# Patient Record
Sex: Female | Born: 1980
Health system: Southern US, Community
[De-identification: ages and names within clinical notes are randomized; demographics above are authoritative.]

## PROBLEM LIST (undated history)

## (undated) DIAGNOSIS — D649 Anemia, unspecified: Secondary | ICD-10-CM

## (undated) DIAGNOSIS — I10 Essential (primary) hypertension: Secondary | ICD-10-CM

## (undated) DIAGNOSIS — B20 Human immunodeficiency virus [HIV] disease: Secondary | ICD-10-CM

## (undated) DIAGNOSIS — Z21 Asymptomatic human immunodeficiency virus [HIV] infection status: Secondary | ICD-10-CM

## (undated) DIAGNOSIS — B159 Hepatitis A without hepatic coma: Secondary | ICD-10-CM

## (undated) HISTORY — DX: Anemia, unspecified: D64.9

## (undated) HISTORY — DX: Essential (primary) hypertension: I10

---

## 2005-11-28 ENCOUNTER — Ambulatory Visit: Payer: Self-pay | Admitting: Infectious Diseases

## 2005-12-21 ENCOUNTER — Encounter: Admission: RE | Admit: 2005-12-21 | Discharge: 2005-12-21 | Payer: Self-pay | Admitting: Infectious Diseases

## 2005-12-21 ENCOUNTER — Ambulatory Visit: Payer: Self-pay | Admitting: Infectious Diseases

## 2006-03-07 ENCOUNTER — Emergency Department (HOSPITAL_COMMUNITY): Admission: EM | Admit: 2006-03-07 | Discharge: 2006-03-08 | Payer: Self-pay | Admitting: Emergency Medicine

## 2006-07-24 ENCOUNTER — Encounter: Admission: RE | Admit: 2006-07-24 | Discharge: 2006-07-24 | Payer: Self-pay | Admitting: Infectious Diseases

## 2006-07-24 ENCOUNTER — Ambulatory Visit: Payer: Self-pay | Admitting: Infectious Diseases

## 2006-07-24 ENCOUNTER — Encounter (INDEPENDENT_AMBULATORY_CARE_PROVIDER_SITE_OTHER): Payer: Self-pay | Admitting: *Deleted

## 2006-07-24 LAB — CONVERTED CEMR LAB: CD4 Count: 180 microliters

## 2006-08-30 DIAGNOSIS — B20 Human immunodeficiency virus [HIV] disease: Secondary | ICD-10-CM

## 2006-12-12 DIAGNOSIS — A150 Tuberculosis of lung: Secondary | ICD-10-CM | POA: Insufficient documentation

## 2006-12-12 DIAGNOSIS — K089 Disorder of teeth and supporting structures, unspecified: Secondary | ICD-10-CM

## 2006-12-12 DIAGNOSIS — Z9119 Patient's noncompliance with other medical treatment and regimen: Secondary | ICD-10-CM

## 2006-12-18 ENCOUNTER — Encounter (INDEPENDENT_AMBULATORY_CARE_PROVIDER_SITE_OTHER): Payer: Self-pay | Admitting: *Deleted

## 2006-12-18 LAB — CONVERTED CEMR LAB: HIV 1 RNA Quant: 399 copies/mL

## 2007-03-12 ENCOUNTER — Encounter: Admission: RE | Admit: 2007-03-12 | Discharge: 2007-03-12 | Payer: Self-pay | Admitting: Infectious Diseases

## 2007-03-12 ENCOUNTER — Ambulatory Visit: Payer: Self-pay | Admitting: Infectious Diseases

## 2007-03-12 LAB — CONVERTED CEMR LAB
CD4 Count: 230 microliters
Chloride: 106 meq/L (ref 96–112)
HCT: 35 % — ABNORMAL LOW (ref 36.0–46.0)
HIV 1 RNA Quant: <50 copies/mL (ref <50)
HIV-1 RNA Quant, Log: <1.7 (ref <1.70)
MCV: 83.7 fL (ref 78.0–100.0)
Platelets: 184 10*3/uL (ref 150–400)
Potassium: 4 meq/L (ref 3.5–5.3)
RBC: 4.18 M/uL (ref 3.87–5.11)
RDW: 13.7 % (ref 11.5–14.0)
Total Bilirubin: 0.3 mg/dL (ref 0.3–1.2)
Total Protein: 9.4 g/dL — ABNORMAL HIGH (ref 6.0–8.3)
VLDL: 12 mg/dL (ref 0–40)
WBC: 2.7 10*3/uL — ABNORMAL LOW (ref 4.0–10.5)

## 2007-07-09 ENCOUNTER — Encounter: Payer: Self-pay | Admitting: Infectious Diseases

## 2007-08-17 ENCOUNTER — Encounter (INDEPENDENT_AMBULATORY_CARE_PROVIDER_SITE_OTHER): Payer: Self-pay | Admitting: *Deleted

## 2007-08-17 LAB — CONVERTED CEMR LAB: Pap Smear: ABNORMAL

## 2007-09-05 ENCOUNTER — Encounter: Admission: RE | Admit: 2007-09-05 | Discharge: 2007-09-05 | Payer: Self-pay | Admitting: Infectious Diseases

## 2007-09-05 ENCOUNTER — Ambulatory Visit: Payer: Self-pay | Admitting: Infectious Diseases

## 2007-09-05 ENCOUNTER — Telehealth: Payer: Self-pay | Admitting: Infectious Diseases

## 2007-09-05 LAB — CONVERTED CEMR LAB
ALT: 11 units/L (ref 0–35)
Alkaline Phosphatase: 51 units/L (ref 39–117)
CO2: 23 meq/L (ref 19–32)
Chloride: 102 meq/L (ref 96–112)
Creatinine, Ser: 0.64 mg/dL (ref 0.40–1.20)
Glucose, Bld: 84 mg/dL (ref 70–99)
HCT: 38.2 % (ref 36.0–46.0)
Hemoglobin: 12.1 g/dL (ref 12.0–15.0)
MCHC: 31.7 g/dL (ref 30.0–36.0)
MCV: 84.5 fL (ref 78.0–100.0)
Platelets: 177 10*3/uL (ref 150–400)
RBC: 4.52 M/uL (ref 3.87–5.11)
RDW: 13.8 % (ref 11.5–15.5)
Sodium: 135 meq/L (ref 135–145)
WBC: 2.9 10*3/uL — ABNORMAL LOW (ref 4.0–10.5)

## 2008-07-11 ENCOUNTER — Telehealth: Payer: Self-pay

## 2008-08-18 ENCOUNTER — Encounter (INDEPENDENT_AMBULATORY_CARE_PROVIDER_SITE_OTHER): Payer: Self-pay | Admitting: *Deleted

## 2008-08-18 ENCOUNTER — Ambulatory Visit: Payer: Self-pay | Admitting: Infectious Diseases

## 2008-08-18 LAB — CONVERTED CEMR LAB
AST: 26 units/L (ref 0–37)
Albumin: 3.9 g/dL (ref 3.5–5.2)
Alkaline Phosphatase: 54 units/L (ref 39–117)
Basophils Relative: 1 % (ref 0–1)
Calcium: 9.8 mg/dL (ref 8.4–10.5)
Creatinine, Ser: 0.65 mg/dL (ref 0.40–1.20)
Eosinophils Absolute: 0.1 10*3/uL (ref 0.0–0.7)
Glucose, Bld: 94 mg/dL (ref 70–99)
HCT: 35.8 % — ABNORMAL LOW (ref 36.0–46.0)
HIV-1 RNA Quant, Log: 1.72 — ABNORMAL HIGH (ref <1.70)
Hemoglobin: 11.6 g/dL — ABNORMAL LOW (ref 12.0–15.0)
Lymphocytes Relative: 35 % (ref 12–46)
Neutro Abs: 2 10*3/uL (ref 1.7–7.7)
Neutrophils Relative %: 51 % (ref 43–77)
Platelets: 173 10*3/uL (ref 150–400)
Potassium: 4.5 meq/L (ref 3.5–5.3)
Protein, ur: NEGATIVE mg/dL
Sodium: 133 meq/L — ABNORMAL LOW (ref 135–145)
Total Protein: 9.5 g/dL — ABNORMAL HIGH (ref 6.0–8.3)
Urine Glucose: NEGATIVE mg/dL
Urobilinogen, UA: 0.2 (ref 0.0–1.0)
pH: 6 (ref 5.0–8.0)

## 2008-08-19 ENCOUNTER — Encounter: Payer: Self-pay | Admitting: Infectious Diseases

## 2008-08-27 ENCOUNTER — Encounter: Payer: Self-pay | Admitting: Infectious Diseases

## 2008-09-01 ENCOUNTER — Ambulatory Visit: Payer: Self-pay | Admitting: Infectious Diseases

## 2009-02-03 ENCOUNTER — Ambulatory Visit: Payer: Self-pay | Admitting: Infectious Diseases

## 2009-02-03 LAB — CONVERTED CEMR LAB
ALT: 8 units/L (ref 0–35)
Albumin: 3.7 g/dL (ref 3.5–5.2)
BUN: 9 mg/dL (ref 6–23)
Basophils Absolute: 0 10*3/uL (ref 0.0–0.1)
Basophils Relative: 1 % (ref 0–1)
CO2: 23 meq/L (ref 19–32)
Calcium: 9.3 mg/dL (ref 8.4–10.5)
Chloride: 105 meq/L (ref 96–112)
Cholesterol: 163 mg/dL (ref 0–200)
Creatinine, Ser: 0.71 mg/dL (ref 0.40–1.20)
Eosinophils Relative: 2 % (ref 0–5)
GFR calc Af Amer: >60 mL/min (ref >60)
GFR calc non Af Amer: >60 mL/min (ref >60)
HCT: 34.7 % — ABNORMAL LOW (ref 36.0–46.0)
HIV 1 RNA Quant: 79 copies/mL — ABNORMAL HIGH (ref <48)
HIV-1 RNA Quant, Log: 1.9 — ABNORMAL HIGH (ref <1.68)
LDL Cholesterol: 99 mg/dL (ref 0–99)
Lymphs Abs: 0.9 10*3/uL (ref 0.7–4.0)
MCV: 83.8 fL (ref 78.0–100.0)
Potassium: 4.2 meq/L (ref 3.5–5.3)
RDW: 13.5 % (ref 11.5–15.5)
Triglycerides: 42 mg/dL (ref <150)
WBC: 2 10*3/uL — ABNORMAL LOW (ref 4.0–10.5)

## 2009-02-11 ENCOUNTER — Ambulatory Visit: Payer: Self-pay | Admitting: Infectious Diseases

## 2009-02-12 ENCOUNTER — Encounter (INDEPENDENT_AMBULATORY_CARE_PROVIDER_SITE_OTHER): Payer: Self-pay | Admitting: *Deleted

## 2009-03-03 ENCOUNTER — Encounter: Payer: Self-pay | Admitting: Infectious Diseases

## 2009-03-03 ENCOUNTER — Encounter (INDEPENDENT_AMBULATORY_CARE_PROVIDER_SITE_OTHER): Payer: Self-pay | Admitting: *Deleted

## 2009-03-03 LAB — CONVERTED CEMR LAB: Pap Smear: ABNORMAL

## 2009-06-10 ENCOUNTER — Ambulatory Visit: Payer: Self-pay | Admitting: Infectious Diseases

## 2009-06-10 LAB — CONVERTED CEMR LAB
AST: 20 units/L (ref 0–37)
Alkaline Phosphatase: 53 units/L (ref 39–117)
Basophils Absolute: 0 10*3/uL (ref 0.0–0.1)
Basophils Relative: 1 % (ref 0–1)
CO2: 24 meq/L (ref 19–32)
Calcium: 9 mg/dL (ref 8.4–10.5)
Creatinine, Ser: 0.73 mg/dL (ref 0.40–1.20)
HCT: 34.8 % — ABNORMAL LOW (ref 36.0–46.0)
HIV 1 RNA Quant: 218 copies/mL — ABNORMAL HIGH (ref <48)
Hemoglobin: 11 g/dL — ABNORMAL LOW (ref 12.0–15.0)
Lymphs Abs: 0.7 10*3/uL (ref 0.7–4.0)
MCV: 84.1 fL (ref >=78.0)
Neutro Abs: 0.9 10*3/uL — ABNORMAL LOW (ref 1.7–7.7)
Neutrophils Relative %: 46 % (ref 43–77)
RBC: 4.14 M/uL (ref 3.87–5.11)
RDW: 14.4 % (ref 11.5–15.5)
Sodium: 136 meq/L (ref 135–145)
Total Bilirubin: 0.2 mg/dL — ABNORMAL LOW (ref 0.3–1.2)

## 2009-10-20 ENCOUNTER — Encounter (INDEPENDENT_AMBULATORY_CARE_PROVIDER_SITE_OTHER): Payer: Self-pay | Admitting: *Deleted

## 2009-11-18 ENCOUNTER — Ambulatory Visit: Payer: Self-pay | Admitting: Infectious Diseases

## 2009-11-18 LAB — CONVERTED CEMR LAB
ALT: 13 units/L (ref 0–35)
AST: 20 units/L (ref 0–37)
Albumin: 3.5 g/dL (ref 3.5–5.2)
Alkaline Phosphatase: 56 units/L (ref 39–117)
BUN: 8 mg/dL (ref 6–23)
Basophils Absolute: 0 10*3/uL (ref 0.0–0.1)
Basophils Relative: 1 % (ref 0–1)
CO2: 24 meq/L (ref 19–32)
Chloride: 104 meq/L (ref 96–112)
Eosinophils Absolute: 0.1 10*3/uL (ref 0.0–0.7)
Eosinophils Relative: 2 % (ref 0–5)
Glucose, Bld: 85 mg/dL (ref 70–99)
HCT: 33.1 % — ABNORMAL LOW (ref 36.0–46.0)
HIV 1 RNA Quant: 170 copies/mL — ABNORMAL HIGH (ref <48)
Lymphocytes Relative: 45 % (ref 12–46)
MCHC: 32.3 g/dL (ref 30.0–36.0)
Monocytes Relative: 21 % — ABNORMAL HIGH (ref 3–12)
Neutro Abs: 0.8 10*3/uL — ABNORMAL LOW (ref 1.7–7.7)
Neutrophils Relative %: 32 % — ABNORMAL LOW (ref 43–77)
Potassium: 4.2 meq/L (ref 3.5–5.3)
Sodium: 134 meq/L — ABNORMAL LOW (ref 135–145)
Total Bilirubin: 0.3 mg/dL (ref 0.3–1.2)
WBC: 2.5 10*3/uL — ABNORMAL LOW (ref 4.0–10.5)

## 2010-03-01 ENCOUNTER — Ambulatory Visit (HOSPITAL_COMMUNITY): Admission: RE | Admit: 2010-03-01 | Discharge: 2010-03-01 | Payer: Self-pay | Admitting: Infectious Diseases

## 2010-03-01 ENCOUNTER — Ambulatory Visit: Payer: Self-pay | Admitting: Infectious Diseases

## 2010-03-01 LAB — CONVERTED CEMR LAB
ALT: 13 units/L (ref 0–35)
AST: 21 units/L (ref 0–37)
Albumin: 3.5 g/dL (ref 3.5–5.2)
Alkaline Phosphatase: 62 units/L (ref 39–117)
BUN: 9 mg/dL (ref 6–23)
Basophils Absolute: 0 10*3/uL (ref 0.0–0.1)
Basophils Relative: 1 % (ref 0–1)
CO2: 23 meq/L (ref 19–32)
Chloride: 102 meq/L (ref 96–112)
Creatinine, Ser: 0.68 mg/dL (ref 0.40–1.20)
Eosinophils Relative: 3 % (ref 0–5)
Glucose, Bld: 74 mg/dL (ref 70–99)
Lymphs Abs: 1.1 10*3/uL (ref 0.7–4.0)
MCV: 83.7 fL (ref 78.0–100.0)
Monocytes Absolute: 0.5 10*3/uL (ref 0.1–1.0)
Neutro Abs: 1.2 10*3/uL — ABNORMAL LOW (ref 1.7–7.7)
Platelets: 188 10*3/uL (ref 150–400)
Potassium: 3.7 meq/L (ref 3.5–5.3)
RBC: 4.11 M/uL (ref 3.87–5.11)
RDW: 14 % (ref 11.5–15.5)
Sodium: 134 meq/L — ABNORMAL LOW (ref 135–145)
Total Bilirubin: 0.2 mg/dL — ABNORMAL LOW (ref 0.3–1.2)
WBC: 2.9 10*3/uL — ABNORMAL LOW (ref 4.0–10.5)

## 2010-03-26 ENCOUNTER — Telehealth (INDEPENDENT_AMBULATORY_CARE_PROVIDER_SITE_OTHER): Payer: Self-pay | Admitting: *Deleted

## 2010-03-26 ENCOUNTER — Encounter (INDEPENDENT_AMBULATORY_CARE_PROVIDER_SITE_OTHER): Payer: Self-pay | Admitting: *Deleted

## 2010-09-30 ENCOUNTER — Emergency Department (HOSPITAL_COMMUNITY): Admission: EM | Admit: 2010-09-30 | Discharge: 2010-02-04 | Payer: Self-pay | Admitting: Emergency Medicine

## 2010-11-23 NOTE — Progress Notes (Signed)
Summary: reminder to call [facility] to f/u abnl PAP  Phone Note Outgoing Call   Call placed by: Jennet Maduro RN,  March 26, 2010 12:48 PM Call placed to: Patient Action Taken: Phone Call Completed Summary of Call: Message left reminding pt. to make appt. @ [facility] for annual appt and abnl PAP f/u. Jennet Maduro RN  March 26, 2010 12:49 PM

## 2010-11-23 NOTE — Assessment & Plan Note (Signed)
Summary: CHECKUP/SB.   CC:  check up.  History of Present Illness: 30 yo F with hx of HIV-2+, abn PAP and PPD+. On ATVr/TRV.   CD4 220, VL 170 (11-18-09).  feeling well. has not made f/uw tih GYN yet for prev abn pap. no problems with meds, no missed. recent course of cipro for UTI. still has some R lower abd/back pain and swelling. no dysuria. nl BM.   Preventive Screening-Counseling & Management  Alcohol-Tobacco     Alcohol drinks/day: 0     Smoking Status: never     Passive Smoke Exposure: no  Caffeine-Diet-Exercise     Caffeine use/day: sodas occassionally     Does Patient Exercise: yes     Type of exercise: active at work     Exercise (avg: min/session): >60     Times/week: 6   Updated Prior Medication List: NORVIR 100 MG CAPS (RITONAVIR) Take 1 capsule by mouth once a day REYATAZ 300 MG CAPS (ATAZANAVIR SULFATE) Take 1 capsule by mouth once a day TRUVADA 200-300 MG TABS (EMTRICITABINE-TENOFOVIR) Take 1 tablet by mouth once a day PRILOSEC 20 MG CPDR (OMEPRAZOLE) Take 1 tablet by mouth two times a day IRON 325 (65 FE) MG TABS (FERROUS SULFATE) Take 1 tablet by mouth once a day]  Current Allergies (reviewed today): No known allergies  Past History:  Past medical, surgical, family and social histories (including risk factors) reviewed, and no changes noted (except as noted below).  Past Medical History: Reviewed history from 08/30/2006 and no changes required. HIV disease Latent tuberculosis  Family History: Reviewed history from 09/01/2008 and no changes required. no problems  Social History: Reviewed history from 08/30/2006 and no changes required. Never Smoked Alcohol use-no From [place], been in [place] since Feb 2007.   Current Medications (verified): 1)  Norvir 100 Mg Caps (Ritonavir) .... Take 1 Capsule By Mouth Once A Day 2)  Reyataz 300 Mg Caps (Atazanavir Sulfate) .... Take 1 Capsule By Mouth Once A Day 3)  Truvada 200-300 Mg Tabs  (Emtricitabine-Tenofovir) .... Take 1 Tablet By Mouth Once A Day 4)  Prilosec 20 Mg Cpdr (Omeprazole) .... Take 1 Tablet By Mouth Two Times A Day 5)  Iron 325 (65 Fe) Mg Tabs (Ferrous Sulfate) .... Take 1 Tablet By Mouth Once A Day]  Allergies (verified): No Known Drug Allergies   Review of Systems       occas fevers. wt up 5#  Vital Signs:  Patient profile:   30 year old female Height:      64 inches (162.56 cm) Weight:      133.4 pounds (60.64 kg) BMI:     22.98 Temp:     98.1 degrees F (36.72 degrees C) oral Pulse rate:   99 / minute BP sitting:   115 / 80  (left arm)  Vitals Entered By: Baxter Hire) (Mar 01, 2010 10:50 AM) CC: check up Pain Assessment Patient in pain? yes     Location: lower back Intensity: okay right now Type: aching Onset of pain  pain comes and goes Nutritional Status BMI of 19 -24 = normal Nutritional Status Detail appetite is good per patient  Does patient need assistance? Functional Status Self care Ambulation Normal        Medication Adherence: 03/01/2010   Adherence to medications reviewed with patient. Counseling to provide adequate adherence provided   Prevention For Positives: 03/01/2010   Safe sex practices discussed with patient. Condoms offered.  Physical Exam  General:  well-developed, well-nourished, and well-hydrated.   Eyes:  pupils equal, pupils round, and pupils reactive to light.   Mouth:  pharynx pink and moist, no exudates, and poor dentition.   Neck:  no masses.   Lungs:  normal respiratory effort and normal breath sounds.   Heart:  normal rate, regular rhythm, and no murmur.   Abdomen:  soft, non-tender, and normal bowel sounds.  no flank pain or swelling on R.    Impression & Recommendations:  Problem # 1:  HIV DISEASE (ICD-042)  she is doing well. will rechek her labs today. will give her condoms. asked ehr to take ATVr 12 hours apart from prilosec. will check a  plain film of her LS spine to try and further illucidate her back pain. return to clinic 4 months.   Orders: Radiology other (Radiology Other)  Problem # 2:  DENTAL CARIES (ICD-521.00) will try to get her into dental.   Problem # 3:  ABFND, PAP SMEAR, CERVIX, AS-CUS (ICD-795.01) will encourage her to make her f/u apt.   Other Orders: T-CD4SP [facility]) (CD4SP) T-HIV Viral Load [phone]) T-Comprehensive Metabolic Panel [phone]) T-CBC w/Diff [phone]) Est. Patient Level IV (13244)  Appended Document: Orders Update    Clinical Lists Changes  Orders: Added new Test order of T-Hepatitis A Antibody ([id number]) - Signed      Appended Document: Orders Update    Clinical Lists Changes  Orders: Added new Test order of Radiology other (Radiology Other) - Signed

## 2010-11-23 NOTE — Miscellaneous (Signed)
Summary: Update Problem LIst  Clinical Lists Changes  Problems: Removed problem of ABFND, PAP SMEAR, CERVIX, AS-CUS (ICD-795.01) Added new problem of DYSPLASIA OF CERVIX UNSPECIFIED (ICD-622.10)

## 2010-11-23 NOTE — Consult Note (Signed)
Summary: [facility]  [facility]   Imported By: Florinda Marker 11/04/2009 15:35:15  _____________________________________________________________________  External Attachment:    Type:   Image     Comment:   External Document

## 2010-11-23 NOTE — Miscellaneous (Signed)
Summary: Orders Update - labs  Clinical Lists Changes  Problems: Added new problem of ENCOUNTER FOR LONG-TERM USE OF OTHER MEDICATIONS (ICD-V58.69) Orders: Added new Test order of T-CBC w/Diff [phone]) - Signed Added new Test order of T-CD4SP [facility]) (CD4SP) - Signed Added new Test order of T-Comprehensive Metabolic Panel [phone]) - Signed Added new Test order of T-HIV Viral Load [phone]) - Signed Added new Test order of T-RPR (Syphilis) [phone]) - Signed Added new Test order of T-Lipid Profile ([id number]) - Signed     Process Orders Check Orders Results:     [facility]: ABN not required for this insurance Order queued for requisitioning for [facility]: November 18, 2009 10:11 AM  Tests Sent for requisitioning (November 18, 2009 10:11 AM):     11/18/2009: [facility] -- T-CBC w/Diff [[id number]] (signed)     11/18/2009: [facility] -- T-Comprehensive Metabolic Panel [[id number]] (signed)     11/18/2009: [facility] -- T-HIV Viral Load [phone] (signed)     11/18/2009: [facility] -- T-RPR (Syphilis) [phone] (signed)     11/18/2009: [facility] -- T-Lipid Profile [phone] (signed)

## 2010-12-14 ENCOUNTER — Encounter (INDEPENDENT_AMBULATORY_CARE_PROVIDER_SITE_OTHER): Payer: Self-pay | Admitting: Licensed Clinical Social Worker

## 2010-12-21 NOTE — Miscellaneous (Signed)
  Clinical Lists Changes  Observations: Added new observation of FLU VAX: Historical (10/08/2010 10:34)      Immunization History:  Influenza Immunization History:    Influenza:  historical (10/08/2010)

## 2011-01-09 LAB — T-HELPER CELL (CD4) - (RCID CLINIC ONLY)
CD4 % Helper T Cell: 22 % — ABNORMAL LOW (ref 33–55)
CD4 T Cell Abs: 220 uL — ABNORMAL LOW (ref 400–2700)

## 2011-01-11 LAB — T-HELPER CELL (CD4) - (RCID CLINIC ONLY): CD4 % Helper T Cell: 19 % — ABNORMAL LOW (ref 33–55)

## 2011-01-12 LAB — BASIC METABOLIC PANEL
CO2: 25 mEq/L (ref 19–32)
Calcium: 9.2 mg/dL (ref 8.4–10.5)
Creatinine, Ser: 0.76 mg/dL (ref 0.4–1.2)
Potassium: 3.7 mEq/L (ref 3.5–5.1)

## 2011-01-12 LAB — WET PREP, GENITAL
Trich, Wet Prep: NONE SEEN
Yeast Wet Prep HPF POC: NONE SEEN

## 2011-01-12 LAB — URINALYSIS, ROUTINE W REFLEX MICROSCOPIC
Bilirubin Urine: NEGATIVE
Glucose, UA: NEGATIVE mg/dL
Nitrite: NEGATIVE
Protein, ur: NEGATIVE mg/dL
Specific Gravity, Urine: 1.017 (ref 1.005–1.030)
pH: 6 (ref 5.0–8.0)

## 2011-01-12 LAB — CBC
HCT: 31.7 % — ABNORMAL LOW (ref 36.0–46.0)
Hemoglobin: 10.8 g/dL — ABNORMAL LOW (ref 12.0–15.0)
MCHC: 34.1 g/dL (ref 30.0–36.0)
Platelets: 167 10*3/uL (ref 150–400)
RBC: 3.73 MIL/uL — ABNORMAL LOW (ref 3.87–5.11)
RDW: 13.8 % (ref 11.5–15.5)

## 2011-01-12 LAB — DIFFERENTIAL
Basophils Absolute: 0 10*3/uL (ref 0.0–0.1)
Lymphocytes Relative: 46 % (ref 12–46)
Monocytes Relative: 18 % — ABNORMAL HIGH (ref 3–12)
Neutro Abs: 1 10*3/uL — ABNORMAL LOW (ref 1.7–7.7)

## 2011-01-12 LAB — POCT PREGNANCY, URINE: Preg Test, Ur: NEGATIVE

## 2011-01-12 LAB — HEPATIC FUNCTION PANEL
AST: 28 U/L (ref 0–37)
Bilirubin, Direct: <0.1 mg/dL (ref 0.0–0.3)
Total Protein: 9.5 g/dL — ABNORMAL HIGH (ref 6.0–8.3)

## 2011-01-12 LAB — URINE MICROSCOPIC-ADD ON

## 2011-01-12 LAB — GC/CHLAMYDIA PROBE AMP, GENITAL: GC Probe Amp, Genital: NEGATIVE

## 2011-01-29 LAB — T-HELPER CELL (CD4) - (RCID CLINIC ONLY): CD4 T Cell Abs: 220 uL — ABNORMAL LOW (ref 400–2700)

## 2011-02-02 LAB — T-HELPER CELL (CD4) - (RCID CLINIC ONLY): CD4 % Helper T Cell: 28 % — ABNORMAL LOW (ref 33–55)

## 2011-05-24 ENCOUNTER — Other Ambulatory Visit (HOSPITAL_COMMUNITY)
Admission: RE | Admit: 2011-05-24 | Discharge: 2011-05-24 | Disposition: A | Discharge status: Home / Self Care | Payer: PRIVATE HEALTH INSURANCE | Source: Ambulatory Visit | Attending: Family Medicine | Admitting: Family Medicine

## 2011-05-24 ENCOUNTER — Other Ambulatory Visit: Payer: Self-pay | Admitting: Family Medicine

## 2011-05-24 DIAGNOSIS — R87810 Cervical high risk human papillomavirus (HPV) DNA test positive: Secondary | ICD-10-CM | POA: Insufficient documentation

## 2011-05-24 DIAGNOSIS — Z113 Encounter for screening for infections with a predominantly sexual mode of transmission: Secondary | ICD-10-CM | POA: Insufficient documentation

## 2011-05-24 DIAGNOSIS — Z124 Encounter for screening for malignant neoplasm of cervix: Secondary | ICD-10-CM | POA: Insufficient documentation

## 2011-06-02 ENCOUNTER — Other Ambulatory Visit: Payer: Self-pay | Admitting: Family Medicine

## 2011-07-05 ENCOUNTER — Other Ambulatory Visit: Payer: Self-pay | Admitting: Obstetrics and Gynecology

## 2011-07-25 LAB — T-HELPER CELL (CD4) - (RCID CLINIC ONLY)
CD4 % Helper T Cell: 22 — ABNORMAL LOW
CD4 T Cell Abs: 270 — ABNORMAL LOW

## 2011-08-02 LAB — T-HELPER CELL (CD4) - (RCID CLINIC ONLY): CD4 % Helper T Cell: 18 — ABNORMAL LOW

## 2011-09-01 ENCOUNTER — Other Ambulatory Visit: Payer: Self-pay | Admitting: Obstetrics and Gynecology

## 2015-11-27 ENCOUNTER — Inpatient Hospital Stay (HOSPITAL_COMMUNITY)
Admission: EM | Admit: 2015-11-27 | Discharge: 2015-12-03 | DRG: 976 | Disposition: A | Discharge status: Home / Self Care | Payer: BLUE CROSS/BLUE SHIELD | Attending: Internal Medicine | Admitting: Internal Medicine

## 2015-11-27 ENCOUNTER — Emergency Department (HOSPITAL_COMMUNITY): Payer: BLUE CROSS/BLUE SHIELD

## 2015-11-27 ENCOUNTER — Encounter (HOSPITAL_COMMUNITY): Payer: Self-pay | Admitting: Emergency Medicine

## 2015-11-27 ENCOUNTER — Inpatient Hospital Stay (HOSPITAL_COMMUNITY): Payer: BLUE CROSS/BLUE SHIELD

## 2015-11-27 DIAGNOSIS — A312 Disseminated mycobacterium avium-intracellulare complex (DMAC): Secondary | ICD-10-CM | POA: Diagnosis present

## 2015-11-27 DIAGNOSIS — M549 Dorsalgia, unspecified: Secondary | ICD-10-CM | POA: Diagnosis present

## 2015-11-27 DIAGNOSIS — K829 Disease of gallbladder, unspecified: Secondary | ICD-10-CM

## 2015-11-27 DIAGNOSIS — B20 Human immunodeficiency virus [HIV] disease: Principal | ICD-10-CM | POA: Diagnosis present

## 2015-11-27 DIAGNOSIS — R945 Abnormal results of liver function studies: Secondary | ICD-10-CM

## 2015-11-27 DIAGNOSIS — K819 Cholecystitis, unspecified: Secondary | ICD-10-CM

## 2015-11-27 DIAGNOSIS — B9735 Human immunodeficiency virus, type 2 [HIV 2] as the cause of diseases classified elsewhere: Secondary | ICD-10-CM | POA: Insufficient documentation

## 2015-11-27 DIAGNOSIS — B373 Candidiasis of vulva and vagina: Secondary | ICD-10-CM | POA: Diagnosis present

## 2015-11-27 DIAGNOSIS — R935 Abnormal findings on diagnostic imaging of other abdominal regions, including retroperitoneum: Secondary | ICD-10-CM

## 2015-11-27 DIAGNOSIS — R109 Unspecified abdominal pain: Secondary | ICD-10-CM

## 2015-11-27 DIAGNOSIS — R509 Fever, unspecified: Secondary | ICD-10-CM

## 2015-11-27 DIAGNOSIS — M791 Myalgia: Secondary | ICD-10-CM | POA: Diagnosis present

## 2015-11-27 DIAGNOSIS — Z9119 Patient's noncompliance with other medical treatment and regimen: Secondary | ICD-10-CM | POA: Diagnosis not present

## 2015-11-27 DIAGNOSIS — B370 Candidal stomatitis: Secondary | ICD-10-CM | POA: Diagnosis present

## 2015-11-27 DIAGNOSIS — B3731 Acute candidiasis of vulva and vagina: Secondary | ICD-10-CM

## 2015-11-27 DIAGNOSIS — R7989 Other specified abnormal findings of blood chemistry: Secondary | ICD-10-CM | POA: Diagnosis present

## 2015-11-27 DIAGNOSIS — Z21 Asymptomatic human immunodeficiency virus [HIV] infection status: Secondary | ICD-10-CM

## 2015-11-27 DIAGNOSIS — Z8611 Personal history of tuberculosis: Secondary | ICD-10-CM

## 2015-11-27 DIAGNOSIS — D509 Iron deficiency anemia, unspecified: Secondary | ICD-10-CM | POA: Diagnosis present

## 2015-11-27 DIAGNOSIS — D649 Anemia, unspecified: Secondary | ICD-10-CM

## 2015-11-27 DIAGNOSIS — N814 Uterovaginal prolapse, unspecified: Secondary | ICD-10-CM | POA: Diagnosis present

## 2015-11-27 DIAGNOSIS — Z79899 Other long term (current) drug therapy: Secondary | ICD-10-CM

## 2015-11-27 DIAGNOSIS — K759 Inflammatory liver disease, unspecified: Secondary | ICD-10-CM | POA: Diagnosis not present

## 2015-11-27 HISTORY — DX: Asymptomatic human immunodeficiency virus (hiv) infection status: Z21

## 2015-11-27 HISTORY — DX: Human immunodeficiency virus (HIV) disease: B20

## 2015-11-27 HISTORY — DX: Hepatitis a without hepatic coma: B15.9

## 2015-11-27 LAB — URINALYSIS, ROUTINE W REFLEX MICROSCOPIC
GLUCOSE, UA: NEGATIVE mg/dL
KETONES UR: NEGATIVE mg/dL
Nitrite: NEGATIVE
PH: 6.5 (ref 5.0–8.0)
PROTEIN: 100 mg/dL — AB
Specific Gravity, Urine: 1.025 (ref 1.005–1.030)

## 2015-11-27 LAB — URINE MICROSCOPIC-ADD ON

## 2015-11-27 LAB — COMPREHENSIVE METABOLIC PANEL
ALT: 150 U/L — AB (ref 14–54)
AST: 248 U/L — ABNORMAL HIGH (ref 15–41)
Albumin: 2.2 g/dL — ABNORMAL LOW (ref 3.5–5.0)
Alkaline Phosphatase: 261 U/L — ABNORMAL HIGH (ref 38–126)
Anion gap: 8 (ref 5–15)
BILIRUBIN TOTAL: 0.5 mg/dL (ref 0.3–1.2)
BUN: 7 mg/dL (ref 6–20)
CHLORIDE: 103 mmol/L (ref 101–111)
CO2: 20 mmol/L — ABNORMAL LOW (ref 22–32)
CREATININE: 0.8 mg/dL (ref 0.44–1.00)
Calcium: 8.5 mg/dL — ABNORMAL LOW (ref 8.9–10.3)
Glucose, Bld: 151 mg/dL — ABNORMAL HIGH (ref 65–99)
Potassium: 3.2 mmol/L — ABNORMAL LOW (ref 3.5–5.1)
Sodium: 131 mmol/L — ABNORMAL LOW (ref 135–145)
TOTAL PROTEIN: 10.8 g/dL — AB (ref 6.5–8.1)

## 2015-11-27 LAB — CBC WITH DIFFERENTIAL/PLATELET
Basophils Absolute: 0 10*3/uL (ref 0.0–0.1)
Basophils Relative: 0 %
EOS PCT: 0 %
Eosinophils Absolute: 0 10*3/uL (ref 0.0–0.7)
HEMATOCRIT: 27.6 % — AB (ref 36.0–46.0)
Hemoglobin: 9.2 g/dL — ABNORMAL LOW (ref 12.0–15.0)
LYMPHS ABS: 0.8 10*3/uL (ref 0.7–4.0)
LYMPHS PCT: 23 %
MCH: 26.3 pg (ref 26.0–34.0)
MCHC: 33.3 g/dL (ref 30.0–36.0)
MCV: 78.9 fL (ref 78.0–100.0)
MONO ABS: 0.3 10*3/uL (ref 0.1–1.0)
Monocytes Relative: 8 %
Neutro Abs: 2.3 10*3/uL (ref 1.7–7.7)
Neutrophils Relative %: 69 %
PLATELETS: 235 10*3/uL (ref 150–400)
RBC: 3.5 MIL/uL — ABNORMAL LOW (ref 3.87–5.11)
RDW: 13.9 % (ref 11.5–15.5)
WBC: 3.3 10*3/uL — ABNORMAL LOW (ref 4.0–10.5)

## 2015-11-27 LAB — CRYPTOCOCCAL ANTIGEN: CRYPTO AG: NEGATIVE

## 2015-11-27 LAB — T-HELPER CELLS (CD4) COUNT (NOT AT ARMC)
CD4 % Helper T Cell: 7 % — ABNORMAL LOW (ref 33–55)
CD4 T Cell Abs: 60 /uL — ABNORMAL LOW (ref 400–2700)

## 2015-11-27 LAB — INFLUENZA PANEL BY PCR (TYPE A & B)
H1N1 flu by pcr: NOT DETECTED
INFLBPCR: NEGATIVE
Influenza A By PCR: NEGATIVE

## 2015-11-27 LAB — I-STAT CG4 LACTIC ACID, ED
LACTIC ACID, VENOUS: 3.39 mmol/L — AB (ref 0.5–2.0)
LACTIC ACID, VENOUS: 0.77 mmol/L (ref 0.5–2.0)

## 2015-11-27 LAB — I-STAT BETA HCG BLOOD, ED (MC, WL, AP ONLY)

## 2015-11-27 MED ORDER — PIPERACILLIN-TAZOBACTAM 3.375 G IVPB 30 MIN
3.3750 g | Freq: Once | INTRAVENOUS | Status: AC
  Administered 2015-11-27: 3.375 g via INTRAVENOUS
  Filled 2015-11-27: qty 50

## 2015-11-27 MED ORDER — SODIUM CHLORIDE 0.9 % IV BOLUS (SEPSIS)
1000.0000 mL | Freq: Once | INTRAVENOUS | Status: AC
  Administered 2015-11-27: 1000 mL via INTRAVENOUS

## 2015-11-27 MED ORDER — ENOXAPARIN SODIUM 40 MG/0.4ML ~~LOC~~ SOLN
40.0000 mg | SUBCUTANEOUS | Status: DC
  Administered 2015-11-27 – 2015-11-30 (×4): 40 mg via SUBCUTANEOUS
  Filled 2015-11-27 (×4): qty 0.4

## 2015-11-27 MED ORDER — VANCOMYCIN HCL IN DEXTROSE 750-5 MG/150ML-% IV SOLN
750.0000 mg | Freq: Three times a day (TID) | INTRAVENOUS | Status: DC
  Filled 2015-11-27 (×2): qty 150

## 2015-11-27 MED ORDER — VANCOMYCIN HCL IN DEXTROSE 1-5 GM/200ML-% IV SOLN
1000.0000 mg | Freq: Once | INTRAVENOUS | Status: AC
  Administered 2015-11-27: 1000 mg via INTRAVENOUS
  Filled 2015-11-27: qty 200

## 2015-11-27 MED ORDER — POTASSIUM CHLORIDE CRYS ER 20 MEQ PO TBCR
40.0000 meq | EXTENDED_RELEASE_TABLET | Freq: Once | ORAL | Status: AC
  Administered 2015-11-27: 40 meq via ORAL
  Filled 2015-11-27: qty 2

## 2015-11-27 MED ORDER — IBUPROFEN 200 MG PO TABS
400.0000 mg | ORAL_TABLET | Freq: Four times a day (QID) | ORAL | Status: DC | PRN
  Administered 2015-11-27 – 2015-11-30 (×6): 400 mg via ORAL
  Filled 2015-11-27 (×2): qty 2
  Filled 2015-11-27: qty 1
  Filled 2015-11-27 (×3): qty 2

## 2015-11-27 MED ORDER — SULFAMETHOXAZOLE-TRIMETHOPRIM 800-160 MG PO TABS
1.0000 | ORAL_TABLET | Freq: Every day | ORAL | Status: DC
  Administered 2015-11-28 – 2015-12-03 (×6): 1 via ORAL
  Filled 2015-11-27 (×6): qty 1

## 2015-11-27 MED ORDER — ACETAMINOPHEN 325 MG PO TABS
650.0000 mg | ORAL_TABLET | Freq: Once | ORAL | Status: AC
  Administered 2015-11-27: 650 mg via ORAL
  Filled 2015-11-27: qty 2

## 2015-11-27 MED ORDER — PIPERACILLIN-TAZOBACTAM 3.375 G IVPB
3.3750 g | Freq: Three times a day (TID) | INTRAVENOUS | Status: DC
  Administered 2015-11-27 – 2015-12-01 (×12): 3.375 g via INTRAVENOUS
  Filled 2015-11-27 (×14): qty 50

## 2015-11-27 MED ORDER — SODIUM CHLORIDE 0.9 % IV SOLN
INTRAVENOUS | Status: AC
  Administered 2015-11-27: 18:00:00 via INTRAVENOUS

## 2015-11-27 MED ORDER — FLUCONAZOLE 200 MG PO TABS
200.0000 mg | ORAL_TABLET | Freq: Every day | ORAL | Status: DC
  Administered 2015-11-27 – 2015-12-01 (×5): 200 mg via ORAL
  Filled 2015-11-27 (×6): qty 1

## 2015-11-27 NOTE — Consult Note (Signed)
Reason for Consult: acute cholecystitis  Referring Physician: Dr. Oval Linsey    HPI: Allison Wilson is a 35 year old female with a history of HIV who presents with back and shoulder pain.  Onset was sudden.  Coarse is unchanged.  exacerbated when she vacuum's.  No increase with oral intake or post prandial pain.  Denies nausea or vomiting.  Endorses to fever, chills, sweats, and chills.  Denies previous symptoms.  Initial work up shows, elevated LFTs with AST/ALT 248/150, normal t bilirubin, WBC 3.3.  CD4 count of 60.  Abdominal US shows no gallstones, but gallbladder wall thickening, edema and a sonographic murphy's sign.  We have therefore been asked to evaluate.    Past Medical History  Diagnosis Date  . HIV (human immunodeficiency virus infection) ([place])   . Hepatitis A     History reviewed. No pertinent past surgical history.  History reviewed. No pertinent family history.  Social History:  reports that she has never smoked. She does not have any smokeless tobacco history on file. She reports that she does not drink alcohol or use illicit drugs.  Allergies: No Known Allergies  Medications:  Scheduled Meds: . enoxaparin (LOVENOX) injection  40 mg Subcutaneous Q24H  . piperacillin-tazobactam (ZOSYN)  IV  3.375 g Intravenous Q8H  . vancomycin  750 mg Intravenous Q8H   Continuous Infusions: . sodium chloride     PRN Meds:.ibuprofen   Results for orders placed or performed during the hospital encounter of 11/27/15 (from the past 48 hour(s))  Influenza panel by PCR (type A & B, H1N1)     Status: None   Collection Time: 11/27/15  9:01 AM  Result Value Ref Range   Influenza A By PCR NEGATIVE NEGATIVE   Influenza B By PCR NEGATIVE NEGATIVE   H1N1 flu by pcr NOT DETECTED NOT DETECTED    Comment:        The Xpert Flu assay (FDA approved for nasal aspirates or washes and nasopharyngeal swab specimens), is intended as an aid in the diagnosis of influenza and should not be used  as a sole basis for treatment.   CBC with Differential     Status: Abnormal   Collection Time: 11/27/15  9:06 AM  Result Value Ref Range   WBC 3.3 (L) 4.0 - 10.5 K/uL   RBC 3.50 (L) 3.87 - 5.11 MIL/uL   Hemoglobin 9.2 (L) 12.0 - 15.0 g/dL   HCT 27.6 (L) 36.0 - 46.0 %   MCV 78.9 78.0 - 100.0 fL   MCH 26.3 26.0 - 34.0 pg   MCHC 33.3 30.0 - 36.0 g/dL   RDW 13.9 11.5 - 15.5 %   Platelets 235 150 - 400 K/uL   Neutrophils Relative % 69 %   Neutro Abs 2.3 1.7 - 7.7 K/uL   Lymphocytes Relative 23 %   Lymphs Abs 0.8 0.7 - 4.0 K/uL   Monocytes Relative 8 %   Monocytes Absolute 0.3 0.1 - 1.0 K/uL   Eosinophils Relative 0 %   Eosinophils Absolute 0.0 0.0 - 0.7 K/uL   Basophils Relative 0 %   Basophils Absolute 0.0 0.0 - 0.1 K/uL  Comprehensive metabolic panel     Status: Abnormal   Collection Time: 11/27/15  9:06 AM  Result Value Ref Range   Sodium 131 (L) 135 - 145 mmol/L   Potassium 3.2 (L) 3.5 - 5.1 mmol/L   Chloride 103 101 - 111 mmol/L   CO2 20 (L) 22 - 32 mmol/L  Glucose, Bld 151 (H) 65 - 99 mg/dL   BUN 7 6 - 20 mg/dL   Creatinine, Ser 0.80 0.44 - 1.00 mg/dL   Calcium 8.5 (L) 8.9 - 10.3 mg/dL   Total Protein 10.8 (H) 6.5 - 8.1 g/dL   Albumin 2.2 (L) 3.5 - 5.0 g/dL   AST 248 (H) 15 - 41 U/L   ALT 150 (H) 14 - 54 U/L   Alkaline Phosphatase 261 (H) 38 - 126 U/L   Total Bilirubin 0.5 0.3 - 1.2 mg/dL   GFR calc non Af Amer >60 >60 mL/min   GFR calc Af Amer >60 >60 mL/min    Comment: (NOTE) The eGFR has been calculated using the CKD EPI equation. This calculation has not been validated in all clinical situations. eGFR's persistently <60 mL/min signify possible Chronic Kidney Disease.    Anion gap 8 5 - 15  T-helper cells (CD4) count (not at [facility])     Status: Abnormal   Collection Time: 11/27/15  9:06 AM  Result Value Ref Range   CD4 T Cell Abs 60 (L) 400 - 2700 /uL   CD4 % Helper T Cell 7 (L) 33 - 55 %    Comment: Performed at [facility]  I-Stat Beta  hCG blood, ED ([facility], [facility], [facility] only)     Status: None   Collection Time: 11/27/15  9:07 AM  Result Value Ref Range   I-stat hCG, quantitative <5.0 <5 mIU/mL   Comment 3            Comment:   GEST. AGE      CONC.  (mIU/mL)   <=1 WEEK        5 - 50     2 WEEKS       50 - 500     3 WEEKS       100 - 10,000     4 WEEKS     1,000 - 30,000        FEMALE AND NON-PREGNANT FEMALE:     LESS THAN 5 mIU/mL   I-Stat CG4 Lactic Acid, ED     Status: Abnormal   Collection Time: 11/27/15  9:09 AM  Result Value Ref Range   Lactic Acid, Venous 3.39 (HH) 0.5 - 2.0 mmol/L   Comment NOTIFIED PHYSICIAN   Urinalysis, Routine w reflex microscopic (not at [facility])     Status: Abnormal   Collection Time: 11/27/15  9:15 AM  Result Value Ref Range   Color, Urine AMBER (A) YELLOW    Comment: BIOCHEMICALS MAY BE AFFECTED BY COLOR   APPearance CLOUDY (A) CLEAR   Specific Gravity, Urine 1.025 1.005 - 1.030   pH 6.5 5.0 - 8.0   Glucose, UA NEGATIVE NEGATIVE mg/dL   Hgb urine dipstick SMALL (A) NEGATIVE   Bilirubin Urine SMALL (A) NEGATIVE   Ketones, ur NEGATIVE NEGATIVE mg/dL   Protein, ur 100 (A) NEGATIVE mg/dL   Nitrite NEGATIVE NEGATIVE   Leukocytes, UA SMALL (A) NEGATIVE  Urine microscopic-add on     Status: Abnormal   Collection Time: 11/27/15  9:15 AM  Result Value Ref Range   Squamous Epithelial / LPF 6-30 (A) NONE SEEN   WBC, UA 0-5 0 - 5 WBC/hpf   RBC / HPF 0-5 0 - 5 RBC/hpf   Bacteria, UA MANY (A) NONE SEEN   Casts GRANULAR CAST (A) NEGATIVE   Urine-Other YEAST PRESENT   I-Stat CG4 Lactic Acid, ED     Status: None  Collection Time: 11/27/15 12:08 PM  Result Value Ref Range   Lactic Acid, Venous 0.77 0.5 - 2.0 mmol/L    Dg Chest 2 View  11/27/2015  CLINICAL DATA:  Upper back and shoulder pain beginning while vacuuming 2 days ago. Initial encounter. No known injury. EXAM: CHEST  2 VIEW COMPARISON:  None. FINDINGS: The lungs are clear. Heart size is normal. No pneumothorax or pleural effusion. No focal  bony abnormality. IMPRESSION: No acute disease. Electronically Signed   By: Inge Rise M.D.   On: 11/27/2015 09:36   US Abdomen Limited Ruq  11/27/2015  CLINICAL DATA:  Elevated LFTs, abdominal pain for 2 days, HIV EXAM: US ABDOMEN LIMITED - RIGHT UPPER QUADRANT COMPARISON:  None. FINDINGS: Gallbladder: No gallstones are noted within gallbladder. There is thickening of gallbladder wall up to 5.5 mm. Edema noted gallbladder wall. There is positive sonographic Murphy sign. Findings are suspicious for early cholecystitis. Clinical correlation is necessary. Common bile duct: Diameter: 3 mm in diameter within normal limits. Liver: No focal lesion identified. Within normal limits in parenchymal echogenicity. IMPRESSION: No gallstones are noted within gallbladder. There is thickening of gallbladder wall up to 5.5 mm. Edema noted gallbladder wall. There is positive sonographic Murphy sign. Findings are suspicious for early cholecystitis. Clinical correlation is necessary. Normal CBD. Electronically Signed   By: Lahoma Crocker M.D.   On: 11/27/2015 13:08    Review of Systems  Constitutional: Positive for fever, chills and malaise/fatigue. Negative for weight loss and diaphoresis.  Eyes: Negative for blurred vision, double vision, photophobia, pain, discharge and redness.  Respiratory: Positive for shortness of breath. Negative for cough, hemoptysis, sputum production and wheezing.   Cardiovascular: Negative for chest pain, palpitations, orthopnea, claudication, leg swelling and PND.  Gastrointestinal: Positive for abdominal pain. Negative for heartburn, nausea, vomiting, diarrhea, constipation, blood in stool and melena.  Genitourinary: Negative for dysuria, urgency, frequency, hematuria and flank pain.  Musculoskeletal: Positive for myalgias and back pain. Negative for joint pain and neck pain.  Neurological: Negative for dizziness, tingling, tremors, sensory change, speech change, focal weakness, seizures,  loss of consciousness and weakness.  Psychiatric/Behavioral: Negative for suicidal ideas and substance abuse.   Blood pressure 113/89, pulse 100, temperature 98.8 F (37.1 C), temperature source Oral, resp. rate 7, height _0  (1.626 m), weight 61.236 kg (135 lb), last menstrual period 11/08/2015, SpO2 99 %. Physical Exam  Constitutional: She is oriented to person, place, and time. She appears well-developed and well-nourished. No distress.  Cardiovascular: Normal rate, regular rhythm and intact distal pulses.  Exam reveals no gallop and no friction rub.   No murmur heard. Respiratory: Effort normal. No respiratory distress. She has no wheezes. She has no rales. She exhibits no tenderness.  GI: Soft. Bowel sounds are normal. She exhibits no mass. There is no rebound and no guarding.  TTP epigastrium and RUQ.   Musculoskeletal: Normal range of motion. She exhibits no edema or tenderness.  Neurological: She is alert and oriented to person, place, and time.  Skin: Skin is warm and dry. No rash noted. She is not diaphoretic. No erythema. No pallor.  Psychiatric: She has a normal mood and affect. Her behavior is normal. Judgment and thought content normal.    Assessment/Plan: Abdominal pain Abnormal LFTs Story is atypical for cholecystitis, no gallstones on [place], normal white count.  Will proceed with a HIDA scan to confirm.  May have clears, NPO after midnight.   +HIV-had not been on any medication since 2011, perhaps  even longer.  Has seen Dr. Johnnye Sima.  CD4 count is 60.  Management per primary team ID-[id number] for gallbladder, Vanc per primary team, but not indicated for cholecystitis. VTE prophylaxis-lovenox/SCD Dispo-HIDA in AM  Takeru Bose ANP-BC 11/27/2015, 4:26 PM

## 2015-11-27 NOTE — ED Notes (Signed)
Attempted report x1. 

## 2015-11-27 NOTE — ED Notes (Signed)
Pt transported to US

## 2015-11-27 NOTE — Consult Note (Addendum)
[facility]  Total days of antibiotics 1        Day 1 vanco        Day 1 piptazo        Day 1 fluconazole       Reason for Consult: fever in HIV/AIDS patient   Referring Physician: klima  Principal Problem:   Fever in adult Active Problems:   AIDS ([place])   Acalculous cholecystitis   Vaginal candidiasis    HPI: Allison Wilson is a 35 y.o. female originally from [place] with advanced HIV 2 disease, off of ARTx 7 yrs, not seen in our clinic since 2011. Previous CD 4 countof 220/VL<200 on PI based regimen with truvada-boosted atazanaivr. She immigrated to the [place] in 2007. She was diagnosed with HIV disease at that time per her report. She also has hx of LTBI, treated for 9 months with INH when she came to [facility]. She presents to the ED with c/o  Fevers, back pain, bilateral side pain 2 days prior to admission. Reports myalgias but denies having any recent weight loss, drenching nightsweats, no cough, no diarrhea, abdominal pain, or rash. 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 On admit, she is found to be febrile, temp 102.7, tachycardic. Labs show wBC of 3.3 (near her BL), cd 4 count of 60, transaminitis with ast 248, ALT 150, alp 261, tbili 0.5. LA 3. She was started on vanco and piptazo for concern of sepsis. She underwent RUQ U/S that showed gallbladder wall thickening though her physical exam was benign. She was evaluated by surgery who felt atypical presentation for cholecystitis, recommended getting HIDA in the morning  Past Medical History  Diagnosis Date  . HIV (human immunodeficiency virus infection) ([place])   . Hepatitis A    Allergies: No Known Allergies  MEDICATIONS: . enoxaparin (LOVENOX) injection  40 mg Subcutaneous Q24H  . fluconazole  200 mg Oral Daily  . piperacillin-tazobactam (ZOSYN)  IV  3.375 g Intravenous Q8H    Social History  Substance Use Topics  . Smoking  status: Never Smoker   . Smokeless tobacco: None  . Alcohol Use: No    History reviewed. No pertinent family history.   Review of Systems  Constitutional: positive for fever, chills, diaphoresis, activity change, appetite change, fatigue and unexpected weight change.  HENT: Negative for congestion, sore throat, rhinorrhea, sneezing, trouble swallowing and sinus pressure.  Eyes: Negative for photophobia and visual disturbance.  Respiratory: Negative for cough, chest tightness, shortness of breath, wheezing and stridor.  Cardiovascular: Negative for chest pain, palpitations and leg swelling.  Gastrointestinal: Negative for nausea, vomiting, abdominal pain, diarrhea, constipation, blood in stool, abdominal distention and anal bleeding.  Genitourinary: vaginal discharge Negative for dysuria, hematuria, flank pain and difficulty urinating.  Musculoskeletal:+ myalgias, back pain Negative for myalgias, back pain, joint swelling, arthralgias and gait problem.  Skin: Negative for color change, pallor, rash and wound.  Neurological: Negative for dizziness, tremors, weakness and light-headedness.  Hematological: Negative for adenopathy. Does not bruise/bleed easily.  Psychiatric/Behavioral: Negative for behavioral problems, confusion, sleep disturbance, dysphoric mood, decreased concentration and agitation.     OBJECTIVE: Temp:  [98.8 F (37.1 C)-102.7 F (39.3 C)] 102.7 F (39.3 C) (02/03 1700) Pulse Rate:  [100-119] 102 (02/03 1700) Resp:  [7-27] 15 (02/03 1700) BP: (113-166)/(87-113) 147/102 mmHg (02/03 1700) SpO2:  [99 %-100 %] 100 % (02/03 1700) Weight:  [630  lb (61.236 kg)] 135 lb (61.236 kg) (02/03 0844) Physical Exam  Constitutional:  oriented to person, place, and time. appears well-developed and well-nourished. No distress. Appears flushed HENT: [place]/AT, PERRLA, no scleral icterus Mouth/Throat: Oropharynx is clear and moist. + thrush Cardiovascular: Normal rate, regular rhythm and  normal heart sounds. Exam reveals no gallop and no friction rub.  No murmur heard.  Pulmonary/Chest: Effort normal and breath sounds normal. No respiratory distress.  has no wheezes.  Neck = supple, no nuchal rigidity Abdominal: Soft. Bowel sounds are normal.  exhibits no distension. There is no tenderness.  Lymphadenopathy: no cervical adenopathy. No axillary adenopathy Neurological: alert and oriented to person, place, and time.  Skin: Skin is warm and dry. No rash noted. No erythema.  Psychiatric: a normal mood and affect.  behavior is normal.   LABS: Results for orders placed or performed during the hospital encounter of 11/27/15 (from the past 48 hour(s))  Influenza panel by PCR (type A & B, H1N1)     Status: None   Collection Time: 11/27/15  9:01 AM  Result Value Ref Range   Influenza A By PCR NEGATIVE NEGATIVE   Influenza B By PCR NEGATIVE NEGATIVE   H1N1 flu by pcr NOT DETECTED NOT DETECTED    Comment:        The Xpert Flu assay (FDA approved for nasal aspirates or washes and nasopharyngeal swab specimens), is intended as an aid in the diagnosis of influenza and should not be used as a sole basis for treatment.   CBC with Differential     Status: Abnormal   Collection Time: 11/27/15  9:06 AM  Result Value Ref Range   WBC 3.3 (L) 4.0 - 10.5 K/uL   RBC 3.50 (L) 3.87 - 5.11 MIL/uL   Hemoglobin 9.2 (L) 12.0 - 15.0 g/dL   HCT 27.6 (L) 36.0 - 46.0 %   MCV 78.9 78.0 - 100.0 fL   MCH 26.3 26.0 - 34.0 pg   MCHC 33.3 30.0 - 36.0 g/dL   RDW 13.9 11.5 - 15.5 %   Platelets 235 150 - 400 K/uL   Neutrophils Relative % 69 %   Neutro Abs 2.3 1.7 - 7.7 K/uL   Lymphocytes Relative 23 %   Lymphs Abs 0.8 0.7 - 4.0 K/uL   Monocytes Relative 8 %   Monocytes Absolute 0.3 0.1 - 1.0 K/uL   Eosinophils Relative 0 %   Eosinophils Absolute 0.0 0.0 - 0.7 K/uL   Basophils Relative 0 %   Basophils Absolute 0.0 0.0 - 0.1 K/uL  Comprehensive metabolic panel     Status: Abnormal   Collection  Time: 11/27/15  9:06 AM  Result Value Ref Range   Sodium 131 (L) 135 - 145 mmol/L   Potassium 3.2 (L) 3.5 - 5.1 mmol/L   Chloride 103 101 - 111 mmol/L   CO2 20 (L) 22 - 32 mmol/L   Glucose, Bld 151 (H) 65 - 99 mg/dL   BUN 7 6 - 20 mg/dL   Creatinine, Ser 0.80 0.44 - 1.00 mg/dL   Calcium 8.5 (L) 8.9 - 10.3 mg/dL   Total Protein 10.8 (H) 6.5 - 8.1 g/dL   Albumin 2.2 (L) 3.5 - 5.0 g/dL   AST 248 (H) 15 - 41 U/L   ALT 150 (H) 14 - 54 U/L   Alkaline Phosphatase 261 (H) 38 - 126 U/L   Total Bilirubin 0.5 0.3 - 1.2 mg/dL   GFR calc non Af Amer >60 >60 mL/min  GFR calc Af Amer >60 >60 mL/min    Comment: (NOTE) The eGFR has been calculated using the CKD EPI equation. This calculation has not been validated in all clinical situations. eGFR's persistently <60 mL/min signify possible Chronic Kidney Disease.    Anion gap 8 5 - 15  T-helper cells (CD4) count (not at [facility])     Status: Abnormal   Collection Time: 11/27/15  9:06 AM  Result Value Ref Range   CD4 T Cell Abs 60 (L) 400 - 2700 /uL   CD4 % Helper T Cell 7 (L) 33 - 55 %    Comment: Performed at [facility]  I-Stat Beta hCG blood, ED ([facility], [facility], [facility] only)     Status: None   Collection Time: 11/27/15  9:07 AM  Result Value Ref Range   I-stat hCG, quantitative <5.0 <5 mIU/mL   Comment 3            Comment:   GEST. AGE      CONC.  (mIU/mL)   <=1 WEEK        5 - 50     2 WEEKS       50 - 500     3 WEEKS       100 - 10,000     4 WEEKS     1,000 - 30,000        FEMALE AND NON-PREGNANT FEMALE:     LESS THAN 5 mIU/mL   I-Stat CG4 Lactic Acid, ED     Status: Abnormal   Collection Time: 11/27/15  9:09 AM  Result Value Ref Range   Lactic Acid, Venous 3.39 (HH) 0.5 - 2.0 mmol/L   Comment NOTIFIED PHYSICIAN   Urinalysis, Routine w reflex microscopic (not at [facility])     Status: Abnormal   Collection Time: 11/27/15  9:15 AM  Result Value Ref Range   Color, Urine AMBER (A) YELLOW    Comment: BIOCHEMICALS MAY BE AFFECTED BY  COLOR   APPearance CLOUDY (A) CLEAR   Specific Gravity, Urine 1.025 1.005 - 1.030   pH 6.5 5.0 - 8.0   Glucose, UA NEGATIVE NEGATIVE mg/dL   Hgb urine dipstick SMALL (A) NEGATIVE   Bilirubin Urine SMALL (A) NEGATIVE   Ketones, ur NEGATIVE NEGATIVE mg/dL   Protein, ur 100 (A) NEGATIVE mg/dL   Nitrite NEGATIVE NEGATIVE   Leukocytes, UA SMALL (A) NEGATIVE  Urine microscopic-add on     Status: Abnormal   Collection Time: 11/27/15  9:15 AM  Result Value Ref Range   Squamous Epithelial / LPF 6-30 (A) NONE SEEN   WBC, UA 0-5 0 - 5 WBC/hpf   RBC / HPF 0-5 0 - 5 RBC/hpf   Bacteria, UA MANY (A) NONE SEEN   Casts GRANULAR CAST (A) NEGATIVE   Urine-Other YEAST PRESENT   I-Stat CG4 Lactic Acid, ED     Status: None   Collection Time: 11/27/15 12:08 PM  Result Value Ref Range   Lactic Acid, Venous 0.77 0.5 - 2.0 mmol/L    MICRO: 2/3 blood cx pending IMAGING: Dg Chest 2 View  11/27/2015  CLINICAL DATA:  Upper back and shoulder pain beginning while vacuuming 2 days ago. Initial encounter. No known injury. EXAM: CHEST  2 VIEW COMPARISON:  None. FINDINGS: The lungs are clear. Heart size is normal. No pneumothorax or pleural effusion. No focal bony abnormality. IMPRESSION: No acute disease. Electronically Signed   By: Inge Rise M.D.   On: 11/27/2015 09:36  US Abdomen Limited Ruq  11/27/2015  CLINICAL DATA:  Elevated LFTs, abdominal pain for 2 days, HIV EXAM: US ABDOMEN LIMITED - RIGHT UPPER QUADRANT COMPARISON:  None. FINDINGS: Gallbladder: No gallstones are noted within gallbladder. There is thickening of gallbladder wall up to 5.5 mm. Edema noted gallbladder wall. There is positive sonographic Murphy sign. Findings are suspicious for early cholecystitis. Clinical correlation is necessary. Common bile duct: Diameter: 3 mm in diameter within normal limits. Liver: No focal lesion identified. Within normal limits in parenchymal echogenicity. IMPRESSION: No gallstones are noted within gallbladder.  There is thickening of gallbladder wall up to 5.5 mm. Edema noted gallbladder wall. There is positive sonographic Murphy sign. Findings are suspicious for early cholecystitis. Clinical correlation is necessary. Normal CBD. Electronically Signed   By: Lahoma Crocker M.D.   On: 11/27/2015 13:08   Assessment/Plan:  35yo F with advanced HIV disease presents with ILI, fevers, myalgias. Found to have thrush on exam. Lab work up reveals transaminitis and US shows evidence of gallbladder wall thickening . She was started on piptazo for empiric coverage  1) hiv disease = will get viral load with genotype. Will anticipate to place her back onto a pi based regimen such as on descovy-darunavir-ritonavir 2) oi proph = will need to start bactrim ss daily. 3) thrush = continue on fluconazole 215m daily, which would also cover vaginal candidiasis 4) febrile illness=  She was ruled out for influenza. No explanation yet for her fever, myalgias. Has sick contact but due to advance hiv disease, could be due to MAC disseminated infection, CMV, other viral illness. Will follow up on cultures to see if she has bacteremia. For now, keep on piptazo.  5) gallbladder wall thickening = could be early cholecystitis which could explain fever, though patient does not have any GI complaints which would be unusual. Agree with getting HIDA scan 6) transaminitis = hepatitis serologies pending, as well as CMV VL.    Dr VTommy Medalto provide further recs  CCaren GriffinsB. SHuronfor Infectious Diseases [phone]

## 2015-11-27 NOTE — H&P (Signed)
Date: 11/27/2015               Patient Name:  Allison Wilson MRN: [name] Age / Sex: 35 y.o., female   PCP: Ginnie Smart, MD         Medical Service: Internal Medicine Teaching Service         Attending Physician: Dr. Cathren Laine, MD    First Contact: Dr. Justus Memory, MS4 Pager: [phone]  Second Contact: Dr. Heywood Iles Pager: [phone]       After Hours (After 5p/  First Contact Pager: [phone]  weekends / holidays): Second Contact Pager: [phone]   Chief Complaint: fever and myalgia  History of Present Illness: [name] is a 35 year old woman from [place] with PMH of HIV (dx in 2007, previously managed by [facility], off trx since 2011).  She is here with acute c/o fever (Tmax 101F) and myalgias x 2 days.  Muscle ache is specifically B/L back, shoulders (notices it when she vacuums) and legs.  She denies headache, photophobia, loss of vision, neck pain/stiffness, N/V, diarrhea, cough, sore throat or weakness.  She also notes thick, white vaginal discharge and itching x 3 days that she feels is c/w yeast.  She says this has been a recurrent problem since last year and she was on trx (she cannot remember the drug) for about three months.  It resumed again a few days ago.  She reports no know hx of GC/CT/HSV and denies genital ulcers.  Last CD4 was 200 and viral load 245 (2011).  She says she did follow with Dr. Ninetta Lights but was never on HAART.  There are pre-EPIC [facility] notes dating back to 2007.  It looks like there were some issues with follow-up visit, but it looks like she was on Norvir, Reyataz, Truvada from Nov 2008 through May 2011, after which time she was lost to follow-up.    Meds: Current Facility-Administered Medications  Medication Dose Route Frequency Provider Last Rate Last Dose  . piperacillin-tazobactam (ZOSYN) IVPB 3.375 g  3.375 g Intravenous Once Rachel L Rumbarger, RPH      . sodium chloride 0.9 % bolus 1,000 mL  1,000 mL Intravenous Once Cathren Laine, MD 2,000 mL/hr at 11/27/15 1007 1,000 mL at 11/27/15 1007   Followed by  . sodium chloride 0.9 % bolus 1,000 mL  1,000 mL Intravenous Once Cathren Laine, MD      . vancomycin (VANCOCIN) IVPB 1000 mg/200 mL premix  1,000 mg Intravenous Once Rosaland Lao, [facility]       Current Outpatient Prescriptions  Medication Sig Dispense Refill  . oxymetazoline (AFRIN) 0.05 % nasal spray Place 1 spray into both nostrils 2 (two) times daily.      Allergies: Allergies as of 11/27/2015  . (No Known Allergies)   Past Medical History  Diagnosis Date  . HIV (human immunodeficiency virus infection) ([facility])    History reviewed. No pertinent past surgical history. History reviewed. No pertinent family history. Social History   Social History  . Marital Status: Single    Spouse Name: N/A  . Number of Children: N/A  . Years of Education: N/A   Occupational History  . Not on file.   Social History Main Topics  . Smoking status: Never Smoker   . Smokeless tobacco: Not on file  . Alcohol Use: No  . Drug Use: No  . Sexual Activity: Not on file   Other Topics Concern  . Not on  file   Social History Narrative  . No narrative on file    Review of Systems: A comprehensive review of systems was negative except for those reported in the HPI.  Physical Exam: Blood pressure 144/99, pulse 103, temperature 100.4 F (38 C), temperature source Oral, resp. rate 18, height  (1.626 m), weight 135 lb (61.236 kg), last menstrual period 11/08/2015, SpO2 100 %. General: resting in bed in NAD HEENT: PERRL, EOMI, no scleral icterus, neck supple with normal ROM, + thrush Cardiac: RRR, no rubs, murmurs or gallops Pulm: clear to auscultation bilaterally, moving normal volumes of air Abd: soft, , nondistended, BS present, mild TTP lower quadrants, no rebound or guarding GU:  External genitalia normal, no lesions noted, there is thick, white discharge Ext: warm and well perfused, no pedal edema, 2+  DPs Neuro: alert and oriented X3, cranial nerves II-XII grossly intact, responding appropriately, strength and sensation grossly intact and equal upper and lower extremities  Lab results: Basic Metabolic Panel:  Recent Labs  14/78/29 0906  NA 131*  K 3.2*  CL 103  CO2 20*  GLUCOSE 151*  BUN 7  CREATININE 0.80  CALCIUM 8.5*   Liver Function Tests:  Recent Labs  11/27/15 0906  AST 248*  ALT 150*  ALKPHOS 261*  BILITOT 0.5  PROT 10.8*  ALBUMIN 2.2*   CBC:  Recent Labs  11/27/15 0906  WBC 3.3*  NEUTROABS 2.3  HGB 9.2*  HCT 27.6*  MCV 78.9  PLT 235   Urinalysis:  Recent Labs  11/27/15 0915  COLORURINE AMBER*  LABSPEC 1.025  PHURINE 6.5  GLUCOSEU NEGATIVE  HGBUR SMALL*  BILIRUBINUR SMALL*  KETONESUR NEGATIVE  PROTEINUR 100*  NITRITE NEGATIVE  LEUKOCYTESUR SMALL*   Imaging results:  Dg Chest 2 View  11/27/2015  CLINICAL DATA:  Upper back and shoulder pain beginning while vacuuming 2 days ago. Initial encounter. No known injury. EXAM: CHEST  2 VIEW COMPARISON:  None. FINDINGS: The lungs are clear. Heart size is normal. No pneumothorax or pleural effusion. No focal bony abnormality. IMPRESSION: No acute disease. Electronically Signed   By: Drusilla Kanner M.D.   On: 11/27/2015 09:36   Assessment & Plan by Problem: 35 year old woman with untreated HIV here with fever and myalgia of unclear etiology.  Fever in immunosuppressed adult:   Initial presentation of fever and myalgia concerning for influenza, however flu panel negative.  She has no headache, neck pain/stiffness, altered mentation to suggest meningitis or encephalitis.  She has no pulmonary symptoms or hypoxia and there is no infiltrate on CXR to suggest lung infection.  Her LFTs are slightly elevated but not in the range that would be expected in acute hepatitis.  Her immunosuppressed state leaves a large differential (bacterial, viral, MAC, TB, CMV).  RUQ abdominal US reveals GB wall thickening,  positive sonographic murphy's sign and no stones in the GB suggesting acalculous cholecystitis.  CBD in normal diameter at 3mm and tbili normal so no biliary obstruction to suggest AIDS cholangiopathy.  - continue zosyn - d/c vancomycin since likely enteric source for cholecystitis - follow-up routine blood cultures and AFB and fungal cultures and narrow trx as appropriate - follow-up HBV and HCV testing - follow-up viral load  - vitals qshift and prn - ID and surgery consulted and recommendations appreciated  Vaginal and oral candidiasis:   Diflucan daily x 7 days  HIV/AIDS:  Previously followed by [facility]. Lost to follow-up and not on therapy for at least 6 years.  CD4 today is 60.  Awaiting viral load.   - ID consulted and recommendations appreciated - will hold off on reinitiating HAART for now  Diet:  Clear liquid, NPO past MN for HIDA in AM VTE ppx:  [place] Lovenox Code:  Full  Dispo: Disposition is deferred at this time, awaiting improvement of current medical problems. Anticipated discharge in approximately 2-3 day(s).   The patient does not have a current PCP and does not know need an [facility] hospital follow-up appointment after discharge.  The patient does not know have transportation limitations that hinder transportation to clinic appointments.  Signed: Yolanda Manges, DO 11/27/2015, 10:23 AM

## 2015-11-27 NOTE — ED Notes (Signed)
Pt states she has been having body aches/fever since Wednesday along with a cold. Denies cough.

## 2015-11-27 NOTE — ED Notes (Signed)
Called code sepsis on pt, per [facility], RN

## 2015-11-27 NOTE — H&P (Signed)
Date: 11/27/2015               Patient Name:  Allison Wilson MRN: [id number]  DOB: 08-27-1981 Age / Sex: 35 y.o., female   PCP: Campbell Riches, MD              Medical Service: Internal Medicine Teaching Service              Attending Physician: Dr. Oval Linsey, MD    First Contact: Marlynn Perking, MS 4 Pager: [phone]  Second Contact: Dr. Charlott Rakes Pager: [phone]    Third Contact Dr. Eppie Gibson Pager: [phone]        After Hours (After 5p/  First Contact Pager: [phone]  weekends / holidays): Second Contact Pager: [phone]   Chief Complaint: Fever, body aches  History of Present Illness:  Allison Wilson is a 35 year old woman with a history of HIV (not currently on treatment) presented to the [facility] ED complaining of pain all over and feeling feverish for 2 days (11/25/15).  The pain has gotten increasingly worse over the last 2 days and that it feels like muscle aches.  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  She received a flu shot last fall.  She denies recent headaches, neck stiffness, vision changes, or sensitivity to light.  She denies congestion, cough, chest pain, shortness of breath. Denies pain in urination, increased frequency or foul smelling urine.  No recent travel.  She is from [place], but has not been there since last summer.   Patient also reports white clumpy vaginal discharge that started about 3 days ago. Denies itchiness.  She had a yeast infection last year and was treated for it.  Patient denies history of gonorrhea or chlamydia.  LMP was 11/08/15 and was normal.  Patient is sexually active and uses condoms. Patient reports intermittent lower abdominal pain that is sharp in nature. She also notes that sometimes she feels as though something is protruding out of her vagina.  Patient is currently not on medications for HIV.  Per her report, she has never been on medicine for HIV because of issues with insurance.  She  was diagnosed in 2007 in [place].   While in the ED, code sepsis was called and patient was given NS 1L bolus x3 and started on zosyn IV and vancomycin IV. Abdominal US was obtained in response to complaints of abdominal pain.    Meds: Current Facility-Administered Medications  Medication Dose Route Frequency Provider Last Rate Last Dose  . 0.9 %  sodium chloride infusion   Intravenous Continuous Francesca Oman, DO      . enoxaparin (LOVENOX) injection 40 mg  40 mg Subcutaneous Q24H Francesca Oman, DO      . ibuprofen (ADVIL,MOTRIN) tablet 400 mg  400 mg Oral Q6H PRN Francesca Oman, DO   400 mg at 11/27/15 1544  . piperacillin-tazobactam (ZOSYN) IVPB 3.375 g  3.375 g Intravenous Q8H Rachel L Rumbarger, RPH      . vancomycin (VANCOCIN) IVPB 750 mg/150 ml premix  750 mg Intravenous Q8H Rachel L Rumbarger, RPH        Allergies: Allergies as of 11/27/2015  . (No Known Allergies)   Past Medical History  Diagnosis Date  . HIV (human immunodeficiency virus infection) ([place])   . Hepatitis A    History reviewed. No pertinent past surgical history. History reviewed. No pertinent family history. Social History   Social History  .  Marital Status: Single    Spouse Name: N/A  . Number of Children: N/A  . Years of Education: N/A   Occupational History  . Not on file.   Social History Main Topics  . Smoking status: Never Smoker   . Smokeless tobacco: Not on file  . Alcohol Use: No  . Drug Use: No  . Sexual Activity: Not on file   Other Topics Concern  . Not on file   Social History Narrative  . No narrative on file    Review of Systems: Constitutional: positive for fatigue, fevers and malaise Eyes: negative for visual disturbance and sensitivity to light Ears, nose, mouth, throat, and face: negative for nasal congestion Respiratory: negative for cough, dyspnea on exertion and sputum Cardiovascular: negative for chest pain Gastrointestinal: positive for abdominal pain, negative for  change in bowel habits, diarrhea and vomiting Genitourinary:negative for dysuria, frequency and hematuria Hematologic/lymphatic: negative for bleeding Musculoskeletal:positive for back pain and myalgias, negative for neck pain Neurological: negative for dizziness and headaches  Physical Exam: Blood pressure 113/89, pulse 100, temperature 98.8 F (37.1 C), temperature source Oral, resp. rate 7, height 5' 4"  (1.626 m), weight 61.236 kg (135 lb), last menstrual period 11/08/2015, SpO2 99 %. BP 113/89 mmHg  Pulse 100  Temp(Src) 98.8 F (37.1 C) (Oral)  Resp 7  Ht 5' 4"  (1.626 m)  Wt 61.236 kg (135 lb)  BMI 23.16 kg/m2  SpO2 99%  LMP 11/08/2015  General Appearance:   Young woman, lying in hospital bed, alert, cooperative, no distress, appears stated age  Head:    Normocephalic, without obvious abnormality, atraumatic  Eyes:    PERRL, conjunctiva/corneas clear, EOM's intact  Ears:    Normal external ear canals, both ears  Nose:   Nares normal, septum midline, mucosa normal, no drainage    or sinus tenderness  Throat:   Lips dry, moist mucous membranes, white patch on tongue  Neck:   Supple, symmetrical, trachea midline,  Back:     Diffusely tender to palpation of back, symmetric, no CVA tenderness  Lungs:     Clear to auscultation bilaterally, respirations unlabored  Chest Wall:    No tenderness or deformity   Heart:    Regular rate and rhythm, S1 and S2 normal, no murmur, rub   or gallop  Abdomen:     Soft, minimally tender to palpation of LLQ and RLQ, bowel sounds active all four quadrants,    no masses, no organomegaly  Genitalia:    Normal female without lesion, white discharge noted, grade 1 uterine prolapse  Extremities:   Extremities normal, atraumatic, no cyanosis or edema  Pulses:   2+ and symmetric all extremities  Skin:   Skin color, texture, turgor normal, no rashes or lesions  Neurologic:   CNII-XII intact, normal strength, sensation and reflexes    throughout    Lab  results: Basic Metabolic Panel:  Recent Labs  11/27/15 0906  NA 131*  K 3.2*  CL 103  CO2 20*  GLUCOSE 151*  BUN 7  CREATININE 0.80  CALCIUM 8.5*   Liver Function Tests:  Recent Labs  11/27/15 0906  AST 248*  ALT 150*  ALKPHOS 261*  BILITOT 0.5  PROT 10.8*  ALBUMIN 2.2*   No results for input(s): LIPASE, AMYLASE in the last 72 hours. No results for input(s): AMMONIA in the last 72 hours. CBC:  Recent Labs  11/27/15 0906  WBC 3.3*  NEUTROABS 2.3  HGB 9.2*  HCT 27.6*  MCV  78.9  PLT 235   Cardiac Enzymes: No results for input(s): CKTOTAL, CKMB, CKMBINDEX, TROPONINI in the last 72 hours. BNP: No results for input(s): PROBNP in the last 72 hours. D-Dimer: No results for input(s): DDIMER in the last 72 hours. CBG: No results for input(s): GLUCAP in the last 72 hours. Hemoglobin A1C: No results for input(s): HGBA1C in the last 72 hours. Fasting Lipid Panel: No results for input(s): CHOL, HDL, LDLCALC, TRIG, CHOLHDL, LDLDIRECT in the last 72 hours. Thyroid Function Tests: No results for input(s): TSH, T4TOTAL, FREET4, T3FREE, THYROIDAB in the last 72 hours. Anemia Panel: No results for input(s): VITAMINB12, FOLATE, FERRITIN, TIBC, IRON, RETICCTPCT in the last 72 hours. Coagulation: No results for input(s): LABPROT, INR in the last 72 hours. Urine Drug Screen: Drugs of Abuse  No results found for: LABOPIA, COCAINSCRNUR, LABBENZ, AMPHETMU, THCU, LABBARB  Alcohol Level: No results for input(s): ETH in the last 72 hours. Urinalysis:  Recent Labs  11/27/15 0915  COLORURINE AMBER*  LABSPEC 1.025  PHURINE 6.5  GLUCOSEU NEGATIVE  HGBUR SMALL*  BILIRUBINUR SMALL*  KETONESUR NEGATIVE  PROTEINUR 100*  NITRITE NEGATIVE  LEUKOCYTESUR SMALL*   Misc. Labs:  I-stat hCG quantitative: <5 Lactic Acid: 3.39 (0909), 0.77 (1208) Influenza A by PCR: Negative Influenza B by PCR: Negative H1N1 flu by PCR: Not detected CD4 TCells/% Helper T cell: 60/7 Blood Cx:  pending Urine Cx: pending  Imaging results:  Dg Chest 2 View  11/27/2015  CLINICAL DATA:  Upper back and shoulder pain beginning while vacuuming 2 days ago. Initial encounter. No known injury. EXAM: CHEST  2 VIEW COMPARISON:  None. FINDINGS: The lungs are clear. Heart size is normal. No pneumothorax or pleural effusion. No focal bony abnormality. IMPRESSION: No acute disease. Electronically Signed   By: Inge Rise M.D.   On: 11/27/2015 09:36   US Abdomen Limited Ruq  11/27/2015  CLINICAL DATA:  Elevated LFTs, abdominal pain for 2 days, HIV EXAM: US ABDOMEN LIMITED - RIGHT UPPER QUADRANT COMPARISON:  None. FINDINGS: Gallbladder: No gallstones are noted within gallbladder. There is thickening of gallbladder wall up to 5.5 mm. Edema noted gallbladder wall. There is positive sonographic Murphy sign. Findings are suspicious for early cholecystitis. Clinical correlation is necessary. Common bile duct: Diameter: 3 mm in diameter within normal limits. Liver: No focal lesion identified. Within normal limits in parenchymal echogenicity. IMPRESSION: No gallstones are noted within gallbladder. There is thickening of gallbladder wall up to 5.5 mm. Edema noted gallbladder wall. There is positive sonographic Murphy sign. Findings are suspicious for early cholecystitis. Clinical correlation is necessary. Normal CBD. Electronically Signed   By: Lahoma Crocker M.D.   On: 11/27/2015 13:08    Other results:   Assessment & Plan by Problem: Principal Problem:   Fever in adult Active Problems:   AIDS ([place])   Acalculous cholecystitis   Vaginal candidiasis  Hasantu Lada is a 35 year old woman with history of HIV (not on treatment) who presents with fever and body aches who is found be febrile at 100.654F, lactic acid of 3.39, normotensive, chest XR with no acute findings, and negative influenza A and B PCR.   # Fever in Immunocompromised Adult: Patient presents with 2 days of fever and body aches and is found to have  temperature of 100.654F and lactic acid of 3.39.  Code sepsis was called in ED and patient was given fluid three 1L NS boluses and started on IV zosyn and vancomycin. Possible etiologies include viral vs bacterial  vs fungal.  Cholecystitis is possible given intermittent abdominal pain and thickened gallbladder wall and positive sonographic murphy sign on abdominal US.  No gallstones were observed on [place], so acalculous cholecystitis should be considered.  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  Other viral etiologies could be mononucleosis or CMV. Yeast infection is present in oropharynx and vagina making disseminated candidiasis a concern, however patient looks clinically well.  Hepatitis is possible given history of HIV, however patient's LFTs do not point toward acute hepatitis.  Patient was positive for Hep A Ab in the past, but no hepatitis B or C studies have been performed.  Infection of lung unlikely as patient does not have cough and CXR is clear.  Urinary infection is unlikely as patient does not have dysuria or increased frequency. Meningitis is unlikely as patient does not complain of stiff neck, headache and does not have signs of meningeal irritation on physical exam.  - Consulted ID - Started IV fluids D5-NS at 125 ml/hr - Continue vancomycin and zosyn - Continue to monitor WBC and differential on am CBC with differential - Follow up blood culture and urine culture - Follow up CMV DNA - Follow up Hep B and C studies - Follow up AFB and fungus cultures   #Abdominal Pain with Elevated LFTs: Patient has intermittent abdominal pain in the setting of elevated AST, ALT, and alk phos and thickened gallbladder wall and positive sonographic Murphy sign on [place]. Cholecystitis is likely as patient has fever, abdominal pain, and thickened gallbladder (5 mm) on  [place].   Acalculous Cholecystitis should be considered given fever, abdominal pain and thickened gallbladder wall in the absence of gallstones.  AIDS cholangiopathy should also be considered given elevated alk phos, AST and ALT in the setting of CD4 count of 60. Disseminated candidiasis (hepatosplenic candidiasis) should be considered given candidiasis infection of oropharynx and vagina in the setting of elevated alk phos. Less likely as patient has not had nausea, vomiting, anorexia. Consider CT evaluation of liver, spleen and kidneys for microabscesses. - Consulted surgery   - Monitor LFTs on am CMP  #Vaginal and Oropharyngeal Candidiasis:  Patient reports white vaginal discharge for the past 3 days. Likely vaginal candidiasis based on physical exam and yeast present on UA.  Yeast was also noted on her tongue.  Patient has been treated for vaginal candidiasis in the past.  - Follow up ID recommendations for antifungal treatment   #HIV: Patient was diagnosed with HIV in 2007. HIV-2 (not sensitive to NNRTIs)  Patient has not been taking HAART.  Per chart review, patient was prescribed truvada 200-300 mg daily, reyataz 300 mg daily, and norvir 100 mg daily. Last HIV labs performed 03/01/10 showed HIV 1 RNA quant was 245, CD4 T Cell Abs at 200 and CD4% helper T cell at 19.  - Follow up HIV RNA quant - Discuss establishment of care for HIV  #Hx of Latent TB: History noted EMR. Given CD4 count of 60, patient is at risk for progression to acute TB.  No evidence of acute TB on CXR.  - Follow up ID recommendations re possible prophylaxis if indicated  #Grade 1 Uterine Prolapse: Patient reports sensation of something falling out of her vagina.  On physical exam, pelvic organs appear present in the vaginal canal.  - Consider outpatient follow up with OBGYN  This is a Careers information officer Note.  The care of the patient was discussed with Dr. Duwaine Maxin  and the assessment and plan was formulated with their  assistance.  Please see their note for official documentation of the patient encounter.   Signed: Lovina Reach, Med Student 11/27/2015, 5:03 PM

## 2015-11-27 NOTE — Progress Notes (Signed)
Pharmacy Code Sepsis Protocol  Time of code sepsis page: 1051  Antibiotics delivered at N/A  Antibiotics administered prior to code at 1041 (if checked, omit next 2 questions)  Were antibiotics ordered at the time of the code sepsis page? Yes Was it required to contact the physician?  Physician not contacted  Physician contacted to order antibiotics for code sepsis  Physician contacted to recommend changing antibiotics  Pharmacy consulted for: vancomycin and zosyn  Anti-infectives    Start     Dose/Rate Route Frequency Ordered Stop   11/27/15 1930  vancomycin (VANCOCIN) IVPB 750 mg/150 ml premix     750 mg 150 mL/hr over 60 Minutes Intravenous Every 8 hours 11/27/15 1027     11/27/15 1700  piperacillin-tazobactam (ZOSYN) IVPB 3.375 g     3.375 g 12.5 mL/hr over 240 Minutes Intravenous Every 8 hours 11/27/15 1027     11/27/15 1030  vancomycin (VANCOCIN) IVPB 1000 mg/200 mL premix     1,000 mg 200 mL/hr over 60 Minutes Intravenous  Once 11/27/15 1020     11/27/15 1030  piperacillin-tazobactam (ZOSYN) IVPB 3.375 g     3.375 g 100 mL/hr over 30 Minutes Intravenous  Once 11/27/15 1020          Nurse education provided:  Minutes left to administer antibiotics to achieve 1 hour goal  Correct order of antibiotic administration  Antibiotic Y-site compatibilities     Cori Henningsen, Drake Leach, PharmD 11/27/2015, 10:53 AM

## 2015-11-27 NOTE — ED Provider Notes (Signed)
CSN: [id number]     Arrival date & time 11/27/15  4098 History   First MD Initiated Contact with Patient 11/27/15 [phone]     Chief Complaint  Patient presents with  . Generalized Body Aches  . Fever     (Consider location/radiation/quality/duration/timing/severity/associated sxs/prior Treatment) HPI  45 y f w PMH HIV, not on any treatment who comes in with body aches/fever since Wednesday.  No other sx.  In 2011 pt was seen in clinic and was supposed to be on anti-retrovirals but has been non-compliant with HART.   Past Medical History  Diagnosis Date  . HIV (human immunodeficiency virus infection) ([facility])    History reviewed. No pertinent past surgical history. History reviewed. No pertinent family history. Social History  Substance Use Topics  . Smoking status: Never Smoker   . Smokeless tobacco: None  . Alcohol Use: No   OB History    No data available     Review of Systems  Constitutional: Positive for fever and chills.  HENT: Negative for nosebleeds.   Eyes: Negative for visual disturbance.  Respiratory: Negative for cough and shortness of breath.   Cardiovascular: Negative for chest pain.  Gastrointestinal: Negative for nausea, vomiting, abdominal pain, diarrhea and constipation.  Genitourinary: Negative for dysuria.  Musculoskeletal: Positive for myalgias.  Skin: Negative for rash.  Allergic/Immunologic: Positive for immunocompromised state.  Neurological: Negative for weakness.  All other systems reviewed and are negative.     Allergies  Review of patient's allergies indicates no known allergies.  Home Medications   Prior to Admission medications   Medication Sig Start Date End Date Taking? Authorizing Provider  oxymetazoline (AFRIN) 0.05 % nasal spray Place 1 spray into both nostrils 2 (two) times daily.   Yes Historical Provider, MD   BP 152/92 mmHg  Pulse 102  Temp(Src) 100.4 F (38 C) (Oral)  Resp 18  Ht  (1.626 m)  Wt 61.236 kg  BMI 23.16  kg/m2  SpO2 100%  LMP 11/08/2015 Physical Exam  Constitutional: She is oriented to person, place, and time. No distress.  HENT:  Head: Normocephalic and atraumatic.  Eyes: EOM are normal. Pupils are equal, round, and reactive to light.  Neck: Normal range of motion. Neck supple.  Cardiovascular: Intact distal pulses.   Sinus tachy  Pulmonary/Chest: No respiratory distress. She has no wheezes. She has no rales.  Abdominal: Soft. There is no tenderness. There is no rebound and no guarding.  Musculoskeletal: Normal range of motion.  Neurological: She is alert and oriented to person, place, and time.  Skin: No rash noted. She is not diaphoretic.  Psychiatric: She has a normal mood and affect.    ED Course  Procedures (including critical care time) Labs Review Labs Reviewed  CBC WITH DIFFERENTIAL/PLATELET - Abnormal; Notable for the following:    WBC 3.3 (*)    RBC 3.50 (*)    Hemoglobin 9.2 (*)    HCT 27.6 (*)    All other components within normal limits  COMPREHENSIVE METABOLIC PANEL - Abnormal; Notable for the following:    Sodium 131 (*)    Potassium 3.2 (*)    CO2 20 (*)    Glucose, Bld 151 (*)    Calcium 8.5 (*)    Total Protein 10.8 (*)    Albumin 2.2 (*)    AST 248 (*)    ALT 150 (*)    Alkaline Phosphatase 261 (*)    All other components within normal limits  URINALYSIS, ROUTINE  W REFLEX MICROSCOPIC (NOT AT [facility]) - Abnormal; Notable for the following:    Color, Urine AMBER (*)    APPearance CLOUDY (*)    Hgb urine dipstick SMALL (*)    Bilirubin Urine SMALL (*)    Protein, ur 100 (*)    Leukocytes, UA SMALL (*)    All other components within normal limits  URINE MICROSCOPIC-ADD ON - Abnormal; Notable for the following:    Squamous Epithelial / LPF 6-30 (*)    Bacteria, UA MANY (*)    Casts GRANULAR CAST (*)    All other components within normal limits  I-STAT CG4 LACTIC ACID, ED - Abnormal; Notable for the following:    Lactic Acid, Venous 3.39 (*)    All  other components within normal limits  URINE CULTURE  CULTURE, BLOOD (ROUTINE X 2)  CULTURE, BLOOD (ROUTINE X 2)  INFLUENZA PANEL BY PCR (TYPE A & B, H1N1)  T-HELPER CELLS (CD4) COUNT (NOT AT [facility])  HIV 1 RNA QUANT-NO REFLEX-BLD  I-STAT BETA HCG BLOOD, ED ([facility], [facility], [facility] ONLY)    Imaging Review Dg Chest 2 View  11/27/2015  CLINICAL DATA:  Upper back and shoulder pain beginning while vacuuming 2 days ago. Initial encounter. No known injury. EXAM: CHEST  2 VIEW COMPARISON:  None. FINDINGS: The lungs are clear. Heart size is normal. No pneumothorax or pleural effusion. No focal bony abnormality. IMPRESSION: No acute disease. Electronically Signed   By: Drusilla Kanner M.D.   On: 11/27/2015 09:36   I have personally reviewed and evaluated these images and lab results as part of my medical decision-making.   EKG Interpretation None      MDM   Final diagnoses:  Fever in adult    31 y f w PMH HIV, not on any treatment who comes in with body aches/fever since Wednesday.  Exam as above.  Pt well appearing.  Concern for influenza vs. Bacterial infection. Will obtain ua/cxr to eval for source of fever.  No sig HA/neck stiffness, doubt meningitis. Will obtain cbc/bmp/lactic  Lactic elevated.  Cbc/bmp unremarkable.  Will cover w/ vanc/zosyn.  Will give fluids Will obtain influenza.  Given elevated lactic, will plan to admit to medicine.    Silas Flood, MD 11/27/15 1043  Cathren Laine, MD 11/27/15 [phone]

## 2015-11-27 NOTE — Progress Notes (Signed)
Pharmacy Antibiotic Note  Allison Wilson is a 35 y.o. female admitted on 11/27/2015 with sepsis.  Pharmacy has been consulted for vancomycin and zosyn dosing. Pt with Tmax of 100.4 and WBC is low at 3.3. Scr is WNL and lactic acid is elevated. Hx HIV as well.   Plan: - Vanc 1gm IV x 1 then  IV Q8H - Zosyn 3.375gm IV Q8H (4 hr inf) - F/u renal fxn, C&S, clinical status and trough at SS  Height:  (162.6 cm) Weight: 135 lb (61.236 kg) IBW/kg (Calculated) : 54.7  Temp (24hrs), Avg:100.4 F (38 C), Min:100.4 F (38 C), Max:100.4 F (38 C)   Recent Labs Lab 11/27/15 0906 11/27/15 0909  WBC 3.3*  --   CREATININE 0.80  --   LATICACIDVEN  --  3.39*    Estimated Creatinine Clearance: 84.8 mL/min (by C-G formula based on Cr of 0.8).    No Known Allergies  Antimicrobials this admission: Vanc 2/3>> Zosyn 2/3>>  Dose adjustments this admission: N/A  Microbiology results: Pending  Thank you for allowing pharmacy to be a part of this patient's care.  Olivia Royse, Drake Leach 11/27/2015 10:28 AM

## 2015-11-27 NOTE — ED Notes (Signed)
Paged Dr. Josem Kaufmann to Rudolph, RN

## 2015-11-28 ENCOUNTER — Inpatient Hospital Stay (HOSPITAL_COMMUNITY): Payer: BLUE CROSS/BLUE SHIELD

## 2015-11-28 DIAGNOSIS — K829 Disease of gallbladder, unspecified: Secondary | ICD-10-CM

## 2015-11-28 DIAGNOSIS — R740 Nonspecific elevation of levels of transaminase and lactic acid dehydrogenase [LDH]: Secondary | ICD-10-CM

## 2015-11-28 DIAGNOSIS — D509 Iron deficiency anemia, unspecified: Secondary | ICD-10-CM

## 2015-11-28 DIAGNOSIS — R509 Fever, unspecified: Secondary | ICD-10-CM

## 2015-11-28 DIAGNOSIS — B9735 Human immunodeficiency virus, type 2 [HIV 2] as the cause of diseases classified elsewhere: Secondary | ICD-10-CM

## 2015-11-28 DIAGNOSIS — R945 Abnormal results of liver function studies: Secondary | ICD-10-CM

## 2015-11-28 DIAGNOSIS — R7989 Other specified abnormal findings of blood chemistry: Secondary | ICD-10-CM

## 2015-11-28 LAB — COMPREHENSIVE METABOLIC PANEL
ALK PHOS: 231 U/L — AB (ref 38–126)
ALT: 149 U/L — AB (ref 14–54)
ANION GAP: 10 (ref 5–15)
AST: 246 U/L — ABNORMAL HIGH (ref 15–41)
Albumin: 1.8 g/dL — ABNORMAL LOW (ref 3.5–5.0)
BILIRUBIN TOTAL: 1 mg/dL (ref 0.3–1.2)
BUN: <5 mg/dL — ABNORMAL LOW (ref 6–20)
CALCIUM: 8.4 mg/dL — AB (ref 8.9–10.3)
CO2: 18 mmol/L — AB (ref 22–32)
CREATININE: 0.66 mg/dL (ref 0.44–1.00)
Chloride: 103 mmol/L (ref 101–111)
Glucose, Bld: 76 mg/dL (ref 65–99)
Potassium: 3.3 mmol/L — ABNORMAL LOW (ref 3.5–5.1)
SODIUM: 131 mmol/L — AB (ref 135–145)
TOTAL PROTEIN: 10.1 g/dL — AB (ref 6.5–8.1)

## 2015-11-28 LAB — CBC WITH DIFFERENTIAL/PLATELET
BASOS ABS: 0 10*3/uL (ref 0.0–0.1)
BASOS PCT: 1 %
EOS ABS: 0 10*3/uL (ref 0.0–0.7)
Eosinophils Relative: 0 %
HEMATOCRIT: 24.7 % — AB (ref 36.0–46.0)
HEMOGLOBIN: 8.4 g/dL — AB (ref 12.0–15.0)
Lymphocytes Relative: 45 %
Lymphs Abs: 1.2 10*3/uL (ref 0.7–4.0)
MCH: 26.3 pg (ref 26.0–34.0)
MCHC: 34 g/dL (ref 30.0–36.0)
MCV: 77.2 fL — ABNORMAL LOW (ref 78.0–100.0)
MONOS PCT: 3 %
Monocytes Absolute: 0.1 10*3/uL (ref 0.1–1.0)
NEUTROS ABS: 1.4 10*3/uL — AB (ref 1.7–7.7)
NEUTROS PCT: 51 %
Platelets: 213 10*3/uL (ref 150–400)
RBC: 3.2 MIL/uL — ABNORMAL LOW (ref 3.87–5.11)
RDW: 13.9 % (ref 11.5–15.5)
WBC: 2.7 10*3/uL — AB (ref 4.0–10.5)

## 2015-11-28 LAB — URINE CULTURE

## 2015-11-28 LAB — HEPATITIS C ANTIBODY (REFLEX): HCV AB: 0.7 {s_co_ratio} (ref 0.0–0.9)

## 2015-11-28 LAB — HEPATITIS B SURFACE ANTIBODY,QUALITATIVE: HEP B S AB: REACTIVE

## 2015-11-28 LAB — HCV COMMENT:

## 2015-11-28 LAB — HEPATITIS B SURFACE ANTIGEN: HEP B S AG: NEGATIVE

## 2015-11-28 MED ORDER — POTASSIUM CHLORIDE CRYS ER 20 MEQ PO TBCR
40.0000 meq | EXTENDED_RELEASE_TABLET | Freq: Once | ORAL | Status: AC
  Administered 2015-11-28: 40 meq via ORAL
  Filled 2015-11-28: qty 2

## 2015-11-28 MED ORDER — DEXTROSE-NACL 5-0.9 % IV SOLN
INTRAVENOUS | Status: AC
  Administered 2015-11-28: 12:00:00 via INTRAVENOUS

## 2015-11-28 MED ORDER — POTASSIUM CHLORIDE CRYS ER 20 MEQ PO TBCR
40.0000 meq | EXTENDED_RELEASE_TABLET | Freq: Two times a day (BID) | ORAL | Status: DC
  Administered 2015-11-28 (×2): 40 meq via ORAL
  Filled 2015-11-28 (×2): qty 2

## 2015-11-28 MED ORDER — TECHNETIUM TC 99M MEBROFENIN IV KIT
5.2000 | PACK | Freq: Once | INTRAVENOUS | Status: AC | PRN
  Administered 2015-11-28: 5 via INTRAVENOUS

## 2015-11-28 NOTE — Progress Notes (Signed)
Subjective: This morning, she denies any complaints. She reports that her aches and pains that brought her into the hospital have resolved and is wanting to eat.  Objective: Vital signs in last 24 hours: Filed Vitals:   11/27/15 2056 11/28/15 0500 11/28/15 0524 11/28/15 0641  BP: 137/83  137/89   Pulse: 100  111   Temp: 99.6 F (37.6 C)  101.1 F (38.4 C) 101 F (38.3 C)  TempSrc: Oral  Oral Oral  Resp: 16  16   Height:      Weight:  138 lb 0.1 oz (62.6 kg)    SpO2: 100%  100%    Weight change:   Intake/Output Summary (Last 24 hours) at 11/28/15 1144 Last data filed at 11/28/15 0856  Gross per 24 hour  Intake 1960.83 ml  Output      0 ml  Net 1960.83 ml   General: Young [place] African female, resting in bed, NAD HEENT: oropharynx clear, unable to visualize exudate on tongue Cardiac: RRR, no rubs, murmurs or gallops Pulm: clear to auscultation bilaterally, no wheezes, rales, or rhonchi Abd: soft, nontender, nondistended, BS present Ext: warm and well perfused, no pedal edema, past scar noted overlying left shin, multiple hyperpigmented papular lesions noted on her hands Neuro: responds to questions appropriately; moving all extremities freely  Lab Results: Basic Metabolic Panel:  Recent Labs Lab 11/27/15 0906 11/28/15 0449  NA 131* 131*  K 3.2* 3.3*  CL 103 103  CO2 20* 18*  GLUCOSE 151* 76  BUN 7 <5*  CREATININE 0.80 0.66  CALCIUM 8.5* 8.4*   Liver Function Tests:  Recent Labs Lab 11/27/15 0906 11/28/15 0449  AST 248* 246*  ALT 150* 149*  ALKPHOS 261* 231*  BILITOT 0.5 1.0  PROT 10.8* 10.1*  ALBUMIN 2.2* 1.8*   CBC:  Recent Labs Lab 11/27/15 0906 11/28/15 0449  WBC 3.3* 2.7*  NEUTROABS 2.3 1.4*  HGB 9.2* 8.4*  HCT 27.6* 24.7*  MCV 78.9 77.2*  PLT 235 213   Urinalysis:  Recent Labs Lab 11/27/15 0915  COLORURINE AMBER*  LABSPEC 1.025  PHURINE 6.5  GLUCOSEU NEGATIVE  HGBUR SMALL*  BILIRUBINUR SMALL*  KETONESUR NEGATIVE    PROTEINUR 100*  NITRITE NEGATIVE  LEUKOCYTESUR SMALL*    Micro Results: Recent Results (from the past 240 hour(s))  Urine culture     Status: None   Collection Time: 11/27/15  9:15 AM  Result Value Ref Range Status   Specimen Description URINE, CLEAN CATCH  Final   Special Requests NONE  Final   Culture MULTIPLE SPECIES PRESENT, SUGGEST RECOLLECTION  Final   Report Status 11/28/2015 FINAL  Final   Studies/Results: Dg Chest 2 View  11/27/2015  CLINICAL DATA:  Upper back and shoulder pain beginning while vacuuming 2 days ago. Initial encounter. No known injury. EXAM: CHEST  2 VIEW COMPARISON:  None. FINDINGS: The lungs are clear. Heart size is normal. No pneumothorax or pleural effusion. No focal bony abnormality. IMPRESSION: No acute disease. Electronically Signed   By: Drusilla Kanner M.D.   On: 11/27/2015 09:36   US Abdomen Limited Ruq  11/27/2015  CLINICAL DATA:  Elevated LFTs, abdominal pain for 2 days, HIV EXAM: US ABDOMEN LIMITED - RIGHT UPPER QUADRANT COMPARISON:  None. FINDINGS: Gallbladder: No gallstones are noted within gallbladder. There is thickening of gallbladder wall up to 5.5 mm. Edema noted gallbladder wall. There is positive sonographic Murphy sign. Findings are suspicious for early cholecystitis. Clinical correlation is necessary. Common bile duct: Diameter:  3 mm in diameter within normal limits. Liver: No focal lesion identified. Within normal limits in parenchymal echogenicity. IMPRESSION: No gallstones are noted within gallbladder. There is thickening of gallbladder wall up to 5.5 mm. Edema noted gallbladder wall. There is positive sonographic Murphy sign. Findings are suspicious for early cholecystitis. Clinical correlation is necessary. Normal CBD. Electronically Signed   By: Natasha Mead M.D.   On: 11/27/2015 13:08   Medications: I have reviewed the patient's current medications. Scheduled Meds: . enoxaparin (LOVENOX) injection  40 mg Subcutaneous Q24H  . fluconazole   200 mg Oral Daily  . piperacillin-tazobactam (ZOSYN)  IV  3.375 g Intravenous Q8H  . sulfamethoxazole-trimethoprim  1 tablet Oral Daily   Continuous Infusions: . dextrose 5 % and 0.9% NaCl 75 mL/hr at 11/28/15 1136   PRN Meds:.ibuprofen Assessment/Plan:  Allison Wilson is a 35 year old [place] African female with poorly controlled HIV [CD4 60, viral load pending] who presented with fever and myalgias and vaginal/oral candidiasis found to have transaminitis and anema.  Fever, myalgias, transaminitis:  Acalculous cholecystitis a possibility given findings on abdominal ultrasound though does not correlate with her history.  Hepatitis B/C negative. No recent travel to [place]. Other differential given her CD4 count includes disseminated MAC, CMV, invasive fungal disease. Elevated alkaline phosphatase may be suggestive of bony involvement as seen with disseminated MAC infection. No respiratory disease as noted by the absence of symptoms and chest x-ray findings which makes pulmonary tuberculosis, PCP pneumonia unlikely. -Follow-up HIDA scan, CMV titer -Start D5 normal saline times any 5 mL/hour 12 hours since she she is without food -Gave Kdur for K 3.3 -Follow-up AFB, fungus cultures -ID & Surgery following, recommendations appreciated -Continue Zosyn for empiric GI coverage -Continue Bactrim 1 tablet for PCP prophylaxis  Poorly controlled HIV: CD4 count 60 was noted yesterday.  -Viral load, genotype pending -Need to arrange follow-up with ID clinic  Microcytic anemia: Hb 8-9 since admission, down from 10-11 back in 2011 when she was following with ID. Suspect anemia of chronic disease given co-morbid HIV infection but cannot exclude opportunistic infection as described above. -Check CBC tomorrow  Oral/vaginal candidiasis: As noted on physical exam on admission. -Continue fluconazole 200 mg daily  Dispo: Disposition is deferred at this time, awaiting improvement of current medical  problems.    The patient does have a current PCP Ginnie Smart, MD) and does not need an [facility] hospital follow-up appointment after discharge.  The patient does not know have transportation limitations that hinder transportation to clinic appointments.  .Services Needed at time of discharge: Y = Yes, Blank = No PT:   OT:   RN:   Equipment:   Other:     LOS: 1 day   Beather Arbour, MD 11/28/2015, 11:44 AM

## 2015-11-28 NOTE — Progress Notes (Signed)
Patient has been NPO since midnight last night.  Patient was informed not to eat or drink anything after midnight.  Patient requested snack this am from tech.  Tech brought patient snack to patient  around 0530.  Nurse informed after the fact that patient has am snack  Spoke with patient and reminded her that she will have to not eat again and her procedure this am will have to be pushed forward.  Will inform on-coming staff to follow up.

## 2015-11-28 NOTE — Progress Notes (Signed)
Patient ID: Allison Wilson, female   DOB: 1981/05/06, 35 y.o.   MRN: [id number]   LOS: 1 day   Subjective: Denies pain, N/V. Hungry.   Objective: Vital signs in last 24 hours: Temp:  [98.8 F (37.1 C)-102.7 F (39.3 C)] 101 F (38.3 C) (02/04 0641) Pulse Rate:  [100-111] 111 (02/04 0524) Resp:  [7-27] 16 (02/04 0524) BP: (113-150)/(83-102) 137/89 mmHg (02/04 0524) SpO2:  [99 %-100 %] 100 % (02/04 0524) Weight:  [62.6 kg (138 lb 0.1 oz)] 62.6 kg (138 lb 0.1 oz) (02/04 0500) Last BM Date: 11/26/15   Laboratory  CBC  Recent Labs  11/27/15 0906 11/28/15 0449  WBC 3.3* 2.7*  HGB 9.2* 8.4*  HCT 27.6* 24.7*  PLT 235 213   BMET  Recent Labs  11/27/15 0906 11/28/15 0449  NA 131* 131*  K 3.2* 3.3*  CL 103 103  CO2 20* 18*  GLUCOSE 151* 76  BUN 7 <5*  CREATININE 0.80 0.66  CALCIUM 8.5* 8.4*   Hepatic Function Latest Ref Rng 11/28/2015 11/27/2015 03/01/2010  Total Protein 6.5 - 8.1 g/dL 10.1(H) 10.8(H) 9.6(H)  Albumin 3.5 - 5.0 g/dL 1.8(L) 2.2(L) 3.5  AST 15 - 41 U/L 246(H) 248(H) 21  ALT 14 - 54 U/L 149(H) 150(H) 13  Alk Phosphatase 38 - 126 U/L 231(H) 261(H) 62  Total Bilirubin 0.3 - 1.2 mg/dL 1.0 0.5 0.2(L)  Bilirubin, Direct 0.0 - 0.3 mg/dL - - -    Physical Exam General appearance: alert and no distress Resp: clear to auscultation bilaterally Cardio: regular rate and rhythm GI: Soft, +BS, mild TTP RUQ, -Murphy's   Assessment/Plan: RUQ pain -- Awaiting HIDA, LFT's elevated but stable. Continue NPO.    Lisette Abu, PA-C Pager: [phone] 11/28/2015

## 2015-11-28 NOTE — Progress Notes (Signed)
Subjective: Patient reports that she is feeling better today.  She no longer has all-over body aches.  Continues to have some abdominal pain.   Per EMR, patient ate something this morning around 0530.  She was to be NPO for HIDA scan today.    Objective: Vital signs in last 24 hours: Filed Vitals:   11/27/15 2056 11/28/15 0500 11/28/15 0524 11/28/15 0641  BP: 137/83  137/89   Pulse: 100  111   Temp: 99.6 F (37.6 C)  101.1 F (38.4 C) 101 F (38.3 C)  TempSrc: Oral  Oral Oral  Resp: 16  16   Height:      Weight:  62.6 kg (138 lb 0.1 oz)    SpO2: 100%  100%    Weight change:   Intake/Output Summary (Last 24 hours) at 11/28/15 1225 Last data filed at 11/28/15 0856  Gross per 24 hour  Intake 1760.83 ml  Output      0 ml  Net 1760.83 ml   BP 137/89 mmHg  Pulse 111  Temp(Src) 101 F (38.3 C) (Oral)  Resp 16  Ht  (1.626 m)  Wt 62.6 kg (138 lb 0.1 oz)  BMI 23.68 kg/m2  SpO2 100%  LMP 11/08/2015   General Appearance:Alert, oriented, cooperative, no acute distress, appears stated age Head: Normocephalic, atraumatic Eyes: PERRL, anicteric sclera and conjunctiva, EOMs intact bilaterally Nose: Nares normal, septum midline, mucosa normal, no drainage Throat: Moist mucous membranes, no white patch visualized on tongue today Neck: Supple, symmetrical, tachea midline, no bulky adenopathy Back: Nontender to palaption of spine or paraspinal muscles, normal ROM Lungs: Clear to ausculation in all lung fields, no increased work of breathing Heart: RRR, normal S1, S2, no rub , murmur or gallop Abdomen: Soft, non-distended, tender to palpation in RUQ, normal bowel sounds, no masses or hepatosplenomegaly Extremities: No cyanosis or edema noted Pulses: 2+ and symmetric DP pulses Skin:   Right hand: numerous hyperpigmented papules overlying PIP and DIP joints  Left Leg:  A few quarter-sized hyperpigmented lesions pt reports to be old injuries from childhood  Bilateral feet:  numerous small round areas of hyperpigmentation Neuro: Moves all extremities equally, no focal neurological deficits    Lab Results: Basic Metabolic Panel:  Recent Labs  16/10/96 0906 11/28/15 0449  NA 131* 131*  K 3.2* 3.3*  CL 103 103  CO2 20* 18*  GLUCOSE 151* 76  BUN 7 <5*  CREATININE 0.80 0.66  CALCIUM 8.5* 8.4*   Liver Function Tests:  Recent Labs  11/27/15 0906 11/28/15 0449  AST 248* 246*  ALT 150* 149*  ALKPHOS 261* 231*  BILITOT 0.5 1.0  PROT 10.8* 10.1*  ALBUMIN 2.2* 1.8*   No results for input(s): LIPASE, AMYLASE in the last 72 hours. No results for input(s): AMMONIA in the last 72 hours. CBC:  Recent Labs  11/27/15 0906 11/28/15 0449  WBC 3.3* 2.7*  NEUTROABS 2.3 1.4*  HGB 9.2* 8.4*  HCT 27.6* 24.7*  MCV 78.9 77.2*  PLT 235 213   Urinalysis:  Recent Labs  11/27/15 0915  COLORURINE AMBER*  LABSPEC 1.025  PHURINE 6.5  GLUCOSEU NEGATIVE  HGBUR SMALL*  BILIRUBINUR SMALL*  KETONESUR NEGATIVE  PROTEINUR 100*  NITRITE NEGATIVE  LEUKOCYTESUR SMALL*   Misc. Labs: Hep B Surface Ag: Negative Hep B S Ab: Reactive Crypto Ag: Negative AFB culture: pending BCx: pending Fungal Cx: pending    Micro Results: Recent Results (from the past 240 hour(s))  Urine culture  Status: None   Collection Time: 11/27/15  9:15 AM  Result Value Ref Range Status   Specimen Description URINE, CLEAN CATCH  Final   Special Requests NONE  Final   Culture MULTIPLE SPECIES PRESENT, SUGGEST RECOLLECTION  Final   Report Status 11/28/2015 FINAL  Final   Studies/Results: Dg Chest 2 View  11/27/2015  CLINICAL DATA:  Upper back and shoulder pain beginning while vacuuming 2 days ago. Initial encounter. No known injury. EXAM: CHEST  2 VIEW COMPARISON:  None. FINDINGS: The lungs are clear. Heart size is normal. No pneumothorax or pleural effusion. No focal bony abnormality. IMPRESSION: No acute disease. Electronically Signed   By: Drusilla Kanner M.D.   On:  11/27/2015 09:36   US Abdomen Limited Ruq  11/27/2015  CLINICAL DATA:  Elevated LFTs, abdominal pain for 2 days, HIV EXAM: US ABDOMEN LIMITED - RIGHT UPPER QUADRANT COMPARISON:  None. FINDINGS: Gallbladder: No gallstones are noted within gallbladder. There is thickening of gallbladder wall up to 5.5 mm. Edema noted gallbladder wall. There is positive sonographic Murphy sign. Findings are suspicious for early cholecystitis. Clinical correlation is necessary. Common bile duct: Diameter: 3 mm in diameter within normal limits. Liver: No focal lesion identified. Within normal limits in parenchymal echogenicity. IMPRESSION: No gallstones are noted within gallbladder. There is thickening of gallbladder wall up to 5.5 mm. Edema noted gallbladder wall. There is positive sonographic Murphy sign. Findings are suspicious for early cholecystitis. Clinical correlation is necessary. Normal CBD. Electronically Signed   By: Natasha Mead M.D.   On: 11/27/2015 13:08   Medications: I have reviewed the patient's current medications. Scheduled Meds: . enoxaparin (LOVENOX) injection  40 mg Subcutaneous Q24H  . fluconazole  200 mg Oral Daily  . piperacillin-tazobactam (ZOSYN)  IV  3.375 g Intravenous Q8H  . sulfamethoxazole-trimethoprim  1 tablet Oral Daily   Continuous Infusions: . dextrose 5 % and 0.9% NaCl 75 mL/hr at 11/28/15 1136   PRN Meds:.ibuprofen Assessment/Plan: Principal Problem:   Fever in adult Active Problems:   AIDS (HCC)   Acalculous cholecystitis   Vaginal candidiasis  Allison Wilson is a 35 year old woman with history of HIV (not on treatment) who presents with fever and body aches who is found be febrile at 100.46F, lactic acid of 3.39, normotensive, chest XR with no acute findings, and negative influenza A and B PCR.   #Fever in Immunocompromised Adult: Patient has maintained a fever of 101.35F overnight, however patient reports feeling subjectively better than yesterday. Current differential  includes viral, bacterial, or fungal infection.  Viral etiology is likely given myalgias described, still awaiting results.  Acalculous cholecystitis is is also of concern given RUQ abdominal pain, fever, and thickened gallbladder wall with positive sonographic Murphy's sign on [place]. HIDA scan today to further evaluate.  Disseminated MAC infection also possible given low CD4 count, AFB pending at this time.  No need to repeat urine cx as patient is currently on abx and has no urinary complaints. No longer concerned about hepatitis B and C given normal lab results.  - HIDA scan today per surgery recommendations - Start D5-NS IV at 75 ml/hr while NPO for HIDA scan - Continue pip-tazo abx (day 2) - Continue ibuprofen 400 mg PO q6h prn for fever - Continue to monitor WBC and differential on am CBC with differential  - Follow up BCx - Follow up AFB  - Follow up fungal cultures - Follow up CMV DNA  #Vaginal and Oropharyngeal Candidiasis: Patient reports white vaginal  discharge for the past 3 days. Likely vaginal candidiasis based on physical exam and yeast present on UA. Yeast was also noted on her tongue. Patient has been treated for vaginal candidiasis in the past.  - Continue fluconazole 200 mg (day 2/7)  #HIV: Most recent CD4 60. Patient was diagnosed with HIV in 2007. Patient seen by Dr. Ninetta Lights from 2007-2011. Patient has not been taking HAART since 2011. Per chart review, patient was prescribed truvada 200-300 mg daily, reyataz 300 mg daily, and norvir 100 mg daily. Last HIV labs performed 03/01/10 showed HIV 1 RNA quant was 245, CD4 T Cell Abs at 200 and CD4% helper T cell at 19.  - Follow up HIV RNA quant with reflex genotype - Start bactrim per ID for prophylaxis  #Microcytic Anemia: Hgb 8-9 on this admission. Last baseline was 10-11 in 2011. Likely result of chronic HIV.  - am CBC    #Hx of Latent TB: Treated for 9 months with INH when she came to [facility] in 2007.   IVF: d5 NS at 75 ml/hr  while NPO Diet: NPO for HIDA scan   Dispo: Disposition is deferred at this time, awaiting improvement of current medical problems.    This is a Psychologist, occupational Note.  The care of the patient was discussed with Dr. Heywood Iles and the assessment and plan formulated with their assistance.  Please see their attached note for official documentation of the daily encounter.   LOS: 1 day   Lacie Scotts, Med Student 11/28/2015, 12:25 PM

## 2015-11-28 NOTE — Progress Notes (Signed)
Pt with oral temp of 103, notified Dr. Mikey Bussing.  No new orders given.  PRN ibuprofen given.  VS as charted, pt in no acute distress.  Continuing to monitor.

## 2015-11-28 NOTE — Progress Notes (Signed)
Subjective: Complaining of bilateral rib pain and malaise   Antibiotics:  Anti-infectives    Start     Dose/Rate Route Frequency Ordered Stop   11/28/15 1000  sulfamethoxazole-trimethoprim (BACTRIM DS,SEPTRA DS) 800-160 MG per tablet 1 tablet     1 tablet Oral Daily 11/27/15 2240     11/27/15 1930  vancomycin (VANCOCIN) IVPB 750 mg/150 ml premix  Status:  Discontinued     750 mg 150 mL/hr over 60 Minutes Intravenous Every 8 hours 11/27/15 1027 11/27/15 1746   11/27/15 1745  fluconazole (DIFLUCAN) tablet 200 mg     200 mg Oral Daily 11/27/15 1739 12/04/15 0959   11/27/15 1700  piperacillin-tazobactam (ZOSYN) IVPB 3.375 g     3.375 g 12.5 mL/hr over 240 Minutes Intravenous Every 8 hours 11/27/15 1027     11/27/15 1030  vancomycin (VANCOCIN) IVPB 1000 mg/200 mL premix     1,000 mg 200 mL/hr over 60 Minutes Intravenous  Once 11/27/15 1020 11/27/15 1153   11/27/15 1030  piperacillin-tazobactam (ZOSYN) IVPB 3.375 g     3.375 g 100 mL/hr over 30 Minutes Intravenous  Once 11/27/15 1020 11/27/15 1119      Medications: Scheduled Meds: . enoxaparin (LOVENOX) injection  40 mg Subcutaneous Q24H  . fluconazole  200 mg Oral Daily  . piperacillin-tazobactam (ZOSYN)  IV  3.375 g Intravenous Q8H  . potassium chloride  40 mEq Oral BID  . sulfamethoxazole-trimethoprim  1 tablet Oral Daily   Continuous Infusions: . dextrose 5 % and 0.9% NaCl 75 mL/hr at 11/28/15 1136   PRN Meds:.ibuprofen    Objective: Weight change:   Intake/Output Summary (Last 24 hours) at 11/28/15 1408 Last data filed at 11/28/15 0856  Gross per 24 hour  Intake 1760.83 ml  Output      0 ml  Net 1760.83 ml   Blood pressure 137/89, pulse 111, temperature 101 F (38.3 C), temperature source Oral, resp. rate 16, height  (1.626 m), weight 138 lb 0.1 oz (62.6 kg), last menstrual period 11/08/2015, SpO2 100 %. Temp:  [98.8 F (37.1 C)-102.7 F (39.3 C)] 101 F (38.3 C) (02/04 0641) Pulse Rate:   [100-111] 111 (02/04 0524) Resp:  [7-23] 16 (02/04 0524) BP: (113-147)/(83-102) 137/89 mmHg (02/04 0524) SpO2:  [99 %-100 %] 100 % (02/04 0524) Weight:  [138 lb 0.1 oz (62.6 kg)] 138 lb 0.1 oz (62.6 kg) (02/04 0500)  Physical Exam: General: Alert and awake, oriented x3, not in any acute distress. HEENT: anicteric sclera, pupils reactive to light and accommodation, EOMI CVS regular rate, normal r,  no murmur rubs or gallops Chest: clear to auscultation bilaterally, no wheezing, rales or rhonchi Abdomen: soft nondistended, normal bowel sounds, abdomen itself is not especially tender but she is sore on her ribs bilaterally Extremities: no  clubbing or edema noted bilaterally Skin: no rashes  Neuro: nonfocal  CBC:  CBC Latest Ref Rng 11/28/2015 11/27/2015 03/01/2010  WBC 4.0 - 10.5 K/uL 2.7(L) 3.3(L) 2.9(L)  Hemoglobin 12.0 - 15.0 g/dL 1.6(X) 0.9(U) 11.2(L)  Hematocrit 36.0 - 46.0 % 24.7(L) 27.6(L) 34.4(L)  Platelets 150 - 400 K/uL 213 235 188   \    BMET  Recent Labs  11/27/15 0906 11/28/15 0449  NA 131* 131*  K 3.2* 3.3*  CL 103 103  CO2 20* 18*  GLUCOSE 151* 76  BUN 7 <5*  CREATININE 0.80 0.66  CALCIUM 8.5* 8.4*     Liver Panel   Recent Labs  11/27/15 0906 11/28/15 0449  PROT 10.8* 10.1*  ALBUMIN 2.2* 1.8*  AST 248* 246*  ALT 150* 149*  ALKPHOS 261* 231*  BILITOT 0.5 1.0       Sedimentation Rate No results for input(s): ESRSEDRATE in the last 72 hours. C-Reactive Protein No results for input(s): CRP in the last 72 hours.  Micro Results: Recent Results (from the past 720 hour(s))  Urine culture     Status: None   Collection Time: 11/27/15  9:15 AM  Result Value Ref Range Status   Specimen Description URINE, CLEAN CATCH  Final   Special Requests NONE  Final   Culture MULTIPLE SPECIES PRESENT, SUGGEST RECOLLECTION  Final   Report Status 11/28/2015 FINAL  Final  Blood culture (routine x 2)     Status: None (Preliminary result)   Collection Time:  11/27/15 10:05 AM  Result Value Ref Range Status   Specimen Description BLOOD LEFT ANTECUBITAL  Final   Special Requests BOTTLES DRAWN AEROBIC AND ANAEROBIC 5CC  Final   Culture NO GROWTH 1 DAY  Final   Report Status PENDING  Incomplete  Blood culture (routine x 2)     Status: None (Preliminary result)   Collection Time: 11/27/15 10:10 AM  Result Value Ref Range Status   Specimen Description BLOOD RIGHT ANTECUBITAL  Final   Special Requests   Final    BOTTLES DRAWN AEROBIC AND ANAEROBIC 10CC AER 5CC ANA   Culture NO GROWTH 1 DAY  Final   Report Status PENDING  Incomplete  Fungus culture, blood     Status: None (Preliminary result)   Collection Time: 11/27/15  3:05 PM  Result Value Ref Range Status   Specimen Description BLOOD RIGHT ANTECUBITAL  Final   Special Requests BOTTLES DRAWN AEROBIC ONLY 5CC  Final   Culture NO GROWTH < 24 HOURS  Final   Report Status PENDING  Incomplete    Studies/Results: Dg Chest 2 View  11/27/2015  CLINICAL DATA:  Upper back and shoulder pain beginning while vacuuming 2 days ago. Initial encounter. No known injury. EXAM: CHEST  2 VIEW COMPARISON:  None. FINDINGS: The lungs are clear. Heart size is normal. No pneumothorax or pleural effusion. No focal bony abnormality. IMPRESSION: No acute disease. Electronically Signed   By: Drusilla Kanner M.D.   On: 11/27/2015 09:36   US Abdomen Limited Ruq  11/27/2015  CLINICAL DATA:  Elevated LFTs, abdominal pain for 2 days, HIV EXAM: US ABDOMEN LIMITED - RIGHT UPPER QUADRANT COMPARISON:  None. FINDINGS: Gallbladder: No gallstones are noted within gallbladder. There is thickening of gallbladder wall up to 5.5 mm. Edema noted gallbladder wall. There is positive sonographic Murphy sign. Findings are suspicious for early cholecystitis. Clinical correlation is necessary. Common bile duct: Diameter: 3 mm in diameter within normal limits. Liver: No focal lesion identified. Within normal limits in parenchymal echogenicity.  IMPRESSION: No gallstones are noted within gallbladder. There is thickening of gallbladder wall up to 5.5 mm. Edema noted gallbladder wall. There is positive sonographic Murphy sign. Findings are suspicious for early cholecystitis. Clinical correlation is necessary. Normal CBD. Electronically Signed   By: Natasha Mead M.D.   On: 11/27/2015 13:08      Assessment/Plan:  INTERVAL HISTORY:  11/28/15: flu PCRs negative   Principal Problem:   Fever in adult Active Problems:   AIDS (HCC)   Acalculous cholecystitis   Vaginal candidiasis    Allison Wilson is a 35 y.o. female with  HIV/AIDS who stopped her antiretrovirals when she lost  her insurance and was told by the pharmacy that she could not get her medications without paying thousands of dollars for her medicines. She was apparently completely unaware of the AIDS drug assistance program that is in place in [place]. Admitted with fevers transaminitis and gallbladder thickening.  #1 Fevers: Differential is broad and could include her HIV itself. She has ruled out for influenza and droplet precautions can be discontinued. She is on fluconazole for thrush and she is on Zosyn for possible cholangitis.  --agree with HIDA scan and will followup furrther studies --He does not have the alternating patterns of diarrhea and constipation would typically see with disseminated Mycobacterium avium infection but we need to keep an eye out for this possibility and would have a low threshold to send AFB blood cultures.  #2 HIV: I'm a bit confused as to what type of HIV this patient has better [place] documented that she had an HIV to positive antibody if there are HIV 1 ultraquant RNA s in the chart   If this is HIV 2 or she has dual infection we cannot use NNRTI therapy  I would send ultraquant HIV with reflex to genotype for HIV 1  I would send another HIV antibody to see if she has abs to HIV 2 and 1  I would like to restart her on an ARV regimen  likely Evotaz and Descovy vs Prezcobix and Descovy. I also would consider INSTI based therapy  #3 Thrush: T fluconazole  #4 possible cholangitis continue Zosyn.     LOS: 1 day   Acey Lav 11/28/2015, 2:08 PM

## 2015-11-29 ENCOUNTER — Inpatient Hospital Stay (HOSPITAL_COMMUNITY): Payer: BLUE CROSS/BLUE SHIELD

## 2015-11-29 ENCOUNTER — Encounter (HOSPITAL_COMMUNITY): Payer: Self-pay | Admitting: Radiology

## 2015-11-29 DIAGNOSIS — R1011 Right upper quadrant pain: Secondary | ICD-10-CM

## 2015-11-29 DIAGNOSIS — K759 Inflammatory liver disease, unspecified: Secondary | ICD-10-CM

## 2015-11-29 LAB — COMPREHENSIVE METABOLIC PANEL
ALK PHOS: 241 U/L — AB (ref 38–126)
ALT: 217 U/L — AB (ref 14–54)
AST: 393 U/L — AB (ref 15–41)
Albumin: 2 g/dL — ABNORMAL LOW (ref 3.5–5.0)
Anion gap: 5 (ref 5–15)
BILIRUBIN TOTAL: 1.9 mg/dL — AB (ref 0.3–1.2)
CALCIUM: 8.6 mg/dL — AB (ref 8.9–10.3)
CHLORIDE: 106 mmol/L (ref 101–111)
CO2: 20 mmol/L — ABNORMAL LOW (ref 22–32)
CREATININE: 0.7 mg/dL (ref 0.44–1.00)
GFR calc Af Amer: >60 mL/min (ref >60)
Glucose, Bld: 85 mg/dL (ref 65–99)
Potassium: 5 mmol/L (ref 3.5–5.1)
Sodium: 131 mmol/L — ABNORMAL LOW (ref 135–145)
Total Protein: 9.8 g/dL — ABNORMAL HIGH (ref 6.5–8.1)

## 2015-11-29 LAB — CBC
HEMATOCRIT: 26.7 % — AB (ref 36.0–46.0)
HEMOGLOBIN: 8.9 g/dL — AB (ref 12.0–15.0)
MCH: 25.8 pg — AB (ref 26.0–34.0)
MCHC: 33.3 g/dL (ref 30.0–36.0)
MCV: 77.4 fL — AB (ref 78.0–100.0)
PLATELETS: 208 10*3/uL (ref 150–400)
RBC: 3.45 MIL/uL — AB (ref 3.87–5.11)
RDW: 14 % (ref 11.5–15.5)
WBC: 4.2 10*3/uL (ref 4.0–10.5)

## 2015-11-29 MED ORDER — DEXTROSE-NACL 5-0.9 % IV SOLN
INTRAVENOUS | Status: DC
  Administered 2015-11-29: 08:00:00 via INTRAVENOUS

## 2015-11-29 MED ORDER — IOHEXOL 350 MG/ML SOLN
100.0000 mL | Freq: Once | INTRAVENOUS | Status: AC | PRN
  Administered 2015-11-29: 100 mL via INTRAVENOUS

## 2015-11-29 NOTE — Progress Notes (Signed)
Pharmacy Antibiotic Note Allison Wilson is a 35 y.o. female admitted on 11/27/2015 with sepsis in setting of HIV off ART therapy for last 7 years. Currently on day 3 of Zosyn.  Plan: 1. Continue Zosyn 3.375 grams every 8 hours  2. Await pending micro data and ID recommendations  3. Following along with you daily   Height:  (162.6 cm) Weight: 135 lb 5.8 oz (61.4 kg) IBW/kg (Calculated) : 54.7  Temp (24hrs), Avg:101 F (38.3 C), Min:99.4 F (37.4 C), Max:103 F (39.4 C)   Recent Labs Lab 11/27/15 0906 11/27/15 0909 11/27/15 1208 11/28/15 0449 11/29/15 0324  WBC 3.3*  --   --  2.7* 4.2  CREATININE 0.80  --   --  0.66 0.70  LATICACIDVEN  --  3.39* 0.77  --   --      No Known Allergies  Antimicrobials this admission: 2/3 Zosyn >>  2/3 Fluconazole (for oral thrush) >> 2/10  2/3 Bactrim DS >> 2/3 Vancomycin x 1 dose   Dose adjustments this admission: n/a  Microbiology results: 2/3 BCx: px  2/3 UCx: "multiple species present"  2/3 Flu: neg  2/3 Crypto antigen: neg   Thank you for allowing pharmacy to be a part of this patient's care.  Pollyann Samples, PharmD, BCPS 11/29/2015, 11:09 AM Pager: [phone]

## 2015-11-29 NOTE — Progress Notes (Signed)
Subjective: Overnight, she had temperature 102.6 Fahrenheit which defervesced with ibuprofen. This morning, she reports feeling well.  Objective: Vital signs in last 24 hours: Filed Vitals:   11/28/15 2145 11/29/15 0333 11/29/15 0500 11/29/15 0621  BP:    115/70  Pulse:    118  Temp: 102.6 F (39.2 C) 99.4 F (37.4 C)  100.2 F (37.9 C)  TempSrc: Oral Oral  Oral  Resp:    18  Height:      Weight:   135 lb 5.8 oz (61.4 kg)   SpO2:    100%   Weight change: 5.8 oz (0.164 kg)  Intake/Output Summary (Last 24 hours) at 11/29/15 0973 Last data filed at 11/29/15 5329  Gross per 24 hour  Intake   2065 ml  Output      0 ml  Net   2065 ml   General: Allison Wilson African female, resting in bed, NAD HEENT: oropharynx clear, no exudates noted on the tongue Cardiac: RRR, no rubs, murmurs or gallops Pulm: clear to auscultation bilaterally, no wheezes, rales, or rhonchi Abd: soft, nontender, nondistended, BS present Ext: warm and well perfused, no pedal edema, past scar noted overlying left shin, multiple hyperpigmented papular lesions noted on her hands Neuro: responds to questions appropriately; moving all extremities freely  Lab Results: Basic Metabolic Panel:  Recent Labs Lab 11/28/15 0449 11/29/15 0324  NA 131* 131*  K 3.3* 5.0  CL 103 106  CO2 18* 20*  GLUCOSE 76 85  BUN <5* <5*  CREATININE 0.66 0.70  CALCIUM 8.4* 8.6*   Liver Function Tests:  Recent Labs Lab 11/28/15 0449 11/29/15 0324  AST 246* 393*  ALT 149* 217*  ALKPHOS 231* 241*  BILITOT 1.0 1.9*  PROT 10.1* 9.8*  ALBUMIN 1.8* 2.0*   CBC:  Recent Labs Lab 11/27/15 0906 11/28/15 0449 11/29/15 0324  WBC 3.3* 2.7* 4.2  NEUTROABS 2.3 1.4*  --   HGB 9.2* 8.4* 8.9*  HCT 27.6* 24.7* 26.7*  MCV 78.9 77.2* 77.4*  PLT 235 213 208   Urinalysis:  Recent Labs Lab 11/27/15 0915  COLORURINE AMBER*  LABSPEC 1.025  PHURINE 6.5  GLUCOSEU NEGATIVE  HGBUR SMALL*  BILIRUBINUR SMALL*  KETONESUR  NEGATIVE  PROTEINUR 100*  NITRITE NEGATIVE  LEUKOCYTESUR SMALL*    Micro Results: Recent Results (from the past 240 hour(s))  Urine culture     Status: None   Collection Time: 11/27/15  9:15 AM  Result Value Ref Range Status   Specimen Description URINE, CLEAN CATCH  Final   Special Requests NONE  Final   Culture MULTIPLE SPECIES PRESENT, SUGGEST RECOLLECTION  Final   Report Status 11/28/2015 FINAL  Final  Blood culture (routine x 2)     Status: None (Preliminary result)   Collection Time: 11/27/15 10:05 AM  Result Value Ref Range Status   Specimen Description BLOOD LEFT ANTECUBITAL  Final   Special Requests BOTTLES DRAWN AEROBIC AND ANAEROBIC 5CC  Final   Culture NO GROWTH 1 DAY  Final   Report Status PENDING  Incomplete  Blood culture (routine x 2)     Status: None (Preliminary result)   Collection Time: 11/27/15 10:10 AM  Result Value Ref Range Status   Specimen Description BLOOD RIGHT ANTECUBITAL  Final   Special Requests   Final    BOTTLES DRAWN AEROBIC AND ANAEROBIC 10CC AER 5CC ANA   Culture NO GROWTH 1 DAY  Final   Report Status PENDING  Incomplete  Fungus culture, blood  Status: None (Preliminary result)   Collection Time: 11/27/15  3:05 PM  Result Value Ref Range Status   Specimen Description BLOOD RIGHT ANTECUBITAL  Final   Special Requests BOTTLES DRAWN AEROBIC ONLY 5CC  Final   Culture NO GROWTH < 24 HOURS  Final   Report Status PENDING  Incomplete   Studies/Results: Nm Hepatobiliary Including Gb  11/28/2015  CLINICAL DATA:  RIGHT upper quadrant pain. HIV and hepatitis 8. Gallbladder wall thickening on ultrasound. Normal common bile duct. Normal bilirubin. EXAM: NUCLEAR MEDICINE HEPATOBILIARY IMAGING TECHNIQUE: Sequential images of the abdomen were obtained out to 60 minutes following intravenous administration of radiopharmaceutical. RADIOPHARMACEUTICALS:  5.2 mCi Tc-36m  Choletec IV COMPARISON:  None. FINDINGS: There is relatively rapid clearance of  radiotracer from the blood pool and homogeneous uptake in the liver. There is poor clearance of counts from the liver parenchyma over the 2 hour imaging. The gallbladder is evident by 60 minutes and continues to fill up to 120 minutes. Trace amount of bile activity is noted in the bowel. IMPRESSION: 1. Patent cystic duct. 2. Poor excretion of radiotracer suggest liver dysfunction / hepatitis. Findings conveyed toDr.  Nena Alexander 11/28/2015  [id number]. Electronically Signed   By: Genevive Bi M.D.   On: 11/28/2015 16:06   US Abdomen Limited Ruq  11/27/2015  CLINICAL DATA:  Elevated LFTs, abdominal pain for 2 days, HIV EXAM: US ABDOMEN LIMITED - RIGHT UPPER QUADRANT COMPARISON:  None. FINDINGS: Gallbladder: No gallstones are noted within gallbladder. There is thickening of gallbladder wall up to 5.5 mm. Edema noted gallbladder wall. There is positive sonographic Murphy sign. Findings are suspicious for early cholecystitis. Clinical correlation is necessary. Common bile duct: Diameter: 3 mm in diameter within normal limits. Liver: No focal lesion identified. Within normal limits in parenchymal echogenicity. IMPRESSION: No gallstones are noted within gallbladder. There is thickening of gallbladder wall up to 5.5 mm. Edema noted gallbladder wall. There is positive sonographic Murphy sign. Findings are suspicious for early cholecystitis. Clinical correlation is necessary. Normal CBD. Electronically Signed   By: Natasha Mead M.D.   On: 11/27/2015 13:08   Medications: I have reviewed the patient's current medications. Scheduled Meds: . enoxaparin (LOVENOX) injection  40 mg Subcutaneous Q24H  . fluconazole  200 mg Oral Daily  . piperacillin-tazobactam (ZOSYN)  IV  3.375 g Intravenous Q8H  . sulfamethoxazole-trimethoprim  1 tablet Oral Daily   Continuous Infusions: . dextrose 5 % and 0.9% NaCl 100 mL/hr at 11/29/15 0824   PRN Meds:.ibuprofen Assessment/Plan:  Allison Wilson is a 35 year old [place] African female with  poorly controlled HIV [CD4 60, viral load pending] who presented with fever and myalgias and vaginal/oral candidiasis found to have transaminitis, anemia but without signs of acute cholecystitis on HIDA scan.  Fever, myalgias, transaminitis:  Though cholecystitis was not visualized on HIDA scan yesterday, hepatic function did appear abnormal.  Hepatitis B/C negative. No recent travel to [place]. Other differential given her CD4 count includes disseminated MAC, CMV, invasive fungal disease though she continues to deny any abdominal symptoms. Elevated alkaline phosphatase may be suggestive of bony involvement as seen with disseminated MAC infection. No respiratory disease as noted by the absence of symptoms and chest x-ray findings which makes pulmonary tuberculosis, PCP pneumonia unlikely.  -Follow-up CMV titer -Restarted regular diet -Follow-up AFB, fungus cultures (2/3); no growth to date currently -Follow-up HIV Ab and genotype  -ID following, recommendations appreciated -Continue Zosyn for empiric GI coverage -Continue Bactrim 1 tablet for PCP prophylaxis  Poorly controlled  HIV: CD4 count 60 was noted yesterday.  -Viral load, genotype pending -Need to arrange follow-up with ID clinic  Microcytic anemia: Hb 8-9 since admission, down from 10-11 back in 2011 when she was following with ID. Suspect anemia of chronic disease given co-morbid HIV infection but cannot exclude opportunistic infection as described above. -Recheck CBC with differential tomorrow  Oral/vaginal candidiasis: As noted on physical exam on admission. -Continue fluconazole 200 mg daily  Dispo: Disposition is deferred at this time, awaiting improvement of current medical problems.    The patient does have a current PCP Allison Smart, MD) and does not need an [facility] follow-up appointment after discharge.  The patient does not know have transportation limitations that hinder transportation to clinic  appointments.  .Services Needed at time of discharge: Y = Yes, Blank = No PT:   OT:   RN:   Equipment:   Other:     LOS: 2 days   Beather Arbour, MD 11/29/2015, 9:37 AM

## 2015-11-29 NOTE — Progress Notes (Signed)
Subjective: She denies abdominal pain Reports rib pain bilaterally  Objective: Vital signs in last 24 hours: Temp:  [99.4 F (37.4 C)-103 F (39.4 C)] 100.2 F (37.9 C) (02/05 0621) Pulse Rate:  [100-118] 118 (02/05 0621) Resp:  [18] 18 (02/05 0621) BP: (115-149)/(70-98) 115/70 mmHg (02/05 0621) SpO2:  [100 %] 100 % (02/05 0621) Weight:  [61.4 kg (135 lb 5.8 oz)] 61.4 kg (135 lb 5.8 oz) (02/05 0500) Last BM Date: 11/26/15  Intake/Output from previous day: 02/04 0701 - 02/05 0700 In: 2015 [P.O.:960; I.V.:855; IV Piggyback:200] Out: -  Intake/Output this shift:    Abdomen soft, non tender  Lab Results:   Recent Labs  11/28/15 0449 11/29/15 0324  WBC 2.7* 4.2  HGB 8.4* 8.9*  HCT 24.7* 26.7*  PLT 213 208   BMET  Recent Labs  11/28/15 0449 11/29/15 0324  NA 131* 131*  K 3.3* 5.0  CL 103 106  CO2 18* 20*  GLUCOSE 76 85  BUN <5* <5*  CREATININE 0.66 0.70  CALCIUM 8.4* 8.6*   PT/INR No results for input(s): LABPROT, INR in the last 72 hours. ABG No results for input(s): PHART, HCO3 in the last 72 hours.  Invalid input(s): PCO2, PO2  Studies/Results: Dg Chest 2 View  11/27/2015  CLINICAL DATA:  Upper back and shoulder pain beginning while vacuuming 2 days ago. Initial encounter. No known injury. EXAM: CHEST  2 VIEW COMPARISON:  None. FINDINGS: The lungs are clear. Heart size is normal. No pneumothorax or pleural effusion. No focal bony abnormality. IMPRESSION: No acute disease. Electronically Signed   By: Drusilla Kanner M.D.   On: 11/27/2015 09:36   Nm Hepatobiliary Including Gb  11/28/2015  CLINICAL DATA:  RIGHT upper quadrant pain. HIV and hepatitis 8. Gallbladder wall thickening on ultrasound. Normal common bile duct. Normal bilirubin. EXAM: NUCLEAR MEDICINE HEPATOBILIARY IMAGING TECHNIQUE: Sequential images of the abdomen were obtained out to 60 minutes following intravenous administration of radiopharmaceutical. RADIOPHARMACEUTICALS:  5.2 mCi Tc-54m   Choletec IV COMPARISON:  None. FINDINGS: There is relatively rapid clearance of radiotracer from the blood pool and homogeneous uptake in the liver. There is poor clearance of counts from the liver parenchyma over the 2 hour imaging. The gallbladder is evident by 60 minutes and continues to fill up to 120 minutes. Trace amount of bile activity is noted in the bowel. IMPRESSION: 1. Patent cystic duct. 2. Poor excretion of radiotracer suggest liver dysfunction / hepatitis. Findings conveyed toDr.  Nena Alexander 11/28/2015  [id number]. Electronically Signed   By: Genevive Bi M.D.   On: 11/28/2015 16:06   US Abdomen Limited Ruq  11/27/2015  CLINICAL DATA:  Elevated LFTs, abdominal pain for 2 days, HIV EXAM: US ABDOMEN LIMITED - RIGHT UPPER QUADRANT COMPARISON:  None. FINDINGS: Gallbladder: No gallstones are noted within gallbladder. There is thickening of gallbladder wall up to 5.5 mm. Edema noted gallbladder wall. There is positive sonographic Murphy sign. Findings are suspicious for early cholecystitis. Clinical correlation is necessary. Common bile duct: Diameter: 3 mm in diameter within normal limits. Liver: No focal lesion identified. Within normal limits in parenchymal echogenicity. IMPRESSION: No gallstones are noted within gallbladder. There is thickening of gallbladder wall up to 5.5 mm. Edema noted gallbladder wall. There is positive sonographic Murphy sign. Findings are suspicious for early cholecystitis. Clinical correlation is necessary. Normal CBD. Electronically Signed   By: Natasha Mead M.D.   On: 11/27/2015 13:08    Anti-infectives: Anti-infectives    Start     Dose/Rate  Route Frequency Ordered Stop   11/28/15 1000  sulfamethoxazole-trimethoprim (BACTRIM DS,SEPTRA DS) 800-160 MG per tablet 1 tablet     1 tablet Oral Daily 11/27/15 2240     11/27/15 1930  vancomycin (VANCOCIN) IVPB 750 mg/150 ml premix  Status:  Discontinued     750 mg 150 mL/hr over 60 Minutes Intravenous Every 8 hours 11/27/15  1027 11/27/15 1746   11/27/15 1745  fluconazole (DIFLUCAN) tablet 200 mg     200 mg Oral Daily 11/27/15 1739 12/04/15 0959   11/27/15 1700  piperacillin-tazobactam (ZOSYN) IVPB 3.375 g     3.375 g 12.5 mL/hr over 240 Minutes Intravenous Every 8 hours 11/27/15 1027     11/27/15 1030  vancomycin (VANCOCIN) IVPB 1000 mg/200 mL premix     1,000 mg 200 mL/hr over 60 Minutes Intravenous  Once 11/27/15 1020 11/27/15 1153   11/27/15 1030  piperacillin-tazobactam (ZOSYN) IVPB 3.375 g     3.375 g 100 mL/hr over 30 Minutes Intravenous  Once 11/27/15 1020 11/27/15 1119      Assessment/Plan:  Fever, thickened gallbladder wall  HIDA scan negative with no evidence of cholecystitis.  Ultrasound shows no gallstones.  No plans for cholecystectomy given exam and findings. Will sign off for now  Selen Smucker A 11/29/2015

## 2015-11-29 NOTE — Progress Notes (Addendum)
Subjective: Complaining of bilateral rib pain and malaise but better today   Antibiotics:  Anti-infectives    Start     Dose/Rate Route Frequency Ordered Stop   11/28/15 1000  sulfamethoxazole-trimethoprim (BACTRIM DS,SEPTRA DS) 800-160 MG per tablet 1 tablet     1 tablet Oral Daily 11/27/15 2240     11/27/15 1930  vancomycin (VANCOCIN) IVPB 750 mg/150 ml premix  Status:  Discontinued     750 mg 150 mL/hr over 60 Minutes Intravenous Every 8 hours 11/27/15 1027 11/27/15 1746   11/27/15 1745  fluconazole (DIFLUCAN) tablet 200 mg     200 mg Oral Daily 11/27/15 1739 12/04/15 0959   11/27/15 1700  piperacillin-tazobactam (ZOSYN) IVPB 3.375 g     3.375 g 12.5 mL/hr over 240 Minutes Intravenous Every 8 hours 11/27/15 1027     11/27/15 1030  vancomycin (VANCOCIN) IVPB 1000 mg/200 mL premix     1,000 mg 200 mL/hr over 60 Minutes Intravenous  Once 11/27/15 1020 11/27/15 1153   11/27/15 1030  piperacillin-tazobactam (ZOSYN) IVPB 3.375 g     3.375 g 100 mL/hr over 30 Minutes Intravenous  Once 11/27/15 1020 11/27/15 1119      Medications: Scheduled Meds: . enoxaparin (LOVENOX) injection  40 mg Subcutaneous Q24H  . fluconazole  200 mg Oral Daily  . piperacillin-tazobactam (ZOSYN)  IV  3.375 g Intravenous Q8H  . sulfamethoxazole-trimethoprim  1 tablet Oral Daily   Continuous Infusions: . dextrose 5 % and 0.9% NaCl 100 mL/hr at 11/29/15 0824   PRN Meds:.ibuprofen    Objective: Weight change: 5.8 oz (0.164 kg)  Intake/Output Summary (Last 24 hours) at 11/29/15 1613 Last data filed at 11/29/15 1344  Gross per 24 hour  Intake   2595 ml  Output      0 ml  Net   2595 ml   Blood pressure 121/77, pulse 102, temperature 99.8 F (37.7 C), temperature source Oral, resp. rate 18, height  (1.626 m), weight 135 lb 5.8 oz (61.4 kg), last menstrual period 11/08/2015, SpO2 100 %. Temp:  [99.4 F (37.4 C)-103 F (39.4 C)] 99.8 F (37.7 C) (02/05 1300) Pulse Rate:  [102-118]  102 (02/05 1300) Resp:  [18] 18 (02/05 1300) BP: (115-149)/(70-98) 121/77 mmHg (02/05 1300) SpO2:  [100 %] 100 % (02/05 1300) Weight:  [135 lb 5.8 oz (61.4 kg)] 135 lb 5.8 oz (61.4 kg) (02/05 0500)  Physical Exam: General: Alert and awake, oriented x3, not in any acute distress. HEENT: anicteric sclera, pupils reactive to light and accommodation, EOMI CVS regular rate, normal r,  no murmur rubs or gallops Chest: clear to auscultation bilaterally, no wheezing, rales or rhonchi Abdomen: soft nondistended, normal bowel sounds, abdomen itself is not  tender but she is sore on her ribs bilaterally Extremities: no  clubbing or edema noted bilaterally Skin: no rashes  Neuro: nonfocal  CBC:  CBC Latest Ref Rng 11/29/2015 11/28/2015 11/27/2015  WBC 4.0 - 10.5 K/uL 4.2 2.7(L) 3.3(L)  Hemoglobin 12.0 - 15.0 g/dL 5.4(U) 9.8(J) 1.9(J)  Hematocrit 36.0 - 46.0 % 26.7(L) 24.7(L) 27.6(L)  Platelets 150 - 400 K/uL 208 213 235   \    BMET  Recent Labs  11/28/15 0449 11/29/15 0324  NA 131* 131*  K 3.3* 5.0  CL 103 106  CO2 18* 20*  GLUCOSE 76 85  BUN <5* <5*  CREATININE 0.66 0.70  CALCIUM 8.4* 8.6*     Liver Panel   Recent Labs  11/28/15 0449 11/29/15 0324  PROT 10.1* 9.8*  ALBUMIN 1.8* 2.0*  AST 246* 393*  ALT 149* 217*  ALKPHOS 231* 241*  BILITOT 1.0 1.9*       Sedimentation Rate No results for input(s): ESRSEDRATE in the last 72 hours. C-Reactive Protein No results for input(s): CRP in the last 72 hours.  Micro Results: Recent Results (from the past 720 hour(s))  Urine culture     Status: None   Collection Time: 11/27/15  9:15 AM  Result Value Ref Range Status   Specimen Description URINE, CLEAN CATCH  Final   Special Requests NONE  Final   Culture MULTIPLE SPECIES PRESENT, SUGGEST RECOLLECTION  Final   Report Status 11/28/2015 FINAL  Final  Blood culture (routine x 2)     Status: None (Preliminary result)   Collection Time: 11/27/15 10:05 AM  Result Value Ref  Range Status   Specimen Description BLOOD LEFT ANTECUBITAL  Final   Special Requests BOTTLES DRAWN AEROBIC AND ANAEROBIC 5CC  Final   Culture NO GROWTH 1 DAY  Final   Report Status PENDING  Incomplete  Blood culture (routine x 2)     Status: None (Preliminary result)   Collection Time: 11/27/15 10:10 AM  Result Value Ref Range Status   Specimen Description BLOOD RIGHT ANTECUBITAL  Final   Special Requests   Final    BOTTLES DRAWN AEROBIC AND ANAEROBIC 10CC AER 5CC ANA   Culture NO GROWTH 1 DAY  Final   Report Status PENDING  Incomplete  Fungus culture, blood     Status: None (Preliminary result)   Collection Time: 11/27/15  3:05 PM  Result Value Ref Range Status   Specimen Description BLOOD RIGHT ANTECUBITAL  Final   Special Requests BOTTLES DRAWN AEROBIC ONLY 5CC  Final   Culture NO GROWTH < 24 HOURS  Final   Report Status PENDING  Incomplete    Studies/Results: Nm Hepatobiliary Including Gb  11/28/2015  CLINICAL DATA:  RIGHT upper quadrant pain. HIV and hepatitis 8. Gallbladder wall thickening on ultrasound. Normal common bile duct. Normal bilirubin. EXAM: NUCLEAR MEDICINE HEPATOBILIARY IMAGING TECHNIQUE: Sequential images of the abdomen were obtained out to 60 minutes following intravenous administration of radiopharmaceutical. RADIOPHARMACEUTICALS:  5.2 mCi Tc-21m  Choletec IV COMPARISON:  None. FINDINGS: There is relatively rapid clearance of radiotracer from the blood pool and homogeneous uptake in the liver. There is poor clearance of counts from the liver parenchyma over the 2 hour imaging. The gallbladder is evident by 60 minutes and continues to fill up to 120 minutes. Trace amount of bile activity is noted in the bowel. IMPRESSION: 1. Patent cystic duct. 2. Poor excretion of radiotracer suggest liver dysfunction / hepatitis. Findings conveyed toDr.  Nena Alexander 11/28/2015  [id number]. Electronically Signed   By: Genevive Bi M.D.   On: 11/28/2015 16:06       Assessment/Plan:  INTERVAL HISTORY:  11/28/15: flu PCRs negative   Principal Problem:   Fever in adult Active Problems:   AIDS (HCC)   Acalculous cholecystitis   Vaginal candidiasis   Gallbladder disease   LFT elevation   FUO (fever of unknown origin)   HIV 2 (human immunodeficiency virus type 2) (HCC)    Allison Wilson is a 35 y.o. female with  HIV/AIDS who stopped her antiretrovirals when she lost her insurance and was told by the pharmacy that she could not get her medications without paying thousands of dollars for her medicines. She was apparently completely unaware of the AIDS drug  assistance program that is in place in [place]. Admitted with fevers transaminitis and gallbladder thickening.  #1 Fevers: Differential is broad and could include her HIV itself. She has ruled out for influenza and droplet precautions can be discontinued. She is on fluconazole for thrush and she is on Zosyn for possible cholangitis. HIDA unrevealing. CT triple phase being pursued --fine to continue zosyn for now but unless the 3 phase shows liver abscess or something that we clearly need to treat would dc it fairly soon --CMV PCRs pending  #2 HIV: I'm a bit confused as to what type of HIV this patient has better [facility] documented that she had an HIV to positive antibody if there are HIV 1 ultraquant RNA s in the chart   If this is HIV 2 or she has dual infection we cannot use NNRTI therapy  I would send ultraquant HIV with reflex to genotype for HIV 1  I would send another HIV antibody to see if she has abs to HIV 2 and 1  I will  ask [facility] if we have pharmacy "match" program for the patient as for new patients. I believe we use Tivicay and Descovy in that program which would highly likely be just fine as well sine pt does not show pattern of virological failure with R in the past.  If not a perhaps a PI based regimen such as PREZCOBIX and Descovy  Otherwise she will need to  come to our clinic for [facility] enrollment to get her ARVS quickly and then ADAP for long term solution  Note Evotaz and Reyataz are not covered under [facility]  #3 Thrush: T fluconazole  #4 Elevated LFTs have not changed much so far   Dr. Drue Second is back tomorrow.     LOS: 2 days   Acey Lav 11/29/2015, 4:13 PM

## 2015-11-29 NOTE — Progress Notes (Signed)
Utilization review completed.  

## 2015-11-30 ENCOUNTER — Inpatient Hospital Stay (HOSPITAL_COMMUNITY): Payer: BLUE CROSS/BLUE SHIELD

## 2015-11-30 DIAGNOSIS — D6489 Other specified anemias: Secondary | ICD-10-CM

## 2015-11-30 DIAGNOSIS — D649 Anemia, unspecified: Secondary | ICD-10-CM

## 2015-11-30 LAB — CBC WITH DIFFERENTIAL/PLATELET
BASOS ABS: 0 10*3/uL (ref 0.0–0.1)
Basophils Relative: 1 %
EOS ABS: 0 10*3/uL (ref 0.0–0.7)
EOS PCT: 0 %
HCT: 25.1 % — ABNORMAL LOW (ref 36.0–46.0)
Hemoglobin: 8.5 g/dL — ABNORMAL LOW (ref 12.0–15.0)
LYMPHS ABS: 1 10*3/uL (ref 0.7–4.0)
Lymphocytes Relative: 25 %
MCH: 25.8 pg — AB (ref 26.0–34.0)
MCHC: 33.9 g/dL (ref 30.0–36.0)
MCV: 76.3 fL — ABNORMAL LOW (ref 78.0–100.0)
Monocytes Absolute: 0.3 10*3/uL (ref 0.1–1.0)
Monocytes Relative: 7 %
Neutro Abs: 2.6 10*3/uL (ref 1.7–7.7)
Neutrophils Relative %: 67 %
PLATELETS: 189 10*3/uL (ref 150–400)
RBC: 3.29 MIL/uL — AB (ref 3.87–5.11)
RDW: 14 % (ref 11.5–15.5)
WBC: 3.9 10*3/uL — AB (ref 4.0–10.5)

## 2015-11-30 LAB — COMPREHENSIVE METABOLIC PANEL
ALT: 259 U/L — AB (ref 14–54)
AST: 505 U/L — AB (ref 15–41)
Albumin: 1.9 g/dL — ABNORMAL LOW (ref 3.5–5.0)
Alkaline Phosphatase: 239 U/L — ABNORMAL HIGH (ref 38–126)
Anion gap: 8 (ref 5–15)
BILIRUBIN TOTAL: 2.3 mg/dL — AB (ref 0.3–1.2)
BUN: 6 mg/dL (ref 6–20)
CALCIUM: 8.6 mg/dL — AB (ref 8.9–10.3)
CO2: 20 mmol/L — ABNORMAL LOW (ref 22–32)
CREATININE: 0.74 mg/dL (ref 0.44–1.00)
Chloride: 101 mmol/L (ref 101–111)
GFR calc Af Amer: >60 mL/min (ref >60)
Glucose, Bld: 93 mg/dL (ref 65–99)
Potassium: 3.6 mmol/L (ref 3.5–5.1)
Sodium: 129 mmol/L — ABNORMAL LOW (ref 135–145)
TOTAL PROTEIN: 9.8 g/dL — AB (ref 6.5–8.1)

## 2015-11-30 LAB — TECHNOLOGIST SMEAR REVIEW: TECH REVIEW: INCREASED

## 2015-11-30 LAB — PROTIME-INR
INR: 1.32 (ref 0.00–1.49)
Prothrombin Time: 16.6 seconds — ABNORMAL HIGH (ref 11.6–15.2)

## 2015-11-30 MED ORDER — GADOBENATE DIMEGLUMINE 529 MG/ML IV SOLN
15.0000 mL | Freq: Once | INTRAVENOUS | Status: AC | PRN
  Administered 2015-11-30: 15 mL via INTRAVENOUS

## 2015-11-30 MED ORDER — IBUPROFEN 200 MG PO TABS
400.0000 mg | ORAL_TABLET | ORAL | Status: DC | PRN
  Administered 2015-12-01 (×3): 400 mg via ORAL
  Filled 2015-11-30 (×3): qty 2

## 2015-11-30 NOTE — Progress Notes (Signed)
Subjective: Patient had fever to 102.2 F overnight that defervesced with ibuprofen.  Patient reports that she is feeling well today. Denies recent weight loss or change in appetite preceding her hospitalization.    Objective: Vital signs in last 24 hours: Filed Vitals:   11/29/15 2146 11/30/15 0000 11/30/15 0500 11/30/15 0547  BP:    113/63  Pulse:    98  Temp: 102.2 F (39 C) 100 F (37.8 C)  98.7 F (37.1 C)  TempSrc: Oral Oral  Oral  Resp:    18  Height:      Weight:   62.8 kg (138 lb 7.2 oz)   SpO2:    99%   Weight change: 1.4 kg (3 lb 1.4 oz)  Intake/Output Summary (Last 24 hours) at 11/30/15 0937 Last data filed at 11/29/15 1344  Gross per 24 hour  Intake    480 ml  Output      0 ml  Net    480 ml   BP 113/63 mmHg  Pulse 98  Temp(Src) 98.7 F (37.1 C) (Oral)  Resp 18  Ht 5' 4"  (1.626 m)  Wt 62.8 kg (138 lb 7.2 oz)  BMI 23.75 kg/m2  SpO2 99%  LMP 11/08/2015  Physical Exam:  General Appearance: Young woman, sitting up in recliner, alert, pleasant, no acute distress Head: Normocephalic, atruamatic EENT:: PERRL, conjunctiva clear bilaterally, EOM intact bilaterally, no nasal drainage, patent nares bilaterally, moist mucous membranes, no lymphadenopathy Lungs: clear to auscultation bilaterally, no increased work of breathing Heart: Regular rate and rhythm, no rub murmur or gallop, normal S1 and S2 Abdomen: Soft, non-distended, tender to palpation of RUQ >LUQ, no masses or hepatosplenomegaly noted Extremities: no cyanosis or edema Pulses: 2+ bilaterally Skin:multiple small verrucous lesions on right hand, old scars noted to left anterior tibia, multiple areas of small round hyperpigmentation to the soles of feet bilaterally Lymph Nodes: Cervical, subclavicular, and axillary nodes normal Neuro: No focal deficits, moves all extremities equally.  Alert and oriented   Lab Results: Basic Metabolic Panel:  Recent Labs Lab 11/29/15 0324 11/30/15 0528  NA 131*  129*  K 5.0 3.6  CL 106 101  CO2 20* 20*  GLUCOSE 85 93  BUN <5* 6  CREATININE 0.70 0.74  CALCIUM 8.6* 8.6*   Liver Function Tests:  Recent Labs Lab 11/29/15 0324 11/30/15 0528  AST 393* 505*  ALT 217* 259*  ALKPHOS 241* 239*  BILITOT 1.9* 2.3*  PROT 9.8* 9.8*  ALBUMIN 2.0* 1.9*   CBC:  Recent Labs Lab 11/28/15 0449 11/29/15 0324 11/30/15 0528  WBC 2.7* 4.2 3.9*  NEUTROABS 1.4*  --  2.6  HGB 8.4* 8.9* 8.5*  HCT 24.7* 26.7* 25.1*  MCV 77.2* 77.4* 76.3*  PLT 213 208 189   Coagulation:  Recent Labs Lab 11/30/15 0528  LABPROT 16.6*  INR 1.32   Urinalysis:  Recent Labs Lab 11/27/15 0915  COLORURINE AMBER*  LABSPEC 1.025  PHURINE 6.5  GLUCOSEU NEGATIVE  HGBUR SMALL*  BILIRUBINUR SMALL*  KETONESUR NEGATIVE  PROTEINUR 100*  NITRITE NEGATIVE  LEUKOCYTESUR SMALL*   Micro Labs:  AFB: in process Blood Cx: NGTD x2d Fungal Cx: NGTD x2d   Studies/Results: Ct Abdomen W Contrast  11/30/2015  CLINICAL DATA:  35 year old female with fever, elevated LFTs. Evaluate for liver abscess and gallbladder wall thickening. EXAM: CT ABDOMEN WITH CONTRAST TECHNIQUE: Multidetector CT imaging of the abdomen was performed using the standard protocol following bolus administration of intravenous contrast. CONTRAST:  187m OMNIPAQUE IOHEXOL 350 MG/ML  SOLN COMPARISON:  Ultrasound dated 11/27/2015 and HIDA scan dated 11/28/2015 FINDINGS: There is mild diffuse ground-glass airspace opacities involving the lung bases. No intra-abdominal free air or free fluid. There is diffuse thickened appearance of the gallbladder wall as seen on the ultrasound. No calcified gallstone identified. There is enhancement of the gallbladder mucosa. The liver, pancreas, spleen, adrenal glands, kidneys appear unremarkable. There is a low attenuating lobulated mass with no significant enhancement at the splenic hilum extending into splenic parenchyma measuring approximately 1.8 x 3.8 cm. This mass appears to  have a somewhat enhancing wall and may represent an abscess or necrotic lymph node, or less likely a pseudocyst arising from the pancreas. There is a 2.0 x 1.6 cm lobulated retroperitoneal hypodense lesion to the left of the celiac axis similar to the lesion and the splenic hilum which may again represent a necrotic mass or lymph node. Correlation with history and clinical exam recommended. MRI may provide better evaluation. Multiple top-normal retroperitoneal and mesenteric lymph nodes noted. No dilated bowel loops identified. The visualized abdominal aorta appears unremarkable. The origins of the celiac axis, and SMA, and IMA appear patent. No portal venous gas identified. Small fat containing umbilical hernia. Small pocket of subcutaneous air in the left anterior abdominal wall likely related to recent injection. IMPRESSION: Diffuse thickening of the gallbladder wall as seen on the prior ultrasound. No calcified stone or significant pericholecystic fluid. Lobular hypodense mass at the splenic hilum as well as to the left of the celiac axis may represent necrotic lesion or the carotid lymph nodes. Abscesses are not excluded. Clinical correlation is recommended. MRI may provide further characterization. Electronically Signed   By: Anner Crete M.D.   On: 11/30/2015 00:34   Nm Hepatobiliary Including Gb  11/28/2015  CLINICAL DATA:  RIGHT upper quadrant pain. HIV and hepatitis 8. Gallbladder wall thickening on ultrasound. Normal common bile duct. Normal bilirubin. EXAM: NUCLEAR MEDICINE HEPATOBILIARY IMAGING TECHNIQUE: Sequential images of the abdomen were obtained out to 60 minutes following intravenous administration of radiopharmaceutical. RADIOPHARMACEUTICALS:  5.2 mCi Tc-88m Choletec IV COMPARISON:  None. FINDINGS: There is relatively rapid clearance of radiotracer from the blood pool and homogeneous uptake in the liver. There is poor clearance of counts from the liver parenchyma over the 2 hour imaging.  The gallbladder is evident by 60 minutes and continues to fill up to 120 minutes. Trace amount of bile activity is noted in the bowel. IMPRESSION: 1. Patent cystic duct. 2. Poor excretion of radiotracer suggest liver dysfunction / hepatitis. Findings conveyed toDr.  BJanett Billow2/01/2016  [id number] Electronically Signed   By: SSuzy BouchardM.D.   On: 11/28/2015 16:06   Medications: I have reviewed the patient's current medications. Scheduled Meds: . enoxaparin (LOVENOX) injection  40 mg Subcutaneous Q24H  . fluconazole  200 mg Oral Daily  . piperacillin-tazobactam (ZOSYN)  IV  3.375 g Intravenous Q8H  . sulfamethoxazole-trimethoprim  1 tablet Oral Daily   Continuous Infusions: none PRN Meds:.ibuprofen   Assessment/Plan: Principal Problem:   Fever in adult Active Problems:   AIDS (HCC)   Acalculous cholecystitis   Vaginal candidiasis   Gallbladder disease   LFT elevation   FUO (fever of unknown origin)   [facility]   [facility]atitis   Hasantu KRenschis a 35year old woman with history of HIV (not on treatment) who presents with fever and body aches who is found be febrile at 100.60F, lactic acid of 3.39, elevated LFTs, normotensive,  chest XR with no acute findings, and negative influenza A and B PCR.   #Fever in Immunocompromised Adult: Patient had fever to 102.103F overnight.  LFTs continue to trend up.  CT abdomen was obtained yesterday and showed thickened gallbladder without stones as well as lobular hypodense mass at splenic hilum and left of the celiac axis.  This may be better characterized by MRI per report.  Differential still includes viral vs bacterial vs fungal.  Given CD4 count we are considering disseminated MAC, CMV, invasive fungal disease.  Thickened gallbladder without stones on abdominal US and CT with elevated LFTs causes concern for acalculous cholecystitis.  HIDA scan revealed patent common bile duct and gallbladder filling, but showed  slow excretion of radiotracer which raises concern for liver dysfunction/hepatitis.  Patient is negative for Hep B and C. Disseminated MAC is also possible given elevated alk phos.  It is also possible that this is due to her underlying, untreated HIV.  Pulmonary and CNS disease unlikely given normal chest x ray and no complaints of headache/stiffneck/confusion respectively.  - MRI abdomen today - Continue zosyn for empiric GI coverage (day 3) - Continue ibuprofen 400 mg PO q6h prn fever - Follow up CMV - Follow up HIV ab and genotype - Follow up AFB - Follow up blood cultures- NGTD x 2 days  - Follow up blood fungal cultures- NGTD x2 days - Surgery has signed off - ID following  #Vaginal and Oropharyngeal Candidiasis: Patient complained of 3 days of white vaginal discharge on admission that was consistent with candidiasis on physical exam.  Yeast was also noted to tongue on admission.  Patient reports history of vaginal candidiasis last year. - Continue fluconazole 200 mg daily (day 3 of 7)  #HIV: Most recent CD4 60. Patient was diagnosed with HIV in 2007. Patient seen by Dr. Johnnye Sima from 2007-2011. Patient has not been taking HAART since 2011. Per chart review, patient was prescribed truvada 200-300 mg daily, reyataz 300 mg daily, and norvir 100 mg daily. Last HIV labs performed 03/01/10 showed HIV 1 RNA quant was 245, CD4 200. Per notes by Dr. Johnnye Sima, patient has HIV-2.  No laboratory studies are present in EMR to confirm. Will need to reestablish care with [facility]. - Continue bactrim for PCP prophylaxis - Follow up HIV RNA quant with reflex genotype - Follow up HIV Ab - ID following  #Microcytic Anemia: Hgb 8-9 on this admission. Last baseline was 10-11 in 2011. Likely result of chronic HIV.  - am CBC   #Hx of Latent TB: Treated for 9 months with INH when she came to [facility] in 2007. No concern for active TB at this time.  IVF: None Diet: Full liquid, advance as tolerated  Dispo:  Disposition is deferred at this time, awaiting improvement of current medical problems.   This is a Careers information officer Note.  The care of the patient was discussed with Dr. Duwaine Maxin and the assessment and plan formulated with their assistance.  Please see their attached note for official documentation of the daily encounter.   LOS: 3 days   Lovina Reach, Med Student 11/30/2015, 9:37 AM

## 2015-11-30 NOTE — Progress Notes (Signed)
Subjective: Allison Wilson was seen and examined this AM.  She is feeling somewhat better.    Objective: Vital signs in last 24 hours: Filed Vitals:   11/30/15 0000 11/30/15 0500 11/30/15 0547 11/30/15 1352  BP:   113/63 125/73  Pulse:   98 107  Temp: 100 F (37.8 C)  98.7 F (37.1 C) 98.4 F (36.9 C)  TempSrc: Oral  Oral   Resp:   18 16  Height:      Weight:  138 lb 7.2 oz (62.8 kg)    SpO2:   99% 100%   Weight change: 3 lb 1.4 oz (1.4 kg) No intake or output data in the 24 hours ending 11/30/15 1546 General: sitting up in chair in NAD HEENT: [place]/AT Cardiac: RRR, no rubs, murmurs or gallops Pulm: clear to auscultation bilaterally, moving normal volumes of air Abd: soft, nondistended, BS present; mild RUQ TTP  Ext: warm and well perfused, no pedal edema Neuro: alert and oriented X3, responding appropriately  Lab Results: Basic Metabolic Panel:  Recent Labs Lab 11/29/15 0324 11/30/15 0528  NA 131* 129*  K 5.0 3.6  CL 106 101  CO2 20* 20*  GLUCOSE 85 93  BUN <5* 6  CREATININE 0.70 0.74  CALCIUM 8.6* 8.6*   Liver Function Tests:  Recent Labs Lab 11/29/15 0324 11/30/15 0528  AST 393* 505*  ALT 217* 259*  ALKPHOS 241* 239*  BILITOT 1.9* 2.3*  PROT 9.8* 9.8*  ALBUMIN 2.0* 1.9*   CBC:  Recent Labs Lab 11/28/15 0449 11/29/15 0324 11/30/15 0528  WBC 2.7* 4.2 3.9*  NEUTROABS 1.4*  --  2.6  HGB 8.4* 8.9* 8.5*  HCT 24.7* 26.7* 25.1*  MCV 77.2* 77.4* 76.3*  PLT 213 208 189   Coagulation:  Recent Labs Lab 11/30/15 0528  LABPROT 16.6*  INR 1.32   Studies/Results: Ct Abdomen W Contrast  11/30/2015  CLINICAL DATA:  35 year old female with fever, elevated LFTs. Evaluate for liver abscess and gallbladder wall thickening. EXAM: CT ABDOMEN WITH CONTRAST TECHNIQUE: Multidetector CT imaging of the abdomen was performed using the standard protocol following bolus administration of intravenous contrast. CONTRAST:  OMNIPAQUE IOHEXOL 350 MG/ML SOLN  COMPARISON:  Ultrasound dated 11/27/2015 and HIDA scan dated 11/28/2015 FINDINGS: There is mild diffuse ground-glass airspace opacities involving the lung bases. No intra-abdominal free air or free fluid. There is diffuse thickened appearance of the gallbladder wall as seen on the ultrasound. No calcified gallstone identified. There is enhancement of the gallbladder mucosa. The liver, pancreas, spleen, adrenal glands, kidneys appear unremarkable. There is a low attenuating lobulated mass with no significant enhancement at the splenic hilum extending into splenic parenchyma measuring approximately 1.8 x 3.8 cm. This mass appears to have a somewhat enhancing wall and may represent an abscess or necrotic lymph node, or less likely a pseudocyst arising from the pancreas. There is a 2.0 x 1.6 cm lobulated retroperitoneal hypodense lesion to the left of the celiac axis similar to the lesion and the splenic hilum which may again represent a necrotic mass or lymph node. Correlation with history and clinical exam recommended. MRI may provide better evaluation. Multiple top-normal retroperitoneal and mesenteric lymph nodes noted. No dilated bowel loops identified. The visualized abdominal aorta appears unremarkable. The origins of the celiac axis, and SMA, and IMA appear patent. No portal venous gas identified. Small fat containing umbilical hernia. Small pocket of subcutaneous air in the left anterior abdominal wall likely related to recent injection. IMPRESSION: Diffuse thickening of the  gallbladder wall as seen on the prior ultrasound. No calcified stone or significant pericholecystic fluid. Lobular hypodense mass at the splenic hilum as well as to the left of the celiac axis may represent necrotic lesion or the carotid lymph nodes. Abscesses are not excluded. Clinical correlation is recommended. MRI may provide further characterization. Electronically Signed   By: Elgie Collard M.D.   On: 11/30/2015 00:34    Medications: I have reviewed the patient's current medications. Scheduled Meds: . enoxaparin (LOVENOX) injection  40 mg Subcutaneous Q24H  . fluconazole  200 mg Oral Daily  . piperacillin-tazobactam (ZOSYN)  IV  3.375 g Intravenous Q8H  . sulfamethoxazole-trimethoprim  1 tablet Oral Daily   Continuous Infusions: none PRN Meds:.ibuprofen   Abx day 4: Zosyn started 02/03 (day 4) Vancomycin 02-03 - 02/03   Micro: Blood cultures x2 NGTD Fungal culture NGTD AFB culture pending Urine culture - multiples species  Crypto Ag neg Hep B and C neg CMV PCR pending  Assessment/Plan: 35 year old woman with untreated HIV here with fever and myalgia of unclear etiology.  Fever in immunosuppressed adult: LFTs continue to climb and patient has nightly fevers.  Investigating hepatobiliary source given lab abnormalities.  CT revealed a lobular hypodense mass at the splenic hilum and abscess is a possibility.  Presentation could all be untreated HIV as well. - getting MRI to further characterize CT findings - continue zosyn  - follow-up routine blood cultures, AFB and fungal cultures and narrow trx as appropriate - vitals qshift and prn - ID following and recommendations appreciated  Vaginal and oral candidiasis: continue  Diflucan daily x 7 days (stop date 02/09).  HIV/AIDS: Previously followed by [facility]. Lost to follow-up and not on therapy for at least 6 years. CD4 is 60. Previous [facility] notes refer to HIV 2 however there are HIV 1 viral loads in EPIC. - ID consulted and recommendations appreciated - HIV1 quant pending; HIV ab pending to differentiate HIV1 or HIV2 (this will affect HAART therapy)  Acute on chronic anemia:  Previous baseline 11 in 2009-2011.  No blood loss noted.  Acute drop likely 2/2 anemia of chronic disease and/or acute illness. - AM CBC  Diet: advance as tolerated VTE ppx: [place] Lovenox Code: Full  Dispo: Disposition is deferred at this time, awaiting  improvement of current medical problems.  Anticipated discharge in approximately 1-2 day(s).   The patient does not have a current PCP and does not know need an [facility] hospital follow-up appointment after discharge.  The patient does not know have transportation limitations that hinder transportation to clinic appointments.  .Services Needed at time of discharge: Y = Yes, Blank = No PT:   OT:   RN:   Equipment:   Other:     LOS: 3 days   Yolanda Manges, DO 11/30/2015, 3:46 PM

## 2015-12-01 LAB — COMPREHENSIVE METABOLIC PANEL
ALBUMIN: 1.9 g/dL — AB (ref 3.5–5.0)
ALT: 278 U/L — ABNORMAL HIGH (ref 14–54)
AST: 578 U/L — AB (ref 15–41)
Alkaline Phosphatase: 256 U/L — ABNORMAL HIGH (ref 38–126)
Anion gap: 9 (ref 5–15)
BUN: 10 mg/dL (ref 6–20)
CHLORIDE: 99 mmol/L — AB (ref 101–111)
CO2: 19 mmol/L — ABNORMAL LOW (ref 22–32)
Calcium: 8.7 mg/dL — ABNORMAL LOW (ref 8.9–10.3)
Creatinine, Ser: 0.71 mg/dL (ref 0.44–1.00)
GFR calc Af Amer: >60 mL/min (ref >60)
GLUCOSE: 87 mg/dL (ref 65–99)
POTASSIUM: 3.7 mmol/L (ref 3.5–5.1)
Sodium: 127 mmol/L — ABNORMAL LOW (ref 135–145)
Total Bilirubin: 3.6 mg/dL — ABNORMAL HIGH (ref 0.3–1.2)
Total Protein: 9.9 g/dL — ABNORMAL HIGH (ref 6.5–8.1)

## 2015-12-01 LAB — CBC WITH DIFFERENTIAL/PLATELET
BASOS ABS: 0 10*3/uL (ref 0.0–0.1)
BASOS PCT: 0 %
EOS ABS: 0 10*3/uL (ref 0.0–0.7)
EOS PCT: 0 %
HCT: 24.8 % — ABNORMAL LOW (ref 36.0–46.0)
Hemoglobin: 8.5 g/dL — ABNORMAL LOW (ref 12.0–15.0)
Lymphocytes Relative: 28 %
Lymphs Abs: 0.8 10*3/uL (ref 0.7–4.0)
MCH: 25.6 pg — ABNORMAL LOW (ref 26.0–34.0)
MCHC: 34.3 g/dL (ref 30.0–36.0)
MCV: 74.7 fL — ABNORMAL LOW (ref 78.0–100.0)
MONO ABS: 0.2 10*3/uL (ref 0.1–1.0)
Monocytes Relative: 8 %
Neutro Abs: 2 10*3/uL (ref 1.7–7.7)
Neutrophils Relative %: 64 %
PLATELETS: 238 10*3/uL (ref 150–400)
RBC: 3.32 MIL/uL — ABNORMAL LOW (ref 3.87–5.11)
RDW: 14 % (ref 11.5–15.5)
WBC: 3.1 10*3/uL — ABNORMAL LOW (ref 4.0–10.5)

## 2015-12-01 LAB — PATHOLOGIST SMEAR REVIEW

## 2015-12-01 LAB — CMV DNA, QUANTITATIVE, PCR
CMV DNA QUANT: POSITIVE [IU]/mL
Log10 CMV Qn DNA Pl: UNDETERMINED log10 IU/mL

## 2015-12-01 MED ORDER — METHOCARBAMOL 750 MG PO TABS
1500.0000 mg | ORAL_TABLET | Freq: Two times a day (BID) | ORAL | Status: DC | PRN
  Administered 2015-12-01 – 2015-12-02 (×2): 1500 mg via ORAL
  Filled 2015-12-01 (×2): qty 2

## 2015-12-01 MED ORDER — DEXTROSE-NACL 5-0.9 % IV SOLN
INTRAVENOUS | Status: AC
  Administered 2015-12-01 – 2015-12-02 (×2): via INTRAVENOUS

## 2015-12-01 MED ORDER — ENOXAPARIN SODIUM 40 MG/0.4ML ~~LOC~~ SOLN: 40.0000 mg | SUBCUTANEOUS | Status: DC

## 2015-12-01 MED ORDER — ENSURE ENLIVE PO LIQD
237.0000 mL | Freq: Two times a day (BID) | ORAL | Status: DC
  Administered 2015-12-01 – 2015-12-03 (×4): 237 mL via ORAL

## 2015-12-01 MED ORDER — ENOXAPARIN SODIUM 40 MG/0.4ML ~~LOC~~ SOLN
40.0000 mg | SUBCUTANEOUS | Status: DC
  Administered 2015-12-02: 40 mg via SUBCUTANEOUS
  Filled 2015-12-01: qty 0.4

## 2015-12-01 NOTE — Progress Notes (Signed)
Subjective: Patient reports that she is not feeling well this morning.  She reports poor sleep, poor appetite, and sweatiness overnight.  She reports that her abdomen is sore from the shots she is being given and she continues to have pain across the RQU.  She also reports that she is having back spasms and worsening back pain.    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  She had not eaten any undercooked or raw meat recently.   Objective: Vital signs in last 24 hours: Filed Vitals:   11/30/15 2245 12/01/15 0200 12/01/15 0326 12/01/15 0611  BP: 117/73     Pulse: 101     Temp: 100.9 F (38.3 C) 101.8 F (38.8 C)  99.3 F (37.4 C)  TempSrc: Oral     Resp: 16     Height:      Weight:   61.1 kg (134 lb 11.2 oz)   SpO2: 100%      Weight change: -1.7 kg (-3 lb 12 oz)  Intake/Output Summary (Last 24 hours) at 12/01/15 1106 Last data filed at 12/01/15 0739  Gross per 24 hour  Intake    350 ml  Output      0 ml  Net    350 ml   BP 117/73 mmHg  Pulse 101  Temp(Src) 99.3 F (37.4 C) (Oral)  Resp 16  Ht _0  (1.626 m)  Wt 61.1 kg (134 lb 11.2 oz)  BMI 23.11 kg/m2  SpO2 100%  LMP 11/08/2015  General Appearance:   Young woman, lying in hospital bed, alert, cooperative, no distress, appears tired  Head:    Normocephalic, without obvious abnormality, atraumatic  Eyes:    PERRL, conjunctiva/corneas clear, EOM's intact,     Nose:   Nares normal, septum midline, mucosa normal, no drainage   Throat:   Lips, mucosa, and tongue normal; last upper molar on right side appears darkened possible cavity  Neck:   Supple, symmetrical, trachea midline, no adenopathy;    thyroid:  no enlargement/tenderness/nodules;  Back:     Minimally tender to palpation of right lower back, symmetric in appearance, no curvature, ROM normal, no CVA tenderness  Lungs:     Clear to auscultation bilaterally, respirations unlabored  Chest Wall:     No tenderness or deformity   Heart:    Regular rate and rhythm, S1 and S2 normal, no murmur, rub   or gallop  Abdomen:     Soft, non-tender, bowel sounds active all four quadrants,    no masses, no organomegaly  Extremities:   Extremities normal, atraumatic, no cyanosis or edema  Pulses:   2+ and symmetric all extremities  Skin:   multiple small verrucous lesions on right hand, old scars noted to left anterior tibia, multiple areas of small round hyperpigmentation to the soles of feet bilaterally  Lymph nodes:   Cervical, supraclavicular, and axillary nodes normal  Neurologic:   CNII-XII intact, normal strength, sensation and reflexes    throughout   Lab Results:  HIV Studies:  HIV RNA quant: 1,090,000 copies/ml HIV Genotype: anticipate next week HIV Ab: anticipate result 12/04/15 CMV DNA: anticipate result 01/31/16  Basic Metabolic Panel:  Recent Labs Lab 11/30/15 0528 12/01/15 0442  NA 129* 127*  K 3.6 3.7  CL 101 99*  CO2 20* 19*  GLUCOSE 93 87  BUN 6 10  CREATININE 0.74 0.71  CALCIUM 8.6* 8.7*   Liver Function  Tests:  Recent Labs Lab 11/30/15 0528 12/01/15 0442  AST 505* 578*  ALT 259* 278*  ALKPHOS 239* 256*  BILITOT 2.3* 3.6*  PROT 9.8* 9.9*  ALBUMIN 1.9* 1.9*   No results for input(s): LIPASE, AMYLASE in the last 168 hours. No results for input(s): AMMONIA in the last 168 hours. CBC:  Recent Labs Lab 11/30/15 0528 12/01/15 0442  WBC 3.9* 3.1*  NEUTROABS 2.6 2.0  HGB 8.5* 8.5*  HCT 25.1* 24.8*  MCV 76.3* 74.7*  PLT 189 238   Coagulation:  Recent Labs Lab 11/30/15 0528  LABPROT 16.6*  INR 1.32    Urinalysis:  Recent Labs Lab 11/27/15 0915  COLORURINE AMBER*  LABSPEC 1.025  PHURINE 6.5  GLUCOSEU NEGATIVE  HGBUR SMALL*  BILIRUBINUR SMALL*  KETONESUR NEGATIVE  PROTEINUR 100*  NITRITE NEGATIVE  LEUKOCYTESUR SMALL*    Micro Results: Recent Results (from the past 240 hour(s))  Urine culture     Status: None   Collection Time:  11/27/15  9:15 AM  Result Value Ref Range Status   Specimen Description URINE, CLEAN CATCH  Final   Special Requests NONE  Final   Culture MULTIPLE SPECIES PRESENT, SUGGEST RECOLLECTION  Final   Report Status 11/28/2015 FINAL  Final  Blood culture (routine x 2)     Status: None (Preliminary result)   Collection Time: 11/27/15 10:05 AM  Result Value Ref Range Status   Specimen Description BLOOD LEFT ANTECUBITAL  Final   Special Requests BOTTLES DRAWN AEROBIC AND ANAEROBIC 5CC  Final   Culture NO GROWTH 3 DAYS  Final   Report Status PENDING  Incomplete  Blood culture (routine x 2)     Status: None (Preliminary result)   Collection Time: 11/27/15 10:10 AM  Result Value Ref Range Status   Specimen Description BLOOD RIGHT ANTECUBITAL  Final   Special Requests   Final    BOTTLES DRAWN AEROBIC AND ANAEROBIC 10CC AER 5CC ANA   Culture NO GROWTH 3 DAYS  Final   Report Status PENDING  Incomplete  Fungus culture, blood     Status: None (Preliminary result)   Collection Time: 11/27/15  3:05 PM  Result Value Ref Range Status   Specimen Description BLOOD RIGHT ANTECUBITAL  Final   Special Requests BOTTLES DRAWN AEROBIC ONLY 5CC  Final   Culture NO GROWTH 3 DAYS  Final   Report Status PENDING  Incomplete   Studies/Results: Ct Abdomen W Contrast  11/30/2015  CLINICAL DATA:  35 year old female with fever, elevated LFTs. Evaluate for liver abscess and gallbladder wall thickening. EXAM: CT ABDOMEN WITH CONTRAST TECHNIQUE: Multidetector CT imaging of the abdomen was performed using the standard protocol following bolus administration of intravenous contrast. CONTRAST:  115m OMNIPAQUE IOHEXOL 350 MG/ML SOLN COMPARISON:  Ultrasound dated 11/27/2015 and HIDA scan dated 11/28/2015 FINDINGS: There is mild diffuse ground-glass airspace opacities involving the lung bases. No intra-abdominal free air or free fluid. There is diffuse thickened appearance of the gallbladder wall as seen on the ultrasound. No  calcified gallstone identified. There is enhancement of the gallbladder mucosa. The liver, pancreas, spleen, adrenal glands, kidneys appear unremarkable. There is a low attenuating lobulated mass with no significant enhancement at the splenic hilum extending into splenic parenchyma measuring approximately 1.8 x 3.8 cm. This mass appears to have a somewhat enhancing wall and may represent an abscess or necrotic lymph node, or less likely a pseudocyst arising from the pancreas. There is a 2.0 x 1.6 cm lobulated retroperitoneal hypodense lesion to the left  of the celiac axis similar to the lesion and the splenic hilum which may again represent a necrotic mass or lymph node. Correlation with history and clinical exam recommended. MRI may provide better evaluation. Multiple top-normal retroperitoneal and mesenteric lymph nodes noted. No dilated bowel loops identified. The visualized abdominal aorta appears unremarkable. The origins of the celiac axis, and SMA, and IMA appear patent. No portal venous gas identified. Small fat containing umbilical hernia. Small pocket of subcutaneous air in the left anterior abdominal wall likely related to recent injection. IMPRESSION: Diffuse thickening of the gallbladder wall as seen on the prior ultrasound. No calcified stone or significant pericholecystic fluid. Lobular hypodense mass at the splenic hilum as well as to the left of the celiac axis may represent necrotic lesion or the carotid lymph nodes. Abscesses are not excluded. Clinical correlation is recommended. MRI may provide further characterization. Electronically Signed   By: Anner Crete M.D.   On: 11/30/2015 00:34   Mr Abdomen W Wo Contrast  11/30/2015  CLINICAL DATA:  Splenic lesion seen on recent CT. HIV. Hepatitis A. EXAM: MRI ABDOMEN WITHOUT AND WITH CONTRAST TECHNIQUE: Multiplanar multisequence MR imaging of the abdomen was performed both before and after the administration of intravenous contrast. CONTRAST:  15  mL MultiHance COMPARISON:  CT on 11/29/2015 FINDINGS: Lower chest:  No acute findings. Hepatobiliary: No hepatic masses are identified. Diffuse gallbladder wall thickening is seen without dilatation, which is nonspecific and may be related to HIV or diffuse hepatocellular disease. No evidence of biliary ductal dilatation. Pancreas:  No definite intrapancreatic mass identified. Spleen: No definite intra splenic mass identified. No evidence of splenomegaly. Adrenals/Urinary Tract: No masses identified. No evidence of hydronephrosis. Stomach/Bowel: Visualized portions within the abdomen are unremarkable. Vascular/Lymphatic: Evaluation is somewhat limited by lack of intra-abdominal fat. Mild T2 hyperintense lymphadenopathy is seen within the porta hepatis. There is a cystic appearing lesion with peripheral contrast enhancement seen just superior to the body of the pancreas in the left upper quadrant which measures 2.0 x 2.7 cm on image 21/series 7 and image 42 of series 1,104. There is another complex cystic lesion with peripheral enhancement seen in the splenic hilum which is similar appearance and measures 2.7 x 3.4 cm on image 41/series 11,104. The multiplicity of sites favors lymphadenopathy, with cystic areas representing some internal necrosis Other:  None. Musculoskeletal:  No suspicious bone lesions identified. IMPRESSION: Cystic lesions with peripheral contrast enhancement in the splenic hilum and just superior to the pancreatic body, suspicious for lymphadenopathy with central necrosis. Neoplasm such as lymphoma or Kaposi sarcoma cannot be excluded. Abscesses are felt to be less likely but cannot definitely be excluded. Mild solid-appearing lymphadenopathy in the porta hepatis, which is also nonspecific and may be neoplastic or reactive in etiology. Diffuse gallbladder wall thickening without dilatation, which is nonspecific and may related to HIV or diffuse hepatocellular disease. No evidence of hepatic mass  or biliary ductal dilatation. Electronically Signed   By: Earle Gell M.D.   On: 11/30/2015 18:05   Medications: I have reviewed the patient's current medications. Scheduled Meds: . enoxaparin (LOVENOX) injection  40 mg Subcutaneous Q24H  . sulfamethoxazole-trimethoprim  1 tablet Oral Daily   Continuous Infusions: . dextrose 5 % and 0.9% NaCl 75 mL/hr at 12/01/15 1101   PRN Meds:.ibuprofen, methocarbamol Assessment/Plan: Principal Problem:   Fever in adult Active Problems:   AIDS (HCC)   Acalculous cholecystitis   Vaginal candidiasis   Gallbladder disease   LFT elevation   HIV 2 (  human immunodeficiency virus type 2) ([place])   Hepatitis   Anemia   Allison Wilson is a 35 year old woman with history of HIV (not on treatment) who presents with fever and body aches who is found be febrile at 100.19F, lactic acid of 3.39, elevated LFTs, normotensive, chest XR with no acute findings, and negative influenza A and B PCR.   #Fever and elevated transaminitis in Immunocompromised Adult: Patient had fever to 101.34F overnight. LFTs continue to trend up today. MRI abdomen concerning for lymphadenopathy with central necrosis-cannot rule out neoplasm- at the splenic hilum, mild solid appearing lymphadenopathy at the porta hepatis, (nonspecific and may be neoplastic or reactive in etiology), and  diffuse gallbladder wall thickening without dilatation (nonspecific and may related to HIV or diffuse hepatocellular disease). Differential of patient with fever, HIV and transaminitis includes chronic viral hepatitis, infiltrative liver disease, disseminated opportunistic infection, hepatic steatosis, drug induced liver injury, and AIDS cholangiopathy.  Infiltrative disease (kaposi sarcoma vs NHL vs opportunistic infections) are possible given lymphadenopathy with central necrosis seen on MRI abdomen. Disseminated opportunistic infections (MAC, histoplasmosis, candida) are possible given low CD4 count, anemia,  elevated LFTs, lymphadenopathy with central necrosis on MRI.  Fungal blood cultures and AFB pending.  Given elevated alk phos, RUQ abdominal pain , fever AIDS cholangiopathy is a concern, however it is is less likely as HIDA scan did not show biliary obstruction. Drug induced liver injury is possible as patient was started on fluconazole upon admission for a vaginal yeast infection, however it does not explain original fever and myalgia presentation. Other causes of drug induced liver injury are unlikely as patient is not taking antiretroviral medications. Hepatitis is unlikely as hep B and C were negative.  Hep E is possible, however it is uncommon in [place] and pt has not been exposed to undercooked meat or animals (pigs, boar, deer, mongoose, rabbit, camel, chicken, rats, ferret, bats).  Hepatic steatosis is unlikely as there was no mention of it on abdominal imaging.  It is also possible that this is due to her underlying, untreated HIV. Pulmonary and CNS disease unlikely given normal chest x ray and no complaints of headache/stiffneck/confusion respectively.   - Consulted IR re bone marrow biopsy to evaluate for neoplastic vs infection.  Consider starting MAC treatment post bone marrow biopsy tomorrow - Obtain EBV antibody panel - Obtain CMV antibody, IgG - Discontinue fluconazole given rising LFTs - Discontinue zosyn for empiric GI coverage  - Continue ibuprofen 400 mg PO q6h prn fever - Follow up CMV  - Follow up HIV ab and genotype - Follow up AFB - Follow up blood cultures- NGTD x 3 days  - Follow up blood fungal cultures- NGTD x 3 days - Surgery has signed off - ID following  #Vaginal and Oropharyngeal Candidiasis: Patient complained of 3 days of white vaginal discharge on admission that was consistent with candidiasis on physical exam. Yeast was also noted to tongue on admission. Patient reports history of vaginal candidiasis last year. - Discontinue fluconazole 200 mg daily  (day 3 of 7) in the setting of uptrending LFTs  #Back Pain: Patient complains of back pain with spasm.  Previously controlled with ibuprofen on admission, however patient complains of increasing pain today.  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  - Continue ibuprofen 400 mg q4h for pain - Avoid cyclobenzaprine as it may cause further hepatic injury - Start robaxin (methocarbamol) 1.5 g BID for 2-3  days  #HIV: Most recent CD4 60 and viral load 1,090,000 copies/ml.  Patient was diagnosed with HIV in 2007. Patient seen by Dr. Johnnye Sima from 2007-2011. Patient has not been taking HAART since 2011. Per chart review, patient was prescribed truvada 200-300 mg daily, reyataz 300 mg daily, and norvir 100 mg daily. Last HIV labs performed 03/01/10 showed HIV 1 RNA quant was 245, CD4 200. Per notes by Dr. Johnnye Sima, patient has HIV-2. No laboratory studies are present in EMR to confirm. Will need to reestablish care with [facility]. - Continue bactrim for PCP prophylaxis - Follow up genotype - Follow up HIV Ab - ID following  #Microcytic Anemia: Hgb 8-9 on this admission. Last baseline was 10-11 in 2011. Likely result of chronic HIV.  - am CBC   #Hx of Latent TB: Treated for 9 months with INH when she came to [facility] in 2007. No concern for active TB at this time.  IVF: D5-NS _0  ml/hr  Diet: Regular diet - NPO after midnight for am bone marrow biopsy - Dietary consulted   Dispo: Disposition is deferred at this time, awaiting improvement of current medical problems.  This is a Careers information officer Note.  The care of the patient was discussed with Dr. Charlott Rakes and the assessment and plan formulated with their assistance.  Please see their attached note for official documentation of the daily encounter.   LOS: 4 days   Lovina Reach, Med Student 12/01/2015, 11:06 AM

## 2015-12-01 NOTE — Progress Notes (Signed)
Subjective: Abdominal MRI yesterday notable for lesions in the splenic hilum and mild solid-appearing lymphadenopathy. Overnight, she had a temperature of 101.38F.   She was tired-appearing this morning and complained of not sleeping well. She denies any night sweats, fevers, weight loss, poor appetite in the months leading up to her admission and also denied any diarrhea or abdominal pain since yesterday. She does report not tolerating much oral intake yesterday.    Objective: Vital signs in last 24 hours: Filed Vitals:   11/30/15 2245 12/01/15 0200 12/01/15 0326 12/01/15 0611  BP: 117/73     Pulse: 101     Temp: 100.9 F (38.3 C) 101.8 F (38.8 C)  99.3 F (37.4 C)  TempSrc: Oral     Resp: 16     Height:      Weight:   134 lb 11.2 oz (61.1 kg)   SpO2: 100%      Weight change: -3 lb 12 oz (-1.7 kg)  Intake/Output Summary (Last 24 hours) at 12/01/15 0857 Last data filed at 12/01/15 0739  Gross per 24 hour  Intake    350 ml  Output      0 ml  Net    350 ml   General: thin, [place] African female, tired-appearing, resting in bed HEENT: normocephalic, atraumatic Cardiac: RRR, no rubs, murmurs or gallops Pulm: clear to auscultation bilaterally without wheezing or rhonchi Abd: soft, nondistended, BS present; no tenderness noted on physical exam today Ext: warm and well perfused, no pedal edema Neuro: responds to questions appropriately, moving all extremities spontaneously,   Lab Results: Basic Metabolic Panel:  Recent Labs Lab 11/30/15 0528 12/01/15 0442  NA 129* 127*  K 3.6 3.7  CL 101 99*  CO2 20* 19*  GLUCOSE 93 87  BUN 6 10  CREATININE 0.74 0.71  CALCIUM 8.6* 8.7*   Liver Function Tests:  Recent Labs Lab 11/30/15 0528 12/01/15 0442  AST 505* 578*  ALT 259* 278*  ALKPHOS 239* 256*  BILITOT 2.3* 3.6*  PROT 9.8* 9.9*  ALBUMIN 1.9* 1.9*   CBC:  Recent Labs Lab 11/30/15 0528 12/01/15 0442  WBC 3.9* 3.1*  NEUTROABS 2.6 2.0  HGB 8.5* 8.5*  HCT  25.1* 24.8*  MCV 76.3* 74.7*  PLT 189 238   Coagulation:  Recent Labs Lab 11/30/15 0528  LABPROT 16.6*  INR 1.32   Studies/Results: Ct Abdomen W Contrast  11/30/2015  CLINICAL DATA:  35 year old female with fever, elevated LFTs. Evaluate for liver abscess and gallbladder wall thickening. EXAM: CT ABDOMEN WITH CONTRAST TECHNIQUE: Multidetector CT imaging of the abdomen was performed using the standard protocol following bolus administration of intravenous contrast. CONTRAST:  155m OMNIPAQUE IOHEXOL 350 MG/ML SOLN COMPARISON:  Ultrasound dated 11/27/2015 and HIDA scan dated 11/28/2015 FINDINGS: There is mild diffuse ground-glass airspace opacities involving the lung bases. No intra-abdominal free air or free fluid. There is diffuse thickened appearance of the gallbladder wall as seen on the ultrasound. No calcified gallstone identified. There is enhancement of the gallbladder mucosa. The liver, pancreas, spleen, adrenal glands, kidneys appear unremarkable. There is a low attenuating lobulated mass with no significant enhancement at the splenic hilum extending into splenic parenchyma measuring approximately 1.8 x 3.8 cm. This mass appears to have a somewhat enhancing wall and may represent an abscess or necrotic lymph node, or less likely a pseudocyst arising from the pancreas. There is a 2.0 x 1.6 cm lobulated retroperitoneal hypodense lesion to the left of the celiac axis similar to the lesion  and the splenic hilum which may again represent a necrotic mass or lymph node. Correlation with history and clinical exam recommended. MRI may provide better evaluation. Multiple top-normal retroperitoneal and mesenteric lymph nodes noted. No dilated bowel loops identified. The visualized abdominal aorta appears unremarkable. The origins of the celiac axis, and SMA, and IMA appear patent. No portal venous gas identified. Small fat containing umbilical hernia. Small pocket of subcutaneous air in the left anterior  abdominal wall likely related to recent injection. IMPRESSION: Diffuse thickening of the gallbladder wall as seen on the prior ultrasound. No calcified stone or significant pericholecystic fluid. Lobular hypodense mass at the splenic hilum as well as to the left of the celiac axis may represent necrotic lesion or the carotid lymph nodes. Abscesses are not excluded. Clinical correlation is recommended. MRI may provide further characterization. Electronically Signed   By: Anner Crete M.D.   On: 11/30/2015 00:34   Mr Abdomen W Wo Contrast  11/30/2015  CLINICAL DATA:  Splenic lesion seen on recent CT. HIV. Hepatitis A. EXAM: MRI ABDOMEN WITHOUT AND WITH CONTRAST TECHNIQUE: Multiplanar multisequence MR imaging of the abdomen was performed both before and after the administration of intravenous contrast. CONTRAST:  15 mL MultiHance COMPARISON:  CT on 11/29/2015 FINDINGS: Lower chest:  No acute findings. Hepatobiliary: No hepatic masses are identified. Diffuse gallbladder wall thickening is seen without dilatation, which is nonspecific and may be related to HIV or diffuse hepatocellular disease. No evidence of biliary ductal dilatation. Pancreas:  No definite intrapancreatic mass identified. Spleen: No definite intra splenic mass identified. No evidence of splenomegaly. Adrenals/Urinary Tract: No masses identified. No evidence of hydronephrosis. Stomach/Bowel: Visualized portions within the abdomen are unremarkable. Vascular/Lymphatic: Evaluation is somewhat limited by lack of intra-abdominal fat. Mild T2 hyperintense lymphadenopathy is seen within the porta hepatis. There is a cystic appearing lesion with peripheral contrast enhancement seen just superior to the body of the pancreas in the left upper quadrant which measures 2.0 x 2.7 cm on image 21/series 7 and image 42 of series 1,104. There is another complex cystic lesion with peripheral enhancement seen in the splenic hilum which is similar appearance and  measures 2.7 x 3.4 cm on image 41/series 11,104. The multiplicity of sites favors lymphadenopathy, with cystic areas representing some internal necrosis Other:  None. Musculoskeletal:  No suspicious bone lesions identified. IMPRESSION: Cystic lesions with peripheral contrast enhancement in the splenic hilum and just superior to the pancreatic body, suspicious for lymphadenopathy with central necrosis. Neoplasm such as lymphoma or Kaposi sarcoma cannot be excluded. Abscesses are felt to be less likely but cannot definitely be excluded. Mild solid-appearing lymphadenopathy in the porta hepatis, which is also nonspecific and may be neoplastic or reactive in etiology. Diffuse gallbladder wall thickening without dilatation, which is nonspecific and may related to HIV or diffuse hepatocellular disease. No evidence of hepatic mass or biliary ductal dilatation. Electronically Signed   By: Earle Gell M.D.   On: 11/30/2015 18:05   Medications: I have reviewed the patient's current medications. Scheduled Meds: . enoxaparin (LOVENOX) injection  40 mg Subcutaneous Q24H  . sulfamethoxazole-trimethoprim  1 tablet Oral Daily   Continuous Infusions: none PRN Meds:.ibuprofen   Antibiotics: Vancomycin 2/3 Zosyn 2/3 through 2/7  Micro: Blood cultures 2/3 x2 NGTD Fungal culture 2/3 NGTD AFB culture 2/3 pending Urine culture - multiples species 2/3  Crypto Ag  2/3 negative HBV/HCV 2/3 negative CMV PCR pending HIV 2/3 viral load/genotype pending EBV antibody panel 2/7 pending CMV antibody panel 2/7  pending  Assessment/Plan: Ms. Hodgkin is a 35 year old woman with untreated HIV here with fever, myalgia, transaminitis of unclear etiology.   Fever, myalgia, transaminitis: LFTs are persistently elevated. Abdominal MRI with lesions noted in the splenic hilum and mild lymphadenopathy around the porta hepatis. Differential still includes infectious causes like disseminated mac infection, EBV, CMV along with  neoplasms, like Kaposi sarcoma or lymphoma. Diflucan was started on admission and may be contributory to her persistently elevated LFTs. -Consult interventional radiology for bone marrow biopsy -Discontinue Zosyn as concern for acute cholecystitis is low -Follow-up lab studies outlined above -Check CMV and EBV antibody studies -Continue ibuprofen 200 mg as needed for fever -Start Robaxin 1500 mg twice daily as needed for muscle spasms -Continue Bactrim for PCP prophylaxis -Consult clinical dietitian for nutrition recommendations given patient's poor oral intake and start D5 normal saline at 75 mL/hour 24 hours  Vaginal and oral candidiasis:Treated with Diflucan 200 mg 4 days.  HIV/AIDS: Previously followed by [facility]. Lost to follow-up and not on therapy for at least 6 years. CD4 is 60. Previous [facility] notes refer to HIV 2 however there are HIV 1 viral loads in [facility]. -Follow-up HIV studies noted above -ID consulted and recommendations appreciated  Acute on chronic anemia: Hemoglobin continues to trend 8-9 with prior baseline 11 2009 through 2011. No acute blood loss. -Check CBC with differential tomorrow  Diet: Regular  VTE ppx: [place] Lovenox  Code: Full  Dispo: Disposition is deferred at this time, awaiting improvement of current medical problems.  Anticipated discharge in approximately 1-2 day(s).   The patient does not have a current PCP and does not know need an [facility] hospital follow-up appointment after discharge.  The patient does not know have transportation limitations that hinder transportation to clinic appointments.  .Services Needed at time of discharge: Y = Yes, Blank = No PT:   OT:   RN:   Equipment:   Other:     LOS: 4 days   Riccardo Dubin, MD 12/01/2015, 8:57 AM

## 2015-12-01 NOTE — Consult Note (Signed)
Chief Complaint: Patient was seen in consultation today for bone marrow biopsy Chief Complaint  Patient presents with  . Generalized Body Aches  . Fever   at the request of Dr Linus Salmons  Referring Physician(s): Dr Linus Salmons  History of Present Illness: Allison Wilson is a 35 y.o. female   HIV No treatment for 5 yrs Admitted with lethargy and fever Myalgias 3-5 day hx fever Elevated liver tests 2/6 MRI:  IMPRESSION: Cystic lesions with peripheral contrast enhancement in the splenic hilum and just superior to the pancreatic body, suspicious for lymphadenopathy with central necrosis. Neoplasm such as lymphoma or Kaposi sarcoma cannot be excluded. Abscesses are felt to be less likely but cannot definitely be excluded.  Mild solid-appearing lymphadenopathy in the porta hepatis, which is also nonspecific and may be neoplastic or reactive in etiology.  Diffuse gallbladder wall thickening without dilatation, which is nonspecific and may related to HIV or diffuse hepatocellular disease. No evidence of hepatic mass or biliary ductal dilatation.  Request made for bone marrow biopsy per Dr Linus Salmons ? Lymphoma; Kaposi's Now scheduled for same 2/8 in Radiology   Past Medical History  Diagnosis Date  . HIV (human immunodeficiency virus infection) ([place])   . Hepatitis A     History reviewed. No pertinent past surgical history.  Allergies: Review of patient's allergies indicates no known allergies.  Medications: Prior to Admission medications   Medication Sig Start Date End Date Taking? Authorizing Provider  oxymetazoline (AFRIN) 0.05 % nasal spray Place 1 spray into both nostrils 2 (two) times daily.   Yes Historical Provider, MD     History reviewed. No pertinent family history.  Social History   Social History  . Marital Status: Single    Spouse Name: N/A  . Number of Children: N/A  . Years of Education: N/A   Social History Main Topics  . Smoking status: Never  Smoker   . Smokeless tobacco: None  . Alcohol Use: No  . Drug Use: No  . Sexual Activity: Not Asked   Other Topics Concern  . None   Social History Narrative     Review of Systems: A 12 point ROS discussed and pertinent positives are indicated in the HPI above.  All other systems are negative.  Review of Systems  Constitutional: Positive for fever, activity change, appetite change and fatigue.  HENT: Negative for sore throat.   Eyes: Negative for visual disturbance.  Respiratory: Negative for cough and shortness of breath.   Gastrointestinal: Positive for abdominal pain.  Neurological: Positive for weakness.  Psychiatric/Behavioral: Negative for behavioral problems and confusion.    Vital Signs: BP 117/73 mmHg  Pulse 101  Temp(Src) 99.3 F (37.4 C) (Oral)  Resp 16  Ht 5' 4"  (1.626 m)  Wt 134 lb 11.2 oz (61.1 kg)  BMI 23.11 kg/m2  SpO2 100%  LMP 11/08/2015  Physical Exam  Constitutional: She is oriented to person, place, and time.  Cardiovascular: Normal rate, regular rhythm and normal heart sounds.   Pulmonary/Chest: Effort normal and breath sounds normal. She has no wheezes.  Abdominal: Soft. Bowel sounds are normal. There is no tenderness.  Musculoskeletal: Normal range of motion.  Neurological: She is alert and oriented to person, place, and time.  Skin: Skin is warm and dry.  Psychiatric: She has a normal mood and affect. Her behavior is normal. Judgment and thought content normal.  Nursing note and vitals reviewed.   Mallampati Score:  MD Evaluation Airway: WNL Heart: WNL Abdomen: WNL Chest/  Lungs: WNL ASA  Classification: 3 Mallampati/Airway Score: One  Imaging: Dg Chest 2 View  11/27/2015  CLINICAL DATA:  Upper back and shoulder pain beginning while vacuuming 2 days ago. Initial encounter. No known injury. EXAM: CHEST  2 VIEW COMPARISON:  None. FINDINGS: The lungs are clear. Heart size is normal. No pneumothorax or pleural effusion. No focal bony  abnormality. IMPRESSION: No acute disease. Electronically Signed   By: Inge Rise M.D.   On: 11/27/2015 09:36   Ct Abdomen W Contrast  11/30/2015  CLINICAL DATA:  35 year old female with fever, elevated LFTs. Evaluate for liver abscess and gallbladder wall thickening. EXAM: CT ABDOMEN WITH CONTRAST TECHNIQUE: Multidetector CT imaging of the abdomen was performed using the standard protocol following bolus administration of intravenous contrast. CONTRAST:  179m OMNIPAQUE IOHEXOL 350 MG/ML SOLN COMPARISON:  Ultrasound dated 11/27/2015 and HIDA scan dated 11/28/2015 FINDINGS: There is mild diffuse ground-glass airspace opacities involving the lung bases. No intra-abdominal free air or free fluid. There is diffuse thickened appearance of the gallbladder wall as seen on the ultrasound. No calcified gallstone identified. There is enhancement of the gallbladder mucosa. The liver, pancreas, spleen, adrenal glands, kidneys appear unremarkable. There is a low attenuating lobulated mass with no significant enhancement at the splenic hilum extending into splenic parenchyma measuring approximately 1.8 x 3.8 cm. This mass appears to have a somewhat enhancing wall and may represent an abscess or necrotic lymph node, or less likely a pseudocyst arising from the pancreas. There is a 2.0 x 1.6 cm lobulated retroperitoneal hypodense lesion to the left of the celiac axis similar to the lesion and the splenic hilum which may again represent a necrotic mass or lymph node. Correlation with history and clinical exam recommended. MRI may provide better evaluation. Multiple top-normal retroperitoneal and mesenteric lymph nodes noted. No dilated bowel loops identified. The visualized abdominal aorta appears unremarkable. The origins of the celiac axis, and SMA, and IMA appear patent. No portal venous gas identified. Small fat containing umbilical hernia. Small pocket of subcutaneous air in the left anterior abdominal wall likely  related to recent injection. IMPRESSION: Diffuse thickening of the gallbladder wall as seen on the prior ultrasound. No calcified stone or significant pericholecystic fluid. Lobular hypodense mass at the splenic hilum as well as to the left of the celiac axis may represent necrotic lesion or the carotid lymph nodes. Abscesses are not excluded. Clinical correlation is recommended. MRI may provide further characterization. Electronically Signed   By: AAnner CreteM.D.   On: 11/30/2015 00:34   Mr Abdomen W Wo Contrast  11/30/2015  CLINICAL DATA:  Splenic lesion seen on recent CT. HIV. Hepatitis A. EXAM: MRI ABDOMEN WITHOUT AND WITH CONTRAST TECHNIQUE: Multiplanar multisequence MR imaging of the abdomen was performed both before and after the administration of intravenous contrast. CONTRAST:  15 mL MultiHance COMPARISON:  CT on 11/29/2015 FINDINGS: Lower chest:  No acute findings. Hepatobiliary: No hepatic masses are identified. Diffuse gallbladder wall thickening is seen without dilatation, which is nonspecific and may be related to HIV or diffuse hepatocellular disease. No evidence of biliary ductal dilatation. Pancreas:  No definite intrapancreatic mass identified. Spleen: No definite intra splenic mass identified. No evidence of splenomegaly. Adrenals/Urinary Tract: No masses identified. No evidence of hydronephrosis. Stomach/Bowel: Visualized portions within the abdomen are unremarkable. Vascular/Lymphatic: Evaluation is somewhat limited by lack of intra-abdominal fat. Mild T2 hyperintense lymphadenopathy is seen within the porta hepatis. There is a cystic appearing lesion with peripheral contrast enhancement seen just superior  to the body of the pancreas in the left upper quadrant which measures 2.0 x 2.7 cm on image 21/series 7 and image 42 of series 1,104. There is another complex cystic lesion with peripheral enhancement seen in the splenic hilum which is similar appearance and measures 2.7 x 3.4 cm on  image 41/series 11,104. The multiplicity of sites favors lymphadenopathy, with cystic areas representing some internal necrosis Other:  None. Musculoskeletal:  No suspicious bone lesions identified. IMPRESSION: Cystic lesions with peripheral contrast enhancement in the splenic hilum and just superior to the pancreatic body, suspicious for lymphadenopathy with central necrosis. Neoplasm such as lymphoma or Kaposi sarcoma cannot be excluded. Abscesses are felt to be less likely but cannot definitely be excluded. Mild solid-appearing lymphadenopathy in the porta hepatis, which is also nonspecific and may be neoplastic or reactive in etiology. Diffuse gallbladder wall thickening without dilatation, which is nonspecific and may related to HIV or diffuse hepatocellular disease. No evidence of hepatic mass or biliary ductal dilatation. Electronically Signed   By: Earle Gell M.D.   On: 11/30/2015 18:05   Nm Hepatobiliary Including Gb  11/28/2015  CLINICAL DATA:  RIGHT upper quadrant pain. HIV and hepatitis 8. Gallbladder wall thickening on ultrasound. Normal common bile duct. Normal bilirubin. EXAM: NUCLEAR MEDICINE HEPATOBILIARY IMAGING TECHNIQUE: Sequential images of the abdomen were obtained out to 60 minutes following intravenous administration of radiopharmaceutical. RADIOPHARMACEUTICALS:  5.2 mCi Tc-53m Choletec IV COMPARISON:  None. FINDINGS: There is relatively rapid clearance of radiotracer from the blood pool and homogeneous uptake in the liver. There is poor clearance of counts from the liver parenchyma over the 2 hour imaging. The gallbladder is evident by 60 minutes and continues to fill up to 120 minutes. Trace amount of bile activity is noted in the bowel. IMPRESSION: 1. Patent cystic duct. 2. Poor excretion of radiotracer suggest liver dysfunction / hepatitis. Findings conveyed toDr.  BJanett Billow2/01/2016  [id number] Electronically Signed   By: SSuzy BouchardM.D.   On: 11/28/2015 16:06   [place]domen  Limited Ruq  11/27/2015  CLINICAL DATA:  Elevated LFTs, abdominal pain for 2 days, HIV EXAM: [place]DOMEN LIMITED - RIGHT UPPER QUADRANT COMPARISON:  None. FINDINGS: Gallbladder: No gallstones are noted within gallbladder. There is thickening of gallbladder wall up to 5.5 mm. Edema noted gallbladder wall. There is positive sonographic Murphy sign. Findings are suspicious for early cholecystitis. Clinical correlation is necessary. Common bile duct: Diameter: 3 mm in diameter within normal limits. Liver: No focal lesion identified. Within normal limits in parenchymal echogenicity. IMPRESSION: No gallstones are noted within gallbladder. There is thickening of gallbladder wall up to 5.5 mm. Edema noted gallbladder wall. There is positive sonographic Murphy sign. Findings are suspicious for early cholecystitis. Clinical correlation is necessary. Normal CBD. Electronically Signed   By: LLahoma CrockerM.D.   On: 11/27/2015 13:08    Labs:  CBC:  Recent Labs  11/28/15 0449 11/29/15 0324 11/30/15 0528 12/01/15 0442  WBC 2.7* 4.2 3.9* 3.1*  HGB 8.4* 8.9* 8.5* 8.5*  HCT 24.7* 26.7* 25.1* 24.8*  PLT 213 208 189 238    COAGS:  Recent Labs  11/30/15 0528  INR 1.32    BMP:  Recent Labs  11/28/15 0449 11/29/15 0324 11/30/15 0528 12/01/15 0442  NA 131* 131* 129* 127*  K 3.3* 5.0 3.6 3.7  CL 103 106 101 99*  CO2 18* 20* 20* 19*  GLUCOSE 76 85 93 87  BUN <5* <5* 6 10  CALCIUM 8.4* 8.6*  8.6* 8.7*  CREATININE 0.66 0.70 0.74 0.71  GFRNONAA >60 >60 >60 >60  GFRAA >60 >60 >60 >60    LIVER FUNCTION TESTS:  Recent Labs  11/28/15 0449 11/29/15 0324 11/30/15 0528 12/01/15 0442  BILITOT 1.0 1.9* 2.3* 3.6*  AST 246* 393* 505* 578*  ALT 149* 217* 259* 278*  ALKPHOS 231* 241* 239* 256*  PROT 10.1* 9.8* 9.8* 9.9*  ALBUMIN 1.8* 2.0* 1.9* 1.9*    TUMOR MARKERS: No results for input(s): AFPTM, CEA, CA199, CHROMGRNA in the last 8760 hours.  Assessment and  Plan:  +HIV Fever Fatigue Myalgias Abnormal MRI---? Lymphoma; Kaposi's Scheduled for bone marrow bx 2/8 in Rad Risks and Benefits discussed with the patient including, but not limited to bleeding, infection, damage to adjacent structures or low yield requiring additional tests. All of the patient's questions were answered, patient is agreeable to proceed. Consent signed and in chart.   Thank you for this interesting consult.  I greatly enjoyed meeting Allison Wilson and look forward to participating in their care.  A copy of this report was sent to the requesting provider on this date.  Electronically Signed: Monia Sabal A 12/01/2015, 11:47 AM   I spent a total of 20 Minutes    in face to face in clinical consultation, greater than 50% of which was counseling/coordinating care for BM bx

## 2015-12-01 NOTE — Progress Notes (Signed)
Initial Nutrition Assessment  DOCUMENTATION CODES:   Not applicable  INTERVENTION:  Provide Ensure Enlive po BID, each supplement provides 350 kcal and 20 grams of protein.  Encourage adequate PO intake.   NUTRITION DIAGNOSIS:   Increased nutrient needs related to chronic illness as evidenced by estimated needs.  GOAL:   Patient will meet greater than or equal to 90% of their needs  MONITOR:   PO intake, Supplement acceptance, Weight trends, Labs, I & O's  REASON FOR ASSESSMENT:   Consult Assessment of nutrition requirement/status  ASSESSMENT:   35 year old woman with a history of HIV (not currently on treatment) presented to the [facility] ED complaining of pain all over and feeling feverish for 2 days (11/25/15).  Pt reports having a decreased appetite since admission. Meal completion has been </=50%. Pt reports eating well PTA with at least 3 meals a day with an Ensure once daily. She reported weight has been stable with usual body weight of ~135 lbs. RD to order Ensure to aid in caloric and protein needs. Pt was encouraged to eat her food at meals.   Nutrition-Focused physical exam completed. Findings are no fat depletion, moderate muscle depletion, and no edema.   Labs: Low sodium, chloride, CO2, calcium. High ALT, AST, alkaline phosphatase, and total bilirubin.   Diet Order:  Diet regular Room service appropriate?: Yes; Fluid consistency:: Thin Diet NPO time specified Except for: Sips with Meds  Skin:  Reviewed, no issues  Last BM:  2/5  Height:   Ht Readings from Last 1 Encounters:  11/27/15  (1.626 m)    Weight:   Wt Readings from Last 1 Encounters:  12/01/15 134 lb 11.2 oz (61.1 kg)    Ideal Body Weight:  54.5 kg  BMI:  Body mass index is 23.11 kg/(m^2).  Estimated Nutritional Needs:   Kcal:  1850-2050  Protein:  85-95 grams  Fluid:  1.8 - 2 L/day  EDUCATION NEEDS:   No education needs identified at this time  Roslyn Smiling, MS,  RD, LDN Pager # [phone] After hours/ weekend pager # [phone]

## 2015-12-02 ENCOUNTER — Inpatient Hospital Stay (HOSPITAL_COMMUNITY): Payer: BLUE CROSS/BLUE SHIELD

## 2015-12-02 LAB — CBC WITH DIFFERENTIAL/PLATELET
BASOS PCT: 0 %
Basophils Absolute: 0 10*3/uL (ref 0.0–0.1)
Eosinophils Absolute: 0 10*3/uL (ref 0.0–0.7)
Eosinophils Relative: 0 %
HEMATOCRIT: 24.7 % — AB (ref 36.0–46.0)
HEMOGLOBIN: 8.3 g/dL — AB (ref 12.0–15.0)
Lymphocytes Relative: 39 %
Lymphs Abs: 0.9 10*3/uL (ref 0.7–4.0)
MCH: 25.5 pg — ABNORMAL LOW (ref 26.0–34.0)
MCHC: 33.6 g/dL (ref 30.0–36.0)
MCV: 75.8 fL — ABNORMAL LOW (ref 78.0–100.0)
MONOS PCT: 13 %
Monocytes Absolute: 0.3 10*3/uL (ref 0.1–1.0)
NEUTROS ABS: 1.1 10*3/uL — AB (ref 1.7–7.7)
NEUTROS PCT: 48 %
Platelets: 233 10*3/uL (ref 150–400)
RBC: 3.26 MIL/uL — AB (ref 3.87–5.11)
RDW: 14.2 % (ref 11.5–15.5)
WBC: 2.4 10*3/uL — AB (ref 4.0–10.5)

## 2015-12-02 LAB — CULTURE, BLOOD (ROUTINE X 2)
CULTURE: NO GROWTH
Culture: NO GROWTH

## 2015-12-02 LAB — COMPREHENSIVE METABOLIC PANEL
ALBUMIN: 1.8 g/dL — AB (ref 3.5–5.0)
ALK PHOS: 360 U/L — AB (ref 38–126)
ALT: 240 U/L — AB (ref 14–54)
ANION GAP: 5 (ref 5–15)
AST: 487 U/L — AB (ref 15–41)
BILIRUBIN TOTAL: 3.7 mg/dL — AB (ref 0.3–1.2)
BUN: 7 mg/dL (ref 6–20)
CALCIUM: 8.9 mg/dL (ref 8.9–10.3)
CO2: 21 mmol/L — AB (ref 22–32)
CREATININE: 0.67 mg/dL (ref 0.44–1.00)
Chloride: 105 mmol/L (ref 101–111)
GFR calc Af Amer: >60 mL/min (ref >60)
GFR calc non Af Amer: >60 mL/min (ref >60)
GLUCOSE: 94 mg/dL (ref 65–99)
Potassium: 3.8 mmol/L (ref 3.5–5.1)
SODIUM: 131 mmol/L — AB (ref 135–145)
TOTAL PROTEIN: 10.1 g/dL — AB (ref 6.5–8.1)

## 2015-12-02 LAB — EPSTEIN-BARR VIRUS VCA ANTIBODY PANEL
EBV Early Antigen Ab, IgG: 12 U/mL — ABNORMAL HIGH (ref 0.0–8.9)
EBV NA IgG: 197 U/mL — ABNORMAL HIGH (ref 0.0–17.9)

## 2015-12-02 LAB — HIV 1/2 AB DIFFERENTIATION
HIV 1 AB: POSITIVE — AB
HIV 2 AB: NEGATIVE

## 2015-12-02 LAB — HIV ANTIBODY (ROUTINE TESTING W REFLEX)

## 2015-12-02 LAB — RPR: RPR Ser Ql: NONREACTIVE

## 2015-12-02 LAB — CMV ANTIBODY, IGG (EIA)

## 2015-12-02 LAB — BONE MARROW EXAM: BONE MARROW EXAM: 88

## 2015-12-02 MED ORDER — ETHAMBUTOL HCL 400 MG PO TABS
1000.0000 mg | ORAL_TABLET | Freq: Every day | ORAL | Status: DC
  Administered 2015-12-02 – 2015-12-03 (×2): 1000 mg via ORAL
  Filled 2015-12-02 (×2): qty 2

## 2015-12-02 MED ORDER — LIDOCAINE HCL (PF) 1 % IJ SOLN
INTRAMUSCULAR | Status: AC
  Filled 2015-12-02: qty 30

## 2015-12-02 MED ORDER — ETHAMBUTOL HCL 400 MG PO TABS
15.0000 mg/kg | ORAL_TABLET | Freq: Every day | ORAL | Status: DC
  Filled 2015-12-02: qty 1

## 2015-12-02 MED ORDER — IBUPROFEN 200 MG PO TABS
400.0000 mg | ORAL_TABLET | Freq: Four times a day (QID) | ORAL | Status: DC | PRN
  Administered 2015-12-02 – 2015-12-03 (×2): 400 mg via ORAL
  Filled 2015-12-02 (×2): qty 2

## 2015-12-02 MED ORDER — FENTANYL CITRATE (PF) 100 MCG/2ML IJ SOLN
INTRAMUSCULAR | Status: AC | PRN
  Administered 2015-12-02: 50 ug via INTRAVENOUS
  Administered 2015-12-02 (×2): 25 ug via INTRAVENOUS

## 2015-12-02 MED ORDER — AZITHROMYCIN 600 MG PO TABS
600.0000 mg | ORAL_TABLET | Freq: Two times a day (BID) | ORAL | Status: DC
  Administered 2015-12-02 – 2015-12-03 (×3): 600 mg via ORAL
  Filled 2015-12-02 (×4): qty 1

## 2015-12-02 MED ORDER — MIDAZOLAM HCL 2 MG/2ML IJ SOLN
INTRAMUSCULAR | Status: AC
  Filled 2015-12-02: qty 2

## 2015-12-02 MED ORDER — RIFABUTIN 150 MG PO CAPS
300.0000 mg | ORAL_CAPSULE | Freq: Every day | ORAL | Status: DC
  Administered 2015-12-02 – 2015-12-03 (×2): 300 mg via ORAL
  Filled 2015-12-02 (×2): qty 2

## 2015-12-02 MED ORDER — MIDAZOLAM HCL 2 MG/2ML IJ SOLN
INTRAMUSCULAR | Status: AC | PRN
Start: 2015-12-02 — End: 2015-12-02
  Administered 2015-12-02 (×2): 1 mg via INTRAVENOUS

## 2015-12-02 NOTE — Progress Notes (Signed)
Subjective: She is scheduled for CT guided bone marrow biopsy today. Temperature 99.3 overnight though she did receive ibuprofen. No complaints this morning.  CMV titer resulted less than 200 albeit positive. IgG antibody positive as well. EBV antibody studies pending.  Objective: Vital signs in last 24 hours: Filed Vitals:   12/02/15 0910 12/02/15 0915 12/02/15 0920 12/02/15 0929  BP: 131/80 131/77 110/68 115/87  Pulse: 92 95 93 89  Temp:      TempSrc:      Resp: _0 Height:      Weight:      SpO2: 100% 100% 10% 99%   Weight change:   Intake/Output Summary (Last 24 hours) at 12/02/15 0953 Last data filed at 12/02/15 0646  Gross per 24 hour  Intake 1771.25 ml  Output      0 ml  Net 1771.25 ml   General: thin, [place] African female, tired-appearing, resting in bed HEENT: normocephalic, atraumatic Cardiac: RRR, no rubs, murmurs or gallops Pulm: clear to auscultation bilaterally without wheezing or rhonchi Abd: soft, nondistended, BS present; no tenderness noted on physical exam today Ext: warm and well perfused, no pedal edema Neuro: responds to questions appropriately, moving all extremities spontaneously,   Lab Results: Basic Metabolic Panel:  Recent Labs Lab 12/01/15 0442 12/02/15 0620  NA 127* 131*  K 3.7 3.8  CL 99* 105  CO2 19* 21*  GLUCOSE 87 94  BUN 10 7  CREATININE 0.71 0.67  CALCIUM 8.7* 8.9   Liver Function Tests:  Recent Labs Lab 12/01/15 0442 12/02/15 0620  AST 578* 487*  ALT 278* 240*  ALKPHOS 256* 360*  BILITOT 3.6* 3.7*  PROT 9.9* 10.1*  ALBUMIN 1.9* 1.8*   CBC:  Recent Labs Lab 12/01/15 0442 12/02/15 0620  WBC 3.1* 2.4*  NEUTROABS 2.0 1.1*  HGB 8.5* 8.3*  HCT 24.8* 24.7*  MCV 74.7* 75.8*  PLT 238 233   Coagulation:  Recent Labs Lab 11/30/15 0528  LABPROT 16.6*  INR 1.32   Studies/Results: Mr Abdomen W Wo Contrast  11/30/2015  CLINICAL DATA:  Splenic lesion seen on recent CT. HIV. Hepatitis A. EXAM: MRI  ABDOMEN WITHOUT AND WITH CONTRAST TECHNIQUE: Multiplanar multisequence MR imaging of the abdomen was performed both before and after the administration of intravenous contrast. CONTRAST:  15 mL MultiHance COMPARISON:  CT on 11/29/2015 FINDINGS: Lower chest:  No acute findings. Hepatobiliary: No hepatic masses are identified. Diffuse gallbladder wall thickening is seen without dilatation, which is nonspecific and may be related to HIV or diffuse hepatocellular disease. No evidence of biliary ductal dilatation. Pancreas:  No definite intrapancreatic mass identified. Spleen: No definite intra splenic mass identified. No evidence of splenomegaly. Adrenals/Urinary Tract: No masses identified. No evidence of hydronephrosis. Stomach/Bowel: Visualized portions within the abdomen are unremarkable. Vascular/Lymphatic: Evaluation is somewhat limited by lack of intra-abdominal fat. Mild T2 hyperintense lymphadenopathy is seen within the porta hepatis. There is a cystic appearing lesion with peripheral contrast enhancement seen just superior to the body of the pancreas in the left upper quadrant which measures 2.0 x 2.7 cm on image 21/series 7 and image 42 of series 1,104. There is another complex cystic lesion with peripheral enhancement seen in the splenic hilum which is similar appearance and measures 2.7 x 3.4 cm on image 41/series 11,104. The multiplicity of sites favors lymphadenopathy, with cystic areas representing some internal necrosis Other:  None. Musculoskeletal:  No suspicious bone lesions identified. IMPRESSION: Cystic lesions with peripheral contrast enhancement in the  splenic hilum and just superior to the pancreatic body, suspicious for lymphadenopathy with central necrosis. Neoplasm such as lymphoma or Kaposi sarcoma cannot be excluded. Abscesses are felt to be less likely but cannot definitely be excluded. Mild solid-appearing lymphadenopathy in the porta hepatis, which is also nonspecific and may be  neoplastic or reactive in etiology. Diffuse gallbladder wall thickening without dilatation, which is nonspecific and may related to HIV or diffuse hepatocellular disease. No evidence of hepatic mass or biliary ductal dilatation. Electronically Signed   By: Earle Gell M.D.   On: 11/30/2015 18:05   Medications: I have reviewed the patient's current medications. Scheduled Meds: . enoxaparin (LOVENOX) injection  40 mg Subcutaneous Q24H  . feeding supplement (ENSURE ENLIVE)  237 mL Oral BID BM  . lidocaine (PF)      . midazolam      . sulfamethoxazole-trimethoprim  1 tablet Oral Daily   Continuous Infusions: . dextrose 5 % and 0.9% NaCl 75 mL/hr at 12/02/15 0325  none PRN Meds:.methocarbamol   Antibiotics: Vancomycin 2/3 Zosyn 2/3 through 2/7  Micro: Blood cultures 2/3 x2 NGTD Fungal culture 2/3 NGTD AFB culture 2/3 pending Urine culture - multiples species 2/3  Crypto Ag  2/3 negative HBV/HCV 2/3 negative CMV PCR 2/7 positive [<200 copies] HIV viral load 2/3 VL 1.09 million copies/mL HIV genotype 2/3 pending EBV antibody panel 2/7 pending CMV antibody panel 2/7 positive [>10]  Assessment/Plan: Allison Wilson is a 35 year old woman with untreated HIV here with fever, myalgia, transaminitis of unclear etiology.   Fever, myalgia, transaminitis: LFTs are persistently elevated though appear to have stabilized since yesterday. Abdominal MRI with lesions noted in the splenic hilum and mild lymphadenopathy around the porta hepatis. Differential still includes infectious causes like disseminated mac infection, EBV, along with neoplasms, like Kaposi sarcoma or lymphoma. Diflucan was started on admission and may be contributory to her persistently elevated LFTs. -Follow-up biopsy results -Follow-up lab studies outlined above -Discontinue ibuprofen 400 mg to see if she is still having fevers -Continue Robaxin 1500 mg twice daily as needed for muscle spasms -Continue Bactrim for PCP  prophylaxis -Resume regular diet with ensure between meals per clinical dietitian -Check LDH and uric acid tomorrow to assess for lymphoma -Start empiric treatment for MAC today with clarithromycin, ethambutol, rifabutin  Vaginal and oral candidiasis:Treated with Diflucan 200 mg 4 days.  HIV/AIDS: Previously followed by [facility]. Lost to follow-up and not on therapy for at least 6 years. CD4 is 60. Previous [facility] notes refer to HIV 2 however there are HIV 1 viral loads in [facility].  -Follow-up HIV studies noted above -ID consulted and recommendations appreciated  Acute on chronic anemia: Hemoglobin continues to trend 8-9 with prior baseline 11 during 2009-2011.  -Check CBC with differential tomorrow  Diet: Regular  VTE ppx: [facility] Lovenox  Code: Full  Dispo: Disposition is deferred at this time, awaiting improvement of current medical problems.  Anticipated discharge in approximately 1-2 day(s).   The patient does not have a current PCP and does not know need an [facility] hospital follow-up appointment after discharge.  The patient does not know have transportation limitations that hinder transportation to clinic appointments.  .Services Needed at time of discharge: Y = Yes, Blank = No PT:   OT:   RN:   Equipment:   Other:     LOS: 5 days   Riccardo Dubin, MD 12/02/2015, 9:53 AM

## 2015-12-02 NOTE — Procedures (Signed)
CT-guided LEFT iliac bone marrow aspiration and core biopsy No complication No blood loss. See complete dictation in [facility] PACS  

## 2015-12-02 NOTE — Progress Notes (Signed)
S/p CT guided iliac bone marrow aspiration and core biopsy. No on anticoagulation or antiplatelets PTA.  Deemed low risk for bleeding post-IR. No blood loss noted from procedural note.  Plan: -resume Lovenox  subQ q24h starting tonight at 1800  Allison Wilson D. Nairi Oswald, PharmD, BCPS Clinical Pharmacist Pager: [phone] 12/02/2015 4:00 PM

## 2015-12-02 NOTE — Progress Notes (Signed)
PT Cancellation Note  Patient Details Name: Allison Wilson MRN: [id number] DOB: 04-Apr-1981   Cancelled Treatment:    Reason Eval/Treat Not Completed: Patient at procedure or test/unavailable   Out of room for biopsy;   Will follow up later today as time allows;  Otherwise, will follow up for PT tomorrow;   Thank you,  Van Clines, PT  Acute Rehabilitation Services Pager [phone] Office [phone]     Van Clines [facility] 12/02/2015, 9:06 AM

## 2015-12-03 LAB — COMPREHENSIVE METABOLIC PANEL
ALK PHOS: 435 U/L — AB (ref 38–126)
ALT: 215 U/L — AB (ref 14–54)
AST: 477 U/L — AB (ref 15–41)
Albumin: 1.6 g/dL — ABNORMAL LOW (ref 3.5–5.0)
Anion gap: 7 (ref 5–15)
BILIRUBIN TOTAL: 3.9 mg/dL — AB (ref 0.3–1.2)
BUN: 7 mg/dL (ref 6–20)
CHLORIDE: 101 mmol/L (ref 101–111)
CO2: 19 mmol/L — ABNORMAL LOW (ref 22–32)
CREATININE: 0.7 mg/dL (ref 0.44–1.00)
Calcium: 8.7 mg/dL — ABNORMAL LOW (ref 8.9–10.3)
GFR calc Af Amer: >60 mL/min (ref >60)
Glucose, Bld: 120 mg/dL — ABNORMAL HIGH (ref 65–99)
Potassium: 3.4 mmol/L — ABNORMAL LOW (ref 3.5–5.1)
Sodium: 127 mmol/L — ABNORMAL LOW (ref 135–145)
Total Protein: 9.2 g/dL — ABNORMAL HIGH (ref 6.5–8.1)

## 2015-12-03 LAB — CBC
HCT: 23.6 % — ABNORMAL LOW (ref 36.0–46.0)
Hemoglobin: 8.1 g/dL — ABNORMAL LOW (ref 12.0–15.0)
MCH: 25.6 pg — ABNORMAL LOW (ref 26.0–34.0)
MCHC: 34.3 g/dL (ref 30.0–36.0)
MCV: 74.4 fL — AB (ref 78.0–100.0)
PLATELETS: 233 10*3/uL (ref 150–400)
RBC: 3.17 MIL/uL — ABNORMAL LOW (ref 3.87–5.11)
RDW: 14.2 % (ref 11.5–15.5)
WBC: 6.8 10*3/uL (ref 4.0–10.5)

## 2015-12-03 LAB — URIC ACID: Uric Acid, Serum: 3.1 mg/dL (ref 2.3–6.6)

## 2015-12-03 LAB — LACTATE DEHYDROGENASE: LDH: 575 U/L — AB (ref 98–192)

## 2015-12-03 MED ORDER — DEXTROSE-NACL 5-0.9 % IV SOLN
INTRAVENOUS | Status: AC
  Administered 2015-12-03: 06:00:00 via INTRAVENOUS

## 2015-12-03 MED ORDER — ETHAMBUTOL HCL 100 MG PO TABS: 1000.0000 mg | ORAL_TABLET | Freq: Every day | ORAL | Status: DC

## 2015-12-03 MED ORDER — ENSURE ENLIVE PO LIQD: 237.0000 mL | Freq: Two times a day (BID) | ORAL | Status: DC

## 2015-12-03 MED ORDER — OXYCODONE HCL 5 MG PO TABS
5.0000 mg | ORAL_TABLET | Freq: Four times a day (QID) | ORAL | Status: DC | PRN
  Administered 2015-12-03: 5 mg via ORAL
  Filled 2015-12-03: qty 1

## 2015-12-03 MED ORDER — RIFABUTIN 150 MG PO CAPS: 300.0000 mg | ORAL_CAPSULE | Freq: Every day | ORAL | Status: DC

## 2015-12-03 MED ORDER — EMTRICITABINE-TENOFOVIR DF 200-300 MG PO TABS: 1.0000 | ORAL_TABLET | Freq: Every day | ORAL | Status: DC

## 2015-12-03 MED ORDER — ETHAMBUTOL HCL 400 MG PO TABS: 1000.0000 mg | ORAL_TABLET | Freq: Every day | ORAL | Status: DC

## 2015-12-03 MED ORDER — POTASSIUM CHLORIDE CRYS ER 20 MEQ PO TBCR
40.0000 meq | EXTENDED_RELEASE_TABLET | Freq: Once | ORAL | Status: AC
Start: 2015-12-03 — End: 2015-12-03
  Administered 2015-12-03: 40 meq via ORAL
  Filled 2015-12-03: qty 2

## 2015-12-03 MED ORDER — DOLUTEGRAVIR SODIUM 50 MG PO TABS: 50.0000 mg | ORAL_TABLET | Freq: Every day | ORAL | Status: DC

## 2015-12-03 MED ORDER — IBUPROFEN 400 MG PO TABS: 400.0000 mg | ORAL_TABLET | Freq: Four times a day (QID) | ORAL | Status: DC | PRN

## 2015-12-03 MED ORDER — OXYCODONE HCL 5 MG PO TABS
5.0000 mg | ORAL_TABLET | ORAL | Status: AC
  Administered 2015-12-03: 5 mg via ORAL
  Filled 2015-12-03: qty 1

## 2015-12-03 MED ORDER — AZITHROMYCIN 600 MG PO TABS: 600.0000 mg | ORAL_TABLET | Freq: Two times a day (BID) | ORAL | Status: DC

## 2015-12-03 MED ORDER — ACETAMINOPHEN 500 MG PO TABS: 500.0000 mg | ORAL_TABLET | Freq: Three times a day (TID) | ORAL | Status: DC | PRN

## 2015-12-03 MED ORDER — SULFAMETHOXAZOLE-TRIMETHOPRIM 800-160 MG PO TABS: 1.0000 | ORAL_TABLET | Freq: Every day | ORAL | Status: DC

## 2015-12-03 MED FILL — RIFABUTIN 150 MG CAPSULE: 150 | 30 days supply | Qty: 60 | Fill #0

## 2015-12-03 MED FILL — ETHAMBUTOL HCL 400 MG TAB: 400 | 30 days supply | Qty: 75 | Fill #0

## 2015-12-03 MED FILL — TIVICAY 50 MG TABLET: 50 | 30 days supply | Qty: 30 | Fill #0

## 2015-12-03 MED FILL — TRUVADA 200-300 MG TABS: 200-300 | 30 days supply | Qty: 30 | Fill #0

## 2015-12-03 MED FILL — IBUPROFEN 400 MG TABLET: 400 | 8 days supply | Qty: 30 | Fill #0

## 2015-12-03 MED FILL — SULFAMETHOXAZOLE/TMP DS TAB: 800-160 | 30 days supply | Qty: 30 | Fill #0

## 2015-12-03 NOTE — Discharge Summary (Signed)
Name: Allison Wilson MRN: [id number] DOB: [birth year] 35 y.o. PCP: Campbell Riches, MD  Date of Admission: 11/27/2015  8:37 AM Date of Discharge: 12/03/2015 Attending Physician: Thayer Headings, MD  Discharge Diagnosis: Acute febrile illness likely secondary to disseminated MAC infection Vaginal and oropharyngeal candidiasis Poorly controlled HIV Microcytic anemia  Discharge Medications:   Medication List    TAKE these medications        acetaminophen 500 MG tablet  Commonly known as:  TYLENOL  Take 1-2 tablets (500-1,000 mg total) by mouth every 8 (eight) hours as needed for fever.     azithromycin 600 MG tablet  Commonly known as:  ZITHROMAX  Take 1 tablet (600 mg total) by mouth 2 (two) times daily.     dolutegravir 50 MG tablet  Commonly known as:  TIVICAY  Take 1 tablet (50 mg total) by mouth daily.     emtricitabine-tenofovir 200-300 MG tablet  Commonly known as:  TRUVADA  Take 1 tablet by mouth daily.     ethambutol 400 MG tablet  Commonly known as:  MYAMBUTOL  Take 2.5 tablets (1,000 mg total) by mouth daily.     feeding supplement (ENSURE ENLIVE) Liqd  Take 237 mLs by mouth 2 (two) times daily between meals.     oxymetazoline 0.05 % nasal spray  Commonly known as:  AFRIN  Place 1 spray into both nostrils 2 (two) times daily.     rifabutin 150 MG capsule  Commonly known as:  MYCOBUTIN  Take 2 capsules (300 mg total) by mouth daily.     sulfamethoxazole-trimethoprim 800-160 MG tablet  Commonly known as:  BACTRIM DS,SEPTRA DS  Take 1 tablet by mouth daily.        Disposition and follow-up:   Ms.Deneisha Terhaar was discharged from [facility] in Stable condition.  At the hospital follow up visit please address:  Adherence to medications noted above  Consideration if she has TB vs MAC based off biopsy results [see below]  Labs / imaging needed at time of follow-up: see below  Follow-up Appointments:   Pending at the time of  discharge.   Discharge Instructions:   Consultations:    Procedures Performed:  Dg Chest 2 View  11/27/2015  CLINICAL DATA:  Upper back and shoulder pain beginning while vacuuming 2 days ago. Initial encounter. No known injury. EXAM: CHEST  2 VIEW COMPARISON:  None. FINDINGS: The lungs are clear. Heart size is normal. No pneumothorax or pleural effusion. No focal bony abnormality. IMPRESSION: No acute disease. Electronically Signed   By: Inge Rise M.D.   On: 11/27/2015 09:36   Ct Abdomen W Contrast  11/30/2015  CLINICAL DATA:  35 year old female with fever, elevated LFTs. Evaluate for liver abscess and gallbladder wall thickening. EXAM: CT ABDOMEN WITH CONTRAST TECHNIQUE: Multidetector CT imaging of the abdomen was performed using the standard protocol following bolus administration of intravenous contrast. CONTRAST:  138m OMNIPAQUE IOHEXOL 350 MG/ML SOLN COMPARISON:  Ultrasound dated 11/27/2015 and HIDA scan dated 11/28/2015 FINDINGS: There is mild diffuse ground-glass airspace opacities involving the lung bases. No intra-abdominal free air or free fluid. There is diffuse thickened appearance of the gallbladder wall as seen on the ultrasound. No calcified gallstone identified. There is enhancement of the gallbladder mucosa. The liver, pancreas, spleen, adrenal glands, kidneys appear unremarkable. There is a low attenuating lobulated mass with no significant enhancement at the splenic hilum extending into splenic parenchyma measuring approximately 1.8 x 3.8 cm. This mass appears to  have a somewhat enhancing wall and may represent an abscess or necrotic lymph node, or less likely a pseudocyst arising from the pancreas. There is a 2.0 x 1.6 cm lobulated retroperitoneal hypodense lesion to the left of the celiac axis similar to the lesion and the splenic hilum which may again represent a necrotic mass or lymph node. Correlation with history and clinical exam recommended. MRI may provide better  evaluation. Multiple top-normal retroperitoneal and mesenteric lymph nodes noted. No dilated bowel loops identified. The visualized abdominal aorta appears unremarkable. The origins of the celiac axis, and SMA, and IMA appear patent. No portal venous gas identified. Small fat containing umbilical hernia. Small pocket of subcutaneous air in the left anterior abdominal wall likely related to recent injection. IMPRESSION: Diffuse thickening of the gallbladder wall as seen on the prior ultrasound. No calcified stone or significant pericholecystic fluid. Lobular hypodense mass at the splenic hilum as well as to the left of the celiac axis may represent necrotic lesion or the carotid lymph nodes. Abscesses are not excluded. Clinical correlation is recommended. MRI may provide further characterization. Electronically Signed   By: Anner Crete M.D.   On: 11/30/2015 00:34   Mr Abdomen W Wo Contrast  11/30/2015  CLINICAL DATA:  Splenic lesion seen on recent CT. HIV. Hepatitis A. EXAM: MRI ABDOMEN WITHOUT AND WITH CONTRAST TECHNIQUE: Multiplanar multisequence MR imaging of the abdomen was performed both before and after the administration of intravenous contrast. CONTRAST:  15 mL MultiHance COMPARISON:  CT on 11/29/2015 FINDINGS: Lower chest:  No acute findings. Hepatobiliary: No hepatic masses are identified. Diffuse gallbladder wall thickening is seen without dilatation, which is nonspecific and may be related to HIV or diffuse hepatocellular disease. No evidence of biliary ductal dilatation. Pancreas:  No definite intrapancreatic mass identified. Spleen: No definite intra splenic mass identified. No evidence of splenomegaly. Adrenals/Urinary Tract: No masses identified. No evidence of hydronephrosis. Stomach/Bowel: Visualized portions within the abdomen are unremarkable. Vascular/Lymphatic: Evaluation is somewhat limited by lack of intra-abdominal fat. Mild T2 hyperintense lymphadenopathy is seen within the porta  hepatis. There is a cystic appearing lesion with peripheral contrast enhancement seen just superior to the body of the pancreas in the left upper quadrant which measures 2.0 x 2.7 cm on image 21/series 7 and image 42 of series 1,104. There is another complex cystic lesion with peripheral enhancement seen in the splenic hilum which is similar appearance and measures 2.7 x 3.4 cm on image 41/series 11,104. The multiplicity of sites favors lymphadenopathy, with cystic areas representing some internal necrosis Other:  None. Musculoskeletal:  No suspicious bone lesions identified. IMPRESSION: Cystic lesions with peripheral contrast enhancement in the splenic hilum and just superior to the pancreatic body, suspicious for lymphadenopathy with central necrosis. Neoplasm such as lymphoma or Kaposi sarcoma cannot be excluded. Abscesses are felt to be less likely but cannot definitely be excluded. Mild solid-appearing lymphadenopathy in the porta hepatis, which is also nonspecific and may be neoplastic or reactive in etiology. Diffuse gallbladder wall thickening without dilatation, which is nonspecific and may related to HIV or diffuse hepatocellular disease. No evidence of hepatic mass or biliary ductal dilatation. Electronically Signed   By: Earle Gell M.D.   On: 11/30/2015 18:05   Nm Hepatobiliary Including Gb  11/28/2015  CLINICAL DATA:  RIGHT upper quadrant pain. HIV and hepatitis 8. Gallbladder wall thickening on ultrasound. Normal common bile duct. Normal bilirubin. EXAM: NUCLEAR MEDICINE HEPATOBILIARY IMAGING TECHNIQUE: Sequential images of the abdomen were obtained out to 60  minutes following intravenous administration of radiopharmaceutical. RADIOPHARMACEUTICALS:  5.2 mCi Tc-63m Choletec IV COMPARISON:  None. FINDINGS: There is relatively rapid clearance of radiotracer from the blood pool and homogeneous uptake in the liver. There is poor clearance of counts from the liver parenchyma over the 2 hour imaging. The  gallbladder is evident by 60 minutes and continues to fill up to 120 minutes. Trace amount of bile activity is noted in the bowel. IMPRESSION: 1. Patent cystic duct. 2. Poor excretion of radiotracer suggest liver dysfunction / hepatitis. Findings conveyed toDr.  BJanett Billow2/01/2016  [id number] Electronically Signed   By: SSuzy BouchardM.D.   On: 11/28/2015 16:06   Ct Biopsy  12/02/2015  CLINICAL DATA:  HIV.  Fever.  Possible lymphoma. EXAM: CT GUIDED DEEP ILIAC BONE ASPIRATION AND CORE BIOPSY TECHNIQUE: Patient was placed supine on the CT gantry and limited axial scans through the pelvis were obtained. Appropriate skin entry site was identified. Skin site was marked, prepped with Betadine, draped in usual sterile fashion, and infiltrated locally with 1% lidocaine. Intravenous Fentanyl and Versed were administered as conscious sedation during continuous monitoring of the patient's level of consciousness and physiological / cardiorespiratory status by the radiology RN, with a total moderate sedation time of twelve minutes. Under CT fluoroscopic guidance an 11-gauge Cook trocar bone needle was advanced into the left iliac bone just lateral to the sacroiliac joint. Once needle tip position was confirmed, coaxial core and aspiration samples were obtained. The final sample was obtained using the guiding needle itself, which was then removed. Post procedure scans show no hematoma or fracture. Patient tolerated procedure well. COMPLICATIONS: COMPLICATIONS none IMPRESSION: 1. Technically successful CT guided left iliac bone core and aspiration biopsy. Electronically Signed   By: DLucrezia EuropeM.D.   On: 12/02/2015 10:16   [place]domen Limited Ruq  11/27/2015  CLINICAL DATA:  Elevated LFTs, abdominal pain for 2 days, HIV EXAM: [place]DOM[place] LIMITED - RIGHT UPPER QUADRANT COMPARISON:  None. FINDINGS: Gallbladder: No gallstones are noted within gallbladder. There is thickening of gallbladder wall up to 5.5 mm. Edema noted  gallbladder wall. There is positive sonographic Murphy sign. Findings are suspicious for early cholecystitis. Clinical correlation is necessary. Common bile duct: Diameter: 3 mm in diameter within normal limits. Liver: No focal lesion identified. Within normal limits in parenchymal echogenicity. IMPRESSION: No gallstones are noted within gallbladder. There is thickening of gallbladder wall up to 5.5 mm. Edema noted gallbladder wall. There is positive sonographic Murphy sign. Findings are suspicious for early cholecystitis. Clinical correlation is necessary. Normal CBD. Electronically Signed   By: LLahoma CrockerM.D.   On: 11/27/2015 13:08   Admission HPI: Ms[place]is [place] 35[place]dx [place], previously managed by [facility], off trx since 2011). She is here with acute c/o fever (Tmax 101F) and myalgias x 2 days. Muscle ache is specifically B/L back, shoulders (notices it when she vacuums) and legs. She denies headache, photophobia, loss of vision, neck pain/stiffness, N/V, diarrhea, cough, sore throat or weakness. She also notes thick, white vaginal discharge and itching x 3 days that she feels is c/w yeast. She says this has been a recurrent problem since last year and she was on trx (she cannot remember the drug) for about three months. It resumed again a few days ago. She reports no know hx of GC/CT/HSV and denies genital ulcers. Last CD4 was 200 and viral load 245 (2011). She says  she did follow with Dr. Johnnye Sima but was never on HAART. There are pre-EPIC [facility] notes dating back to 2007. It looks like there were some issues with follow-up visit, but it looks like she was on Norvir, Reyataz, Truvada from Nov 2008 through May 2011, after which time she was lost to follow-up.   Hospital Course by problem list:  Acute febrile illness:  Possibly disseminated MAC though could exclude neoplastic process. Patient presented to [facility] with 2 days of fever and myalgias  and was found to have transaminitis with some right upper quadrant tenderness. RUQ abdominal US showed a thickened gallbladder without stones. Patient was started on vancomycin and piperacillin-tazobactam per code sepsis in the Emergency Department which was then narrowed to his pipracillin/tazobactam within 24 hours for possible acute cholecystitis  Surgery was consulted and they ordered a HIDA scan given her atypical presentation which showed patent common bile duct, thickened gallbladder wall without stones, and hypodense masses at the splenic hilum and celiac axis. Lesions were further characterized by abdominal CT with contrast and abdominal MRI [see findings above], and bone marrow biopsy was scheduled on 12/02/15 to further distinguish whether she had a neoplastic or infectious process since her lymphadenopathy was in accessible for biopsy. She was started on empiric therapy for disseminated MAC with azithromycin 600 mg by mouth twice daily, ethambutol 400 mg by mouth daily, and rifabutin 300 mg by mouth daily . Throughout her hospital stay, she continued to have recurrent fevers which defervesced with ibuprofen 400 mg. Pertinent studies are included below. Following discharge, her bone marrow biopsy resulted with several epithelioid granulomata, and AFB stain notable for few acid fast bacilli within the granulomata.  Fungal stain was negative. Without cultures, a definitive diagnosis of tuberculosis versus MAC could not be established and should be considered at her follow-up appointment.   Blood cultures 2/3 x2 NGTD Fungal culture 2/3 NGTD AFB culture 2/3 NGTD Urine culture - multiples species 2/3  Crypto Ag 2/3 negative HBV/HCV 2/3 negative CMV PCR 2/7 positive [<200 copies] HIV viral load 2/3 VL 1.09 million copies/mL HIV genotype 2/3 pending HIV antibody 2/4 Type I EBV antibody panel 2/7 capsid IgG positive/IgM negative, nucleic acid IgG positive, early antigen IgG positive [reactivated  infection] CMV antibody panel 2/7 positive [>10] RPR 2/7 negative  Vaginal and Oropharyngeal Candidiasis: Patient also complained of white clumpy vaginal discharge for 3 days prior to admission.  On exam, patient was found to have white discharge consistent with a yeast infection.  Patient was started on fluconazole 200 mg on 11/27/15.  Course was decreased after 3 days given persistently abnormal LFTs as medication may have been contributory.  Poorly controlled HIV:  Patient was diagnosed with HIV in 2007 while still living in [place].  Patient was on HAART (Truvada 200-300 mg daily, Reyataz 300 mg daily, and Norvir 100 mg daily) from 2007-2011 with Dr. Johnnye Sima at [facility].  Patient stopped taking HAART when she lost insurance in 2011.  CD4 count upon admission was 60.  Viral load was 1,090,000 copies/ml. Given low CD4 count, patient was started on sulfamethoxazole-trimethoprim 800-160 mg tablet PO daily on 11/28/15 for PCP prophylaxis. Please assess adherence on follow-up.   Microcytic Anemia:  Likely secondary to chronic HIV infection. Patient had hemoglobin of 8-9 during this admission.  Last known baseline was approximately 10-11 back in 2011.    Discharge Vitals:   BP 108/53 mmHg  Pulse 125  Temp(Src) 100.2 F (37.9 C) (Oral)  Resp 16  Ht  5' 4"  (1.626 m)  Wt 138 lb (62.596 kg)  BMI 23.68 kg/m2  SpO2 99%  LMP 11/08/2015  Discharge Labs:  Results for orders placed or performed during the hospital encounter of 11/27/15 (from the past 24 hour(s))  Lactate dehydrogenase     Status: Abnormal   Collection Time: 12/03/15  7:11 AM  Result Value Ref Range   LDH 575 (H) 98 - 192 U/L  Comprehensive metabolic panel     Status: Abnormal   Collection Time: 12/03/15  7:11 AM  Result Value Ref Range   Sodium 127 (L) 135 - 145 mmol/L   Potassium 3.4 (L) 3.5 - 5.1 mmol/L   Chloride 101 101 - 111 mmol/L   CO2 19 (L) 22 - 32 mmol/L   Glucose, Bld 120 (H) 65 - 99 mg/dL   BUN 7 6 - 20 mg/dL    Creatinine, Ser 0.70 0.44 - 1.00 mg/dL   Calcium 8.7 (L) 8.9 - 10.3 mg/dL   Total Protein 9.2 (H) 6.5 - 8.1 g/dL   Albumin 1.6 (L) 3.5 - 5.0 g/dL   AST 477 (H) 15 - 41 U/L   ALT 215 (H) 14 - 54 U/L   Alkaline Phosphatase 435 (H) 38 - 126 U/L   Total Bilirubin 3.9 (H) 0.3 - 1.2 mg/dL   GFR calc non Af Amer >60 >60 mL/min   GFR calc Af Amer >60 >60 mL/min   Anion gap 7 5 - 15  CBC     Status: Abnormal   Collection Time: 12/03/15  7:11 AM  Result Value Ref Range   WBC 6.8 4.0 - 10.5 K/uL   RBC 3.17 (L) 3.87 - 5.11 MIL/uL   Hemoglobin 8.1 (L) 12.0 - 15.0 g/dL   HCT 23.6 (L) 36.0 - 46.0 %   MCV 74.4 (L) 78.0 - 100.0 fL   MCH 25.6 (L) 26.0 - 34.0 pg   MCHC 34.3 30.0 - 36.0 g/dL   RDW 14.2 11.5 - 15.5 %   Platelets 233 150 - 400 K/uL    Signed: Riccardo Dubin, MD 12/06/2015, 3:39 PM    Services Ordered on Discharge: None Equipment Ordered on Discharge: None

## 2015-12-03 NOTE — Discharge Instructions (Signed)
Thank you for trusting [place] with your medical care!  You were hospitalized for fever and muscle pain and treated with antibiotics.   It is important you continue taking the following medications:  Start taking Tivicay 1 tablet daily Start taking Truvada 1 tablet daily Continue Bactrim 1 tablet Continue rifabutin 150 mg twice daily Continue ethambutol 400 mg 2-1/2 tablets daily Continue azithromycin  twice daily  Please pick them up from the [facility]: [address] [place], [place] [address]  We will call you about follow-up appointment with an infectious disease doctor.   If you have any questions, please call [phone]

## 2015-12-03 NOTE — Progress Notes (Signed)
Subjective: Overnight, she was febrile twice to 101.2 and 102.9 Fahrenheit. She was given ibuprofen 400 mg 2 along with OxyIR 5 mg for right upper quadrant abdominal pain.  Objective: Vital signs in last 24 hours: Filed Vitals:   12/03/15 0126 12/03/15 0500 12/03/15 0554 12/03/15 0700  BP: 128/82  130/74 108/53  Pulse: 95  133 125  Temp: 98.8 F (37.1 C)  102.9 F (39.4 C) 100.2 F (37.9 C)  TempSrc: Oral  Oral Oral  Resp: 16   16  Height:      Weight:  138 lb (62.596 kg)    SpO2: 99%  100% 99%   Weight change:   Intake/Output Summary (Last 24 hours) at 12/03/15 3532 Last data filed at 12/03/15 0700  Gross per 24 hour  Intake    360 ml  Output      0 ml  Net    360 ml   General: thin, [place] African female, tired-appearing, resting in bed HEENT: normocephalic, atraumatic Cardiac: tachycardic, no rubs, murmurs or gallops Pulm: clear to auscultation bilaterally without wheezing or rhonchi Abd: soft, nondistended, BS present; no tenderness noted on physical exam today Ext: warm and well perfused, no pedal edema Neuro: responds to questions appropriately, moving all extremities spontaneously,   Lab Results: Basic Metabolic Panel:  Recent Labs Lab 12/02/15 0620 12/03/15 0711  NA 131* 127*  K 3.8 3.4*  CL 105 101  CO2 21* 19*  GLUCOSE 94 120*  BUN 7 7  CREATININE 0.67 0.70  CALCIUM 8.9 8.7*   Liver Function Tests:  Recent Labs Lab 12/02/15 0620 12/03/15 0711  AST 487* 477*  ALT 240* 215*  ALKPHOS 360* 435*  BILITOT 3.7* 3.9*  PROT 10.1* 9.2*  ALBUMIN 1.8* 1.6*   CBC:  Recent Labs Lab 12/01/15 0442 12/02/15 0620 12/03/15 0711  WBC 3.1* 2.4* 6.8  NEUTROABS 2.0 1.1*  --   HGB 8.5* 8.3* 8.1*  HCT 24.8* 24.7* 23.6*  MCV 74.7* 75.8* 74.4*  PLT 238 233 233   Coagulation:  Recent Labs Lab 11/30/15 0528  LABPROT 16.6*  INR 1.32   Studies/Results: Ct Biopsy  12/02/2015  CLINICAL DATA:  HIV.  Fever.  Possible lymphoma. EXAM: CT GUIDED DEEP  ILIAC BONE ASPIRATION AND CORE BIOPSY TECHNIQUE: Patient was placed supine on the CT gantry and limited axial scans through the pelvis were obtained. Appropriate skin entry site was identified. Skin site was marked, prepped with Betadine, draped in usual sterile fashion, and infiltrated locally with 1% lidocaine. Intravenous Fentanyl and Versed were administered as conscious sedation during continuous monitoring of the patient's level of consciousness and physiological / cardiorespiratory status by the radiology RN, with a total moderate sedation time of twelve minutes. Under CT fluoroscopic guidance an 11-gauge Cook trocar bone needle was advanced into the left iliac bone just lateral to the sacroiliac joint. Once needle tip position was confirmed, coaxial core and aspiration samples were obtained. The final sample was obtained using the guiding needle itself, which was then removed. Post procedure scans show no hematoma or fracture. Patient tolerated procedure well. COMPLICATIONS: COMPLICATIONS none IMPRESSION: 1. Technically successful CT guided left iliac bone core and aspiration biopsy. Electronically Signed   By: Allison Wilson M.D.   On: 12/02/2015 10:16   Medications: I have reviewed the patient's current medications. Scheduled Meds: . azithromycin  600 mg Oral BID  . enoxaparin (LOVENOX) injection  40 mg Subcutaneous Q24H  . ethambutol  1,000 mg Oral Daily  . feeding supplement (  ENSURE ENLIVE)  237 mL Oral BID BM  . rifabutin  300 mg Oral Daily  . sulfamethoxazole-trimethoprim  1 tablet Oral Daily   Continuous Infusions: . dextrose 5 % and 0.9% NaCl 75 mL/hr at 12/03/15 4166  none PRN Meds:.ibuprofen, methocarbamol, oxyCODONE   LDH 575  Antibiotics: Vancomycin 2/3 Zosyn 2/3 through 2/7  Micro: Blood cultures 2/3 x2 NGTD Fungal culture 2/3 NGTD AFB culture 2/3 NGTD Urine culture - multiples species 2/3 Bone marrow biopsy & cultures 2/8 pending  Crypto Ag  2/3 negative HBV/HCV 2/3  negative CMV PCR 2/7 positive [<200 copies] HIV viral load 2/3 VL 1.09 million copies/mL HIV genotype 2/3 pending HIV antibody 2/4 Type I EBV antibody panel 2/7 capsid IgG positive/IgM negative, nucleic acid IgG positive, early antigen IgG positive CMV antibody panel 2/7 positive [>10] RPR 2/7 negative  Assessment/Plan: Allison Wilson is a 35 year old woman with untreated HIV 1 with persistent fever, myalgia, transaminitis of unclear etiology.   Fever, myalgia, transaminitis: LFTs are persistently elevated though now appear to be stable 2 days. Abdominal MRI with lesions noted in the splenic hilum and mild lymphadenopathy around the porta hepatis. Differential still includes infectious causes like disseminated mac infection, along with neoplasms, like Kaposi sarcoma or lymphoma. EBV antibody studies suggestive of reactivation but no acute infection. LDH elevated which would suggest neoplastic disease. -Follow-up biopsy results -Follow-up lab studies outlined above -Continue ibuprofen 400 mg as needed for fevers -Continue oxyIR 76m as needed for muscle pain -Continue Bactrim for PCP prophylaxis -Resume regular diet with Ensure between meals per clinical dietitian -Continue empiric treatment for MAC today with clarithromycin, ethambutol, rifabutin -Start HAART on discharge: Tivicay and Truvada  Vaginal and oral candidiasis:Treated with Diflucan 200 mg 4 days.  HIV/AIDS: Previously followed by [facility]. Lost to follow-up and not on therapy for at least 6 years. CD4 is 60. Previous [facility] notes refer to HIV 2 however there are HIV 1 viral loads in EPIC which has now been confirmed.  -Follow-up HIV studies noted above as outpatient -ID consulted and recommendations appreciated  Acute on chronic anemia: Hemoglobin continues to trend 8-9 with prior baseline 11 during 2009-2011.   Diet: Regular  VTE ppx: [place] Lovenox  Code: Full  Dispo: Disposition is deferred at this time, awaiting  improvement of current medical problems.  Anticipated discharge in approximately 0-1 day(s).   The patient does not have a current PCP and [facility].  The patient does not know have transportation limitations that hinder transportation to clinic appointments.  .Services Needed at time of discharge: Y = Yes, Blank = No PT:   OT:   RN:   Equipment:   Other:     LOS: 6 days   RRiccardo Dubin MD 12/03/2015, 9:29 AM

## 2015-12-03 NOTE — Progress Notes (Signed)
Pt. Got d/c orders and follow up appointment.IV was d/c,pt. Ready to go home with husband.

## 2015-12-03 NOTE — Evaluation (Signed)
Physical Therapy Evaluation & Discharge Patient Details Name: Allison Wilson MRN: [id number] DOB: 11-Apr-1981 Today's Date: 12/03/2015   History of Present Illness  [name] is a 35 year old woman with untreated HIV here with fever, myalgia, transaminitis of unclear etiology.  Clinical Impression  Patient presents close to functional baseline.  RN reports high HR without symptoms.  Educated pt on need for limiting trips on stairs at home to conserve energy for necessary ADL's and trips out of home.  Feel patient independent and safe for d/c with no follow up or DME needs.  Will sign off.    Follow Up Recommendations No PT follow up    Equipment Recommendations  None recommended by PT    Recommendations for Other Services       Precautions / Restrictions Precautions Precautions: None      Mobility  Bed Mobility               General bed mobility comments: up in chair  Transfers Overall transfer level: Independent Equipment used: None                Ambulation/Gait Ambulation/Gait assistance: Independent Ambulation Distance (Feet): 300 Feet Assistive device: None Gait Pattern/deviations: Step-through pattern;Wide base of support;Decreased stride length     General Gait Details: waddling gait  Stairs Stairs: Yes Stairs assistance: Modified independent (Device/Increase time) Stair Management: One rail Right;Alternating pattern;Two rails;Step to pattern;Forwards Number of Stairs: 10 General stair comments: self selected pattern sometimes alternating sometimes one at a time ascends with one rail, at times using two rails to descend  Wheelchair Mobility    Modified Rankin (Stroke Patients Only)       Balance Overall balance assessment: No apparent balance deficits (not formally assessed)                                           Pertinent Vitals/Pain Pain Assessment: Faces Faces Pain Scale: Hurts little more Pain Location: R  post hip from biopsy Pain Descriptors / Indicators: Sore Pain Intervention(s): Monitored during session    Home Living Family/patient expects to be discharged to:: Private residence Living Arrangements: Spouse/significant other Available Help at Discharge: Available 24 hours/day Type of Home: House Home Access: Level entry     Home Layout: Two level;Bed/bath upstairs Home Equipment: Shower seat      Prior Function Level of Independence: Independent               Hand Dominance        Extremity/Trunk Assessment   Upper Extremity Assessment: Overall WFL for tasks assessed           Lower Extremity Assessment: Overall WFL for tasks assessed         Communication   Communication: No difficulties  Cognition Arousal/Alertness: Awake/alert Behavior During Therapy: WFL for tasks assessed/performed Overall Cognitive Status: Within Functional Limits for tasks assessed                      General Comments      Exercises        Assessment/Plan    PT Assessment Patent does not need any further PT services  PT Diagnosis Generalized weakness   PT Problem List    PT Treatment Interventions     PT Goals (Current goals can be found in the Care Plan section) Acute Rehab PT Goals  PT Goal Formulation: All assessment and education complete, DC therapy    Frequency     Barriers to discharge        Co-evaluation               End of Session   Activity Tolerance: Patient tolerated treatment well Patient left: with call bell/phone within reach;with nursing/sitter in room           Time: 1105-1120 PT Time Calculation (min) (ACUTE ONLY): 15 min   Charges:   PT Evaluation $PT Eval Low Complexity: 1 Procedure     PT G CodesElray Mcgregor 2015/12/18, 11:28 AM Sheran Lawless, PT [phone] Dec 18, 2015

## 2015-12-04 LAB — FUNGUS CULTURE, BLOOD: Culture: NO GROWTH

## 2015-12-09 LAB — CHROMOSOME ANALYSIS, BONE MARROW

## 2015-12-10 ENCOUNTER — Ambulatory Visit (INDEPENDENT_AMBULATORY_CARE_PROVIDER_SITE_OTHER): Payer: BLUE CROSS/BLUE SHIELD | Admitting: Internal Medicine

## 2015-12-10 ENCOUNTER — Encounter: Payer: Self-pay | Admitting: Internal Medicine

## 2015-12-10 VITALS — BP 120/73 | HR 112 | Temp 98.1°F | Ht 64.0 in | Wt 130.0 lb

## 2015-12-10 DIAGNOSIS — B20 Human immunodeficiency virus [HIV] disease: Secondary | ICD-10-CM | POA: Diagnosis not present

## 2015-12-10 DIAGNOSIS — K089 Disorder of teeth and supporting structures, unspecified: Secondary | ICD-10-CM | POA: Diagnosis not present

## 2015-12-10 DIAGNOSIS — R509 Fever, unspecified: Secondary | ICD-10-CM

## 2015-12-10 DIAGNOSIS — K759 Inflammatory liver disease, unspecified: Secondary | ICD-10-CM | POA: Diagnosis not present

## 2015-12-11 NOTE — Assessment & Plan Note (Signed)
This has resolved on treatment for presumed disseminated MAC.

## 2015-12-11 NOTE — Assessment & Plan Note (Signed)
Holding on treatment for now on MAC therapy.  Instructed to start next Thursday  And follow up the week after that.

## 2015-12-11 NOTE — Assessment & Plan Note (Signed)
Improved during hospitalization.  Will recheck lfts next visit.  Asymptomatic otherwise.

## 2015-12-11 NOTE — Assessment & Plan Note (Signed)
She is seeing a Education officer, community.

## 2015-12-11 NOTE — Progress Notes (Signed)
   Subjective:    Patient ID: Allison Wilson, female    DOB: [birth year], 35 y.o.   MRN: [id number]  HPI Here for hospital follow up and reestablish care. Long history of HIV and off treatment since 2011 reportedly for lack of insurance, though has had it now for some time.  Previously on [place], norvir truvada.  Hospitalized for fever, wieght loss and bone marrow cw AFB.  AFB blood culture still pending.  Bone marrow studies not sent for culture.     Left the hospital one week ago and feels better, eating, no fever since she left.  Taking ethambutol, azithromycin and rifabutyn, along with OI prophylaxis with Bactrim.  Has Tivicay and Truvada filled as well.    Review of Systems  Constitutional: Positive for fatigue. Negative for fever, activity change and appetite change.  Eyes: Negative for visual disturbance.  Respiratory: Negative for shortness of breath.   Gastrointestinal: Negative for nausea and diarrhea.  Skin: Negative for rash.  Neurological: Negative for dizziness and headaches.       Objective:   Physical Exam  Constitutional: She appears well-developed and well-nourished. No distress.  HENT:  Mouth/Throat: No oropharyngeal exudate.  Eyes: No scleral icterus.  Cardiovascular: Regular rhythm and normal heart sounds.   No murmur heard. tachy  Pulmonary/Chest: Effort normal and breath sounds normal. No respiratory distress.  Lymphadenopathy:    She has no cervical adenopathy.  Skin: No rash noted.   Social History   Social History  . Marital Status: Single    Spouse Name: N/A  . Number of Children: N/A  . Years of Education: N/A   Occupational History  . Not on file.   Social History Main Topics  . Smoking status: Never Smoker   . Smokeless tobacco: Not on file  . Alcohol Use: No  . Drug Use: No  . Sexual Activity: Not on file   Other Topics Concern  . Not on file   Social History Narrative         Assessment & Plan:

## 2015-12-14 ENCOUNTER — Encounter (HOSPITAL_COMMUNITY): Payer: Self-pay

## 2015-12-15 ENCOUNTER — Telehealth: Payer: Self-pay

## 2015-12-15 NOTE — Telephone Encounter (Signed)
Patient left message on voice mail regarding needing information sent to [facility] @ [phone].  His wife job called yesterday

## 2015-12-16 ENCOUNTER — Telehealth: Payer: Self-pay | Admitting: *Deleted

## 2015-12-16 ENCOUNTER — Encounter (HOSPITAL_COMMUNITY): Payer: Self-pay | Admitting: Emergency Medicine

## 2015-12-16 NOTE — Telephone Encounter (Signed)
Patient husband called and advised the patient is very sick since starting the medication. She has been throwing up and not able to eat anything. He advised she is very weak and wants to know what to do. Advised no fever, diarrhea, rash or shortness of breath. Advised him will ask the doctor and give him a call back.

## 2015-12-17 LAB — HIV-1 RNA ULTRAQUANT REFLEX TO GENTYP+

## 2015-12-17 NOTE — Telephone Encounter (Signed)
She is on azithromycin, ethambutol and rifabutin.  Did this start when she started Tivicay and Truvada which she was starting yesterday? If she hasn't started that, she can hold off until she feels better.   I would have her stop the rifabutin and see how she does without it.  Also, I noticed (and missed last week) that she was prescribed azithromycin twice a day and it should be ONCE a day, 600 mg.  That also may be part of the problem.  Have her change that too.   Also, can give zofran 4 mg, #30  Also, her husband is now known new positive and needs to get it.  I got a message from his [facility] PCP to see if he can get in next Thursday with me at the same time, which is fine.  Will need his CD4 and viral load which was done at [facility].  I also see that my 9:45 pt is scheduled with me on the 2nd AND the 9th, which is likely an error so can cancel him (chauncy) on the 2nd and put in the husband.     Thanks!

## 2015-12-24 ENCOUNTER — Encounter: Payer: Self-pay | Admitting: Internal Medicine

## 2015-12-24 ENCOUNTER — Ambulatory Visit: Payer: BLUE CROSS/BLUE SHIELD | Admitting: *Deleted

## 2015-12-24 ENCOUNTER — Ambulatory Visit (INDEPENDENT_AMBULATORY_CARE_PROVIDER_SITE_OTHER): Payer: BLUE CROSS/BLUE SHIELD | Admitting: Internal Medicine

## 2015-12-24 VITALS — BP 107/69 | HR 115 | Temp 98.8°F | Ht 64.0 in | Wt 125.0 lb

## 2015-12-24 DIAGNOSIS — Z79899 Other long term (current) drug therapy: Secondary | ICD-10-CM

## 2015-12-24 DIAGNOSIS — A312 Disseminated mycobacterium avium-intracellulare complex (DMAC): Secondary | ICD-10-CM | POA: Diagnosis not present

## 2015-12-24 DIAGNOSIS — K759 Inflammatory liver disease, unspecified: Secondary | ICD-10-CM

## 2015-12-24 DIAGNOSIS — A319 Mycobacterial infection, unspecified: Secondary | ICD-10-CM

## 2015-12-24 DIAGNOSIS — Z124 Encounter for screening for malignant neoplasm of cervix: Secondary | ICD-10-CM | POA: Insufficient documentation

## 2015-12-24 DIAGNOSIS — B20 Human immunodeficiency virus [HIV] disease: Secondary | ICD-10-CM

## 2015-12-24 DIAGNOSIS — Z113 Encounter for screening for infections with a predominantly sexual mode of transmission: Secondary | ICD-10-CM

## 2015-12-24 DIAGNOSIS — Z8619 Personal history of other infectious and parasitic diseases: Secondary | ICD-10-CM | POA: Insufficient documentation

## 2015-12-24 DIAGNOSIS — F1910 Other psychoactive substance abuse, uncomplicated: Secondary | ICD-10-CM

## 2015-12-24 DIAGNOSIS — K819 Cholecystitis, unspecified: Secondary | ICD-10-CM

## 2015-12-24 LAB — CBC WITH DIFFERENTIAL/PLATELET
BASOS ABS: 0 10*3/uL (ref 0.0–0.1)
Basophils Relative: 1 % (ref 0–1)
EOS ABS: 0 10*3/uL (ref 0.0–0.7)
Eosinophils Relative: 0 % (ref 0–5)
HEMATOCRIT: 24.6 % — AB (ref 36.0–46.0)
Hemoglobin: 7.9 g/dL — ABNORMAL LOW (ref 12.0–15.0)
LYMPHS ABS: 0.5 10*3/uL — AB (ref 0.7–4.0)
LYMPHS PCT: 17 % (ref 12–46)
MCH: 24.2 pg — ABNORMAL LOW (ref 26.0–34.0)
MCHC: 32.1 g/dL (ref 30.0–36.0)
MCV: 75.2 fL — AB (ref 78.0–100.0)
MPV: 9.3 fL (ref 8.6–12.4)
Monocytes Absolute: 0.3 10*3/uL (ref 0.1–1.0)
Monocytes Relative: 10 % (ref 3–12)
NEUTROS PCT: 72 % (ref 43–77)
Neutro Abs: 2.3 10*3/uL (ref 1.7–7.7)
PLATELETS: 291 10*3/uL (ref 150–400)
RBC: 3.27 MIL/uL — AB (ref 3.87–5.11)
RDW: 16.1 % — ABNORMAL HIGH (ref 11.5–15.5)
WBC: 3.2 10*3/uL — ABNORMAL LOW (ref 4.0–10.5)

## 2015-12-24 MED ORDER — ETHAMBUTOL HCL 400 MG PO TABS: 1000.0000 mg | ORAL_TABLET | Freq: Every day | ORAL | Status: DC

## 2015-12-24 MED ORDER — ONDANSETRON HCL 4 MG PO TABS: 4.0000 mg | ORAL_TABLET | Freq: Three times a day (TID) | ORAL | Status: DC | PRN

## 2015-12-24 MED ORDER — AZITHROMYCIN 600 MG PO TABS: 600.0000 mg | ORAL_TABLET | Freq: Every day | ORAL | Status: DC

## 2015-12-24 MED ORDER — DOLUTEGRAVIR SODIUM 50 MG PO TABS: 50.0000 mg | ORAL_TABLET | Freq: Every day | ORAL | Status: DC

## 2015-12-24 MED ORDER — EMTRICITABINE-TENOFOVIR DF 200-300 MG PO TABS: 1.0000 | ORAL_TABLET | Freq: Every day | ORAL | Status: DC

## 2015-12-24 MED FILL — ONDANSETRON HCL 4 MG TABLET: 4 | 20 days supply | Qty: 30 | Fill #0

## 2015-12-24 NOTE — Progress Notes (Signed)
   Subjective:    Patient ID: Allison Wilson, female    DOB: 04-19-1981, 35 y.o.   MRN: [id number]  HPI Here for follow up of probable disseminated MAI and HIV.    Recently hospitalized with fever and found granulomas and AFB in bone marrow and started on MAI therapy with ethambutol, azithromycin and rifabutin.  Improved and feeling better and started after 2 weeks on Tivicay and Truvada.  Has had some n/v over the last week.  Some continued weight loss.  No rashes.  No diarrhea, no sob.    Review of Systems  Constitutional: Positive for activity change, appetite change, fatigue and unexpected weight change. Negative for fever.  HENT: Negative for trouble swallowing.   Gastrointestinal: Positive for nausea and vomiting. Negative for abdominal pain and diarrhea.  Skin: Negative for rash.  Neurological: Negative for dizziness and headaches.       Objective:   Physical Exam  Constitutional: She appears well-developed and well-nourished. No distress.  HENT:  Mouth/Throat: No oropharyngeal exudate.  Eyes: No scleral icterus.  Cardiovascular: Normal rate, regular rhythm and normal heart sounds.   No murmur heard. Pulmonary/Chest: Effort normal and breath sounds normal. No respiratory distress. She has no wheezes.  Lymphadenopathy:    She has no cervical adenopathy.  Skin: No rash noted.    Social History   Social History  . Marital Status: Single    Spouse Name: N/A  . Number of Children: N/A  . Years of Education: N/A   Occupational History  . Not on file.   Social History Main Topics  . Smoking status: Never Smoker   . Smokeless tobacco: Never Used  . Alcohol Use: No  . Drug Use: No  . Sexual Activity:    Partners: Male   Other Topics Concern  . Not on file   Social History Narrative        Assessment & Plan:

## 2015-12-24 NOTE — Assessment & Plan Note (Signed)
I will recheck her labs today.  Likely secondary to MAI.

## 2015-12-24 NOTE — BH Specialist Note (Signed)
Counselor met with Allison Wilson in the exam room per Dr. Linus Salmons request for observed depression symptoms.  Patient was oriented times four with sad affect but appropriate dress. Patient was alert and talkative.  Patient was soft spoken but did not demonstrate any hesitancy in sharing with counselor about personal issues.  Counselor educated patient about the counseling services and what is available to her.  Patient communicated that she was depressed and feels her whole world has been turned up side down.  Patient shared that she is tired of being physically sick and has no appetite.  Counselor provided support and encouragement for patient.  Counselor recommended that patient meet with counselor to further discuss and process what's going on in her life right now that was causing her to be depressed. Patient made an appointment with counselor.   Rolena Infante, MA [facility]

## 2015-12-24 NOTE — Assessment & Plan Note (Signed)
Now on medication about 2 weeks.  Will recheck viral load, CD4.

## 2015-12-24 NOTE — Assessment & Plan Note (Signed)
Most consistent with MAI and on threapy.  With n/v, I will have her stop rifabutin and also azithromycin was prescribed as twice a day but told to take it once a day.  I suspect this will resolve n/v.

## 2015-12-25 ENCOUNTER — Emergency Department (HOSPITAL_COMMUNITY): Payer: BLUE CROSS/BLUE SHIELD

## 2015-12-25 ENCOUNTER — Encounter: Payer: Self-pay | Admitting: *Deleted

## 2015-12-25 ENCOUNTER — Observation Stay (HOSPITAL_COMMUNITY)
Admission: EM | Admit: 2015-12-25 | Discharge: 2015-12-26 | Disposition: A | Discharge status: Home / Self Care | Payer: BLUE CROSS/BLUE SHIELD | Attending: Internal Medicine | Admitting: Internal Medicine

## 2015-12-25 ENCOUNTER — Encounter (HOSPITAL_COMMUNITY): Payer: Self-pay | Admitting: *Deleted

## 2015-12-25 DIAGNOSIS — Z79899 Other long term (current) drug therapy: Secondary | ICD-10-CM | POA: Insufficient documentation

## 2015-12-25 DIAGNOSIS — B20 Human immunodeficiency virus [HIV] disease: Secondary | ICD-10-CM | POA: Diagnosis present

## 2015-12-25 DIAGNOSIS — R109 Unspecified abdominal pain: Secondary | ICD-10-CM | POA: Diagnosis present

## 2015-12-25 DIAGNOSIS — N179 Acute kidney failure, unspecified: Principal | ICD-10-CM | POA: Diagnosis present

## 2015-12-25 DIAGNOSIS — Z8619 Personal history of other infectious and parasitic diseases: Secondary | ICD-10-CM | POA: Diagnosis present

## 2015-12-25 DIAGNOSIS — Z792 Long term (current) use of antibiotics: Secondary | ICD-10-CM | POA: Diagnosis not present

## 2015-12-25 DIAGNOSIS — Z3202 Encounter for pregnancy test, result negative: Secondary | ICD-10-CM | POA: Insufficient documentation

## 2015-12-25 DIAGNOSIS — R000 Tachycardia, unspecified: Secondary | ICD-10-CM | POA: Insufficient documentation

## 2015-12-25 DIAGNOSIS — D649 Anemia, unspecified: Secondary | ICD-10-CM | POA: Diagnosis present

## 2015-12-25 LAB — LIPID PANEL
Cholesterol: 196 mg/dL (ref 125–200)
HDL: 22 mg/dL — ABNORMAL LOW
LDL Cholesterol: 144 mg/dL — ABNORMAL HIGH
Total CHOL/HDL Ratio: 8.9 ratio — ABNORMAL HIGH
Triglycerides: 150 mg/dL — ABNORMAL HIGH
VLDL: 30 mg/dL

## 2015-12-25 LAB — URINE MICROSCOPIC-ADD ON: RBC / HPF: NONE SEEN RBC/hpf (ref 0–5)

## 2015-12-25 LAB — COMPREHENSIVE METABOLIC PANEL WITH GFR
ALT: 107 U/L — ABNORMAL HIGH (ref 14–54)
AST: 229 U/L — ABNORMAL HIGH (ref 15–41)
Albumin: 1.9 g/dL — ABNORMAL LOW (ref 3.5–5.0)
Alkaline Phosphatase: 753 U/L — ABNORMAL HIGH (ref 38–126)
Anion gap: 9 (ref 5–15)
BUN: 22 mg/dL — ABNORMAL HIGH (ref 6–20)
CO2: 22 mmol/L (ref 22–32)
Calcium: 9.8 mg/dL (ref 8.9–10.3)
Chloride: 97 mmol/L — ABNORMAL LOW (ref 101–111)
Creatinine, Ser: 1.94 mg/dL — ABNORMAL HIGH (ref 0.44–1.00)
GFR calc Af Amer: 38 mL/min — ABNORMAL LOW
GFR calc non Af Amer: 32 mL/min — ABNORMAL LOW
Glucose, Bld: 115 mg/dL — ABNORMAL HIGH (ref 65–99)
Potassium: 4.1 mmol/L (ref 3.5–5.1)
Sodium: 128 mmol/L — ABNORMAL LOW (ref 135–145)
Total Bilirubin: 1.5 mg/dL — ABNORMAL HIGH (ref 0.3–1.2)
Total Protein: 11.2 g/dL — ABNORMAL HIGH (ref 6.5–8.1)

## 2015-12-25 LAB — PREGNANCY, URINE: PREG TEST UR: NEGATIVE

## 2015-12-25 LAB — COMPLETE METABOLIC PANEL WITHOUT GFR
ALT: 105 U/L — ABNORMAL HIGH (ref 6–29)
AST: 234 U/L — ABNORMAL HIGH (ref 10–30)
Albumin: 2.5 g/dL — ABNORMAL LOW (ref 3.6–5.1)
Alkaline Phosphatase: 858 U/L — ABNORMAL HIGH (ref 33–115)
BUN: 22 mg/dL (ref 7–25)
CO2: 21 mmol/L (ref 20–31)
Calcium: 9.3 mg/dL (ref 8.6–10.2)
Chloride: 97 mmol/L — ABNORMAL LOW (ref 98–110)
Creat: 1.32 mg/dL — ABNORMAL HIGH (ref 0.50–1.10)
GFR, Est African American: 60 mL/min
GFR, Est Non African American: 52 mL/min — ABNORMAL LOW
Glucose, Bld: 93 mg/dL (ref 65–99)
Potassium: 4.2 mmol/L (ref 3.5–5.3)
Sodium: 126 mmol/L — ABNORMAL LOW (ref 135–146)
Total Bilirubin: 1.5 mg/dL — ABNORMAL HIGH (ref 0.2–1.2)
Total Protein: 10.9 g/dL — ABNORMAL HIGH (ref 6.1–8.1)

## 2015-12-25 LAB — CBC
HCT: 23.9 % — ABNORMAL LOW (ref 36.0–46.0)
Hemoglobin: 8 g/dL — ABNORMAL LOW (ref 12.0–15.0)
MCH: 24.2 pg — ABNORMAL LOW (ref 26.0–34.0)
MCHC: 33.5 g/dL (ref 30.0–36.0)
MCV: 72.2 fL — ABNORMAL LOW (ref 78.0–100.0)
Platelets: 298 K/uL (ref 150–400)
RBC: 3.31 MIL/uL — ABNORMAL LOW (ref 3.87–5.11)
RDW: 16 % — ABNORMAL HIGH (ref 11.5–15.5)
WBC: 3.3 K/uL — ABNORMAL LOW (ref 4.0–10.5)

## 2015-12-25 LAB — URINALYSIS, ROUTINE W REFLEX MICROSCOPIC
Glucose, UA: NEGATIVE mg/dL
HGB URINE DIPSTICK: NEGATIVE
Ketones, ur: NEGATIVE mg/dL
NITRITE: NEGATIVE
PROTEIN: 30 mg/dL — AB
Specific Gravity, Urine: 1.021 (ref 1.005–1.030)
pH: 5 (ref 5.0–8.0)

## 2015-12-25 LAB — SYPHILIS: RPR W/REFLEX TO RPR TITER AND TREPONEMAL ANTIBODIES, TRADITIONAL SCREENING AND DIAGNOSIS ALGORITHM

## 2015-12-25 LAB — HIV-1 RNA QUANT-NO REFLEX-BLD
HIV 1 RNA Quant: 8059361 {copies}/mL — ABNORMAL HIGH
HIV-1 RNA Quant, Log: 6.91 {Log_copies}/mL — ABNORMAL HIGH

## 2015-12-25 LAB — T-HELPER CELL (CD4) - (RCID CLINIC ONLY)
CD4 % Helper T Cell: 15 % — ABNORMAL LOW (ref 33–55)
CD4 T Cell Abs: 100 /uL — ABNORMAL LOW (ref 400–2700)

## 2015-12-25 LAB — LIPASE, BLOOD: Lipase: 37 U/L (ref 11–51)

## 2015-12-25 MED ORDER — ENOXAPARIN SODIUM 40 MG/0.4ML ~~LOC~~ SOLN
40.0000 mg | Freq: Every day | SUBCUTANEOUS | Status: DC
  Administered 2015-12-26: 40 mg via SUBCUTANEOUS
  Filled 2015-12-25: qty 0.4

## 2015-12-25 MED ORDER — SODIUM CHLORIDE 0.9% FLUSH: 3.0000 mL | INTRAVENOUS | Status: DC | PRN

## 2015-12-25 MED ORDER — SODIUM CHLORIDE 0.9 % IV BOLUS (SEPSIS)
1000.0000 mL | Freq: Once | INTRAVENOUS | Status: AC
  Administered 2015-12-25: 1000 mL via INTRAVENOUS

## 2015-12-25 NOTE — Progress Notes (Signed)
Attempted to call ED RN for report,RN is discharging a patient,will call back

## 2015-12-25 NOTE — ED Provider Notes (Signed)
CSN: [id number]     Arrival date & time 12/25/15  1551 History   First MD Initiated Contact with Patient 12/25/15 1924     Chief Complaint  Patient presents with  . Abdominal Pain     (Consider location/radiation/quality/duration/timing/severity/associated sxs/prior Treatment) HPI  35 year old female with a history of HIV/AIDS presents with epigastric abdominal pain. Has been having this pain for about 2 weeks. Significant worse last night and today. Currently it is much better and is a 1/10. Vomited multiple times throughout the night last night area no longer nauseated. Pain is constant. No back pain, chest pain. Had a subjective fever yesterday. Patient called her ID doctor and was told to come here due to uncontrolled pain. No diarrhea.  Past Medical History  Diagnosis Date  . HIV (human immunodeficiency virus infection) (HCC)   . Hepatitis A    History reviewed. No pertinent past surgical history. No family history on file. Social History  Substance Use Topics  . Smoking status: Never Smoker   . Smokeless tobacco: Never Used  . Alcohol Use: No   OB History    No data available     Review of Systems  Constitutional: Positive for fever (subjective).  Respiratory: Negative for cough and shortness of breath.   Cardiovascular: Negative for chest pain.  Gastrointestinal: Positive for nausea, vomiting and abdominal pain. Negative for diarrhea and blood in stool.  Genitourinary: Negative for dysuria.  Musculoskeletal: Negative for back pain.  All other systems reviewed and are negative.     Allergies  Review of patient's allergies indicates no known allergies.  Home Medications   Prior to Admission medications   Medication Sig Start Date End Date Taking? Authorizing Provider  azithromycin (ZITHROMAX) 600 MG tablet Take 1 tablet (600 mg total) by mouth daily. 12/24/15   Gardiner Barefoot, MD  dolutegravir (TIVICAY) 50 MG tablet Take 1 tablet (50 mg total) by mouth daily.  12/24/15   Gardiner Barefoot, MD  emtricitabine-tenofovir (TRUVADA) 200-300 MG tablet Take 1 tablet by mouth daily. 12/24/15   Gardiner Barefoot, MD  ethambutol (MYAMBUTOL) 400 MG tablet Take 2.5 tablets (1,000 mg total) by mouth daily. 12/24/15   Gardiner Barefoot, MD  feeding supplement, ENSURE ENLIVE, (ENSURE ENLIVE) LIQD Take 237 mLs by mouth 2 (two) times daily between meals. 12/03/15   Rushil Terrilee Croak, MD  ondansetron (ZOFRAN) 4 MG tablet Take 1 tablet (4 mg total) by mouth every 8 (eight) hours as needed for nausea or vomiting. 12/24/15   Gardiner Barefoot, MD  oxymetazoline (AFRIN) 0.05 % nasal spray Place 1 spray into both nostrils 2 (two) times daily.    Historical Provider, MD  sulfamethoxazole-trimethoprim (BACTRIM DS,SEPTRA DS) 800-160 MG tablet Take 1 tablet by mouth daily. 12/03/15   Rushil Terrilee Croak, MD   BP 118/79 mmHg  Pulse 109  Temp(Src) 98 F (36.7 C) (Oral)  Resp 16  Ht  (1.626 m)  Wt 125 lb (56.7 kg)  BMI 21.45 kg/m2  SpO2 100%  LMP 12/09/2015 Physical Exam  Constitutional: She is oriented to person, place, and time. She appears well-developed and well-nourished.  HENT:  Head: Normocephalic and atraumatic.  Right Ear: External ear normal.  Left Ear: External ear normal.  Nose: Nose normal.  Eyes: Right eye exhibits no discharge. Left eye exhibits no discharge.  Cardiovascular: Regular rhythm and normal heart sounds.  Tachycardia present.   HR ~ 100  Pulmonary/Chest: Effort normal and breath sounds normal.  Abdominal: Soft. There  is tenderness in the epigastric area.  Neurological: She is alert and oriented to person, place, and time.  Skin: Skin is warm and dry.  Nursing note and vitals reviewed.   ED Course  Procedures (including critical care time) Labs Review Labs Reviewed  COMPREHENSIVE METABOLIC PANEL - Abnormal; Notable for the following:    Sodium 128 (*)    Chloride 97 (*)    Glucose, Bld 115 (*)    BUN 22 (*)    Creatinine, Ser 1.94 (*)    Total Protein 11.2 (*)     Albumin 1.9 (*)    AST 229 (*)    ALT 107 (*)    Alkaline Phosphatase 753 (*)    Total Bilirubin 1.5 (*)    GFR calc non Af Amer 32 (*)    GFR calc Af Amer 38 (*)    All other components within normal limits  CBC - Abnormal; Notable for the following:    WBC 3.3 (*)    RBC 3.31 (*)    Hemoglobin 8.0 (*)    HCT 23.9 (*)    MCV 72.2 (*)    MCH 24.2 (*)    RDW 16.0 (*)    All other components within normal limits  URINALYSIS, ROUTINE W REFLEX MICROSCOPIC (NOT AT [facility]) - Abnormal; Notable for the following:    Color, Urine AMBER (*)    APPearance TURBID (*)    Bilirubin Urine SMALL (*)    Protein, ur 30 (*)    Leukocytes, UA SMALL (*)    All other components within normal limits  URINE MICROSCOPIC-ADD ON - Abnormal; Notable for the following:    Squamous Epithelial / LPF 6-30 (*)    Bacteria, UA MANY (*)    Casts GRANULAR CAST (*)    All other components within normal limits  LIPASE, BLOOD  PREGNANCY, URINE    Imaging Review [place] Abdomen Complete  12/25/2015  CLINICAL DATA:  Epigastric abdominal pain.  Hepatitis A  and  HIV. EXAM: ABDOMEN ULTRASOUND COMPLETE COMPARISON:  MRI of 11/30/2015. FINDINGS: Gallbladder: Contracted. Patient ate prior to exam. No stone, wall thickening. Sonographic Murphy's sign was not elicited. Common bile duct: Diameter: Normal, 4 mm. Liver: Hepatomegaly, 21.8 cm craniocaudal.  No focal liver lesion. IVC: No abnormality visualized. Pancreas: Visualized portion unremarkable. Spleen: Cystic lesion within the splenic hilum corresponds to MRI abnormality. Example 3.3 x 1.9 x 2.9 cm. Right Kidney: Length: 13.2 cm. Echogenicity within normal limits. No mass or hydronephrosis visualized. Left Kidney: Length: 12.8 cm. Echogenicity within normal limits. No mass or hydronephrosis visualized. Abdominal aorta: No aneurysm visualized. Other findings: Cystic adenopathy within the porta hepatis and adjacent to the pancreas. Example porta hepatis node which measures 2.3 x  1.6 x 2.8 cm. IMPRESSION: 1. Contracted gallbladder secondary to recent meal. No evidence of acute cholecystitis or biliary duct dilatation. 2. Cystic lesions within the splenic hilum and upper abdomen are likely lymph nodes and were detailed on the recent MRI. Electronically Signed   By: Jeronimo GreavesKyle  Talbot M.D.   On: 12/25/2015 21:20   I have personally reviewed and evaluated these images and lab results as part of my medical decision-making.   EKG Interpretation   Date/Time:  Friday December 25 2015 16:18:43 EST Ventricular Rate:  113 PR Interval:  170 QRS Duration: 86 QT Interval:  318 QTC Calculation: 436 R Axis:   65 Text Interpretation:  Sinus tachycardia Biatrial enlargement Abnormal ECG  No old tracing to compare Confirmed by Aveion Nguyen  MD, Sonnie Bias [phone]) on  12/25/2015 7:11:43 PM      MDM   Final diagnoses:  Acute kidney injury (HCC)    Patient's pain is minimal at this time. Given focal epigastric tenderness and abnormal LFTs, Abd ultrasound was ordered. Given AKI (Previous Cr 0.7, now 1.94), will admit to internal medicine teaching service for IV fluids. Likely pre-renal with recent vomiting.    Pricilla Loveless, MD 12/25/15 [phone]

## 2015-12-25 NOTE — ED Notes (Signed)
Attempted to call report

## 2015-12-25 NOTE — ED Notes (Signed)
Pt states she was sent from her Dr's office for "abnormal labs" RE epigastric pain and vomiting.

## 2015-12-25 NOTE — Addendum Note (Signed)
Addended by: Gardiner BarefootOMER, ROBERT W on: 12/25/2015 11:27 AM   Modules accepted: Orders

## 2015-12-26 DIAGNOSIS — A312 Disseminated mycobacterium avium-intracellulare complex (DMAC): Secondary | ICD-10-CM | POA: Diagnosis not present

## 2015-12-26 DIAGNOSIS — R740 Nonspecific elevation of levels of transaminase and lactic acid dehydrogenase [LDH]: Secondary | ICD-10-CM

## 2015-12-26 DIAGNOSIS — A310 Pulmonary mycobacterial infection: Secondary | ICD-10-CM | POA: Diagnosis not present

## 2015-12-26 DIAGNOSIS — N179 Acute kidney failure, unspecified: Secondary | ICD-10-CM | POA: Diagnosis not present

## 2015-12-26 DIAGNOSIS — D649 Anemia, unspecified: Secondary | ICD-10-CM | POA: Diagnosis not present

## 2015-12-26 DIAGNOSIS — B20 Human immunodeficiency virus [HIV] disease: Secondary | ICD-10-CM | POA: Diagnosis not present

## 2015-12-26 LAB — COMPREHENSIVE METABOLIC PANEL
ALBUMIN: 1.6 g/dL — AB (ref 3.5–5.0)
ALK PHOS: 633 U/L — AB (ref 38–126)
ALT: 88 U/L — AB (ref 14–54)
AST: 181 U/L — ABNORMAL HIGH (ref 15–41)
Anion gap: 6 (ref 5–15)
BUN: 20 mg/dL (ref 6–20)
CALCIUM: 8.6 mg/dL — AB (ref 8.9–10.3)
CHLORIDE: 105 mmol/L (ref 101–111)
CO2: 18 mmol/L — AB (ref 22–32)
CREATININE: 1.62 mg/dL — AB (ref 0.44–1.00)
GFR calc Af Amer: 47 mL/min — ABNORMAL LOW (ref >60)
GFR calc non Af Amer: 40 mL/min — ABNORMAL LOW (ref >60)
GLUCOSE: 85 mg/dL (ref 65–99)
Potassium: 4.3 mmol/L (ref 3.5–5.1)
SODIUM: 129 mmol/L — AB (ref 135–145)
Total Bilirubin: 1.4 mg/dL — ABNORMAL HIGH (ref 0.3–1.2)
Total Protein: 9.6 g/dL — ABNORMAL HIGH (ref 6.5–8.1)

## 2015-12-26 LAB — VITAMIN B12: Vitamin B-12: 810 pg/mL (ref 180–914)

## 2015-12-26 LAB — FERRITIN: Ferritin: 420 ng/mL — ABNORMAL HIGH (ref 11–307)

## 2015-12-26 LAB — IRON AND TIBC
IRON: 20 ug/dL — AB (ref 28–170)
SATURATION RATIOS: 10 % — AB (ref 10.4–31.8)
TIBC: 207 ug/dL — AB (ref 250–450)
UIBC: 187 ug/dL

## 2015-12-26 LAB — RETICULOCYTES
RBC.: 2.91 MIL/uL — ABNORMAL LOW (ref 3.87–5.11)
Retic Count, Absolute: 34.9 10*3/uL (ref 19.0–186.0)
Retic Ct Pct: 1.2 % (ref 0.4–3.1)

## 2015-12-26 LAB — FOLATE: FOLATE: 11.5 ng/mL (ref >5.9)

## 2015-12-26 MED ORDER — SODIUM CHLORIDE 0.9 % IV SOLN
INTRAVENOUS | Status: AC
  Administered 2015-12-26: 04:00:00 via INTRAVENOUS

## 2015-12-26 MED ORDER — SULFAMETHOXAZOLE-TRIMETHOPRIM 800-160 MG PO TABS
1.0000 | ORAL_TABLET | Freq: Every day | ORAL | Status: DC
  Administered 2015-12-26: 1 via ORAL
  Filled 2015-12-26: qty 1

## 2015-12-26 MED ORDER — ENSURE ENLIVE PO LIQD
237.0000 mL | Freq: Two times a day (BID) | ORAL | Status: DC
  Administered 2015-12-26: 237 mL via ORAL

## 2015-12-26 MED ORDER — AZITHROMYCIN 600 MG PO TABS
600.0000 mg | ORAL_TABLET | Freq: Every day | ORAL | Status: DC
  Administered 2015-12-26: 600 mg via ORAL
  Filled 2015-12-26: qty 1

## 2015-12-26 MED ORDER — ETHAMBUTOL HCL 400 MG PO TABS
1000.0000 mg | ORAL_TABLET | Freq: Every day | ORAL | Status: DC
  Administered 2015-12-26: 1000 mg via ORAL
  Filled 2015-12-26: qty 2

## 2015-12-26 MED ORDER — RANITIDINE HCL 150 MG PO TABS: 150.0000 mg | ORAL_TABLET | Freq: Two times a day (BID) | ORAL | Status: DC

## 2015-12-26 NOTE — H&P (Signed)
Date: 12/26/2015               Patient Name:  Allison Wilson MRN: [id number]  DOB: Jun 18, 1981 Age / Sex: 35 y.o., female   PCP: No primary care provider on file.         Medical Service: Internal Medicine Teaching Service         Attending Physician: Dr. Sid Falcon, MD    First Contact: Dr. Ignacia Marvel Pager: [phone]  Second Contact: Dr. Julious Oka Pager: [phone]       After Hours (After 5p/  First Contact Pager: [phone]  weekends / holidays): Second Contact Pager: [phone]   Chief Complaint: Abdominal pain with nausea/vomiting  History of Present Illness: Allison Wilson is a 35 year old female from [place] with a history of HIV/AIDS diagnosed in 2007, CD4 count 100 with VL 8 million presenting to [facility] ED tonight with abdominal pain. She reports she has had ongoing abdominal pain for 2 weeks. The pain became significantly worse last night. She reports her pain was in her epigastric region and sharp/stabbing in nature. Pain is worse with food. Associated with 5-10 episodes of non-bloody yellow emesis last night. Reports subjective fever. She denies any chills, diarrhea, chest pain or back pain. She reports that she started Tivicay and Truvada prior to the onset of her symptoms. She says she had similar reaction to the medications when starting them several weeks ago and was told to stop them. She had an office visit with Dr. Linus Salmons yesterday and was told to resume the medications and her symptoms developed soon afterwards. She reports no episodes of nausea or vomiting today. She was able to tolerate breakfast this morning and reports good PO intake. Pain has also resolved at this time.   Meds: Current Facility-Administered Medications  Medication Dose Route Frequency Provider Last Rate Last Dose  . 0.9 %  sodium chloride infusion   Intravenous Continuous Francesca Oman, DO      . enoxaparin (LOVENOX) injection 40 mg  40 mg Subcutaneous Daily Francesca Oman, DO      . sodium chloride  flush (NS) 0.9 % injection 3 mL  3 mL Intravenous PRN Francesca Oman, DO        Allergies: Allergies as of 12/25/2015  . (No Known Allergies)   Past Medical History  Diagnosis Date  . HIV (human immunodeficiency virus infection) ([place])   . Hepatitis A    History reviewed. No pertinent past surgical history. No family history on file. Social History   Social History  . Marital Status: Married    Spouse Name: N/A  . Number of Children: N/A  . Years of Education: N/A   Occupational History  . Not on file.   Social History Main Topics  . Smoking status: Never Smoker   . Smokeless tobacco: Never Used  . Alcohol Use: No  . Drug Use: No  . Sexual Activity:    Partners: Male   Other Topics Concern  . Not on file   Social History Narrative    Review of Systems: Pertinent items noted in HPI and remainder of comprehensive ROS otherwise negative.  Physical Exam: Blood pressure 130/84, pulse 98, temperature 98.8 F (37.1 C), temperature source Oral, resp. rate 20, height 5' 4" (1.626 m), weight 114 lb 10.2 oz (52 kg), last menstrual period 12/09/2015, SpO2 100 %. General: alert, well-developed, and cooperative to examination.  Head: normocephalic and atraumatic.  Eyes: vision grossly intact, pupils  equal, pupils round, pupils reactive to light, no injection and anicteric.  Mouth: pharynx pink and moist, no erythema, and no exudates.  Neck: supple, full ROM, no thyromegaly, no JVD, and no carotid bruits.  Lungs: normal respiratory effort, no accessory muscle use, normal breath sounds, no crackles, and no wheezes. Heart: tachycardic, regular rhythm, no murmur, no gallop, and no rub.  Abdomen: soft, non-tender, normal bowel sounds, no distention, no guarding, no rebound tenderness Msk: no joint swelling, no joint warmth, and no redness over joints.  Pulses: 2+ DP/PT pulses bilaterally Extremities: No cyanosis, clubbing, edema Neurologic: alert & oriented X3, cranial nerves  II-XII intact, no focal defects  Skin: turgor normal and no rashes.  Psych: normal mood and affect  Lab results: Basic Metabolic Panel:  Recent Labs  12/24/15 1056 12/25/15 1630  NA 126* 128*  K 4.2 4.1  CL 97* 97*  CO2 21 22  GLUCOSE 93 115*  BUN 22 22*  CREATININE 1.32* 1.94*  CALCIUM 9.3 9.8   Liver Function Tests:  Recent Labs  12/24/15 1056 12/25/15 1630  AST 234* 229*  ALT 105* 107*  ALKPHOS 858* 753*  BILITOT 1.5* 1.5*  PROT 10.9* 11.2*  ALBUMIN 2.5* 1.9*    Recent Labs  12/25/15 1630  LIPASE 37   CBC:  Recent Labs  12/24/15 1056 12/25/15 1630  WBC 3.2* 3.3*  NEUTROABS 2.3  --   HGB 7.9* 8.0*  HCT 24.6* 23.9*  MCV 75.2* 72.2*  PLT 291 298   Fasting Lipid Panel:  Recent Labs  12/24/15 1056  CHOL 196  HDL 22*  LDLCALC 144*  TRIG 150*  CHOLHDL 8.9*   Urinalysis:  Recent Labs  12/25/15 1930  COLORURINE AMBER*  LABSPEC 1.021  PHURINE 5.0  GLUCOSEU NEGATIVE  HGBUR NEGATIVE  BILIRUBINUR SMALL*  KETONESUR NEGATIVE  PROTEINUR 30*  NITRITE NEGATIVE  LEUKOCYTESUR SMALL*   Imaging results:  US Abdomen Complete  12/25/2015  CLINICAL DATA:  Epigastric abdominal pain.  Hepatitis A  and  HIV. EXAM: ABDOMEN ULTRASOUND COMPLETE COMPARISON:  MRI of 11/30/2015. FINDINGS: Gallbladder: Contracted. Patient ate prior to exam. No stone, wall thickening. Sonographic Murphy's sign was not elicited. Common bile duct: Diameter: Normal, 4 mm. Liver: Hepatomegaly, 21.8 cm craniocaudal.  No focal liver lesion. IVC: No abnormality visualized. Pancreas: Visualized portion unremarkable. Spleen: Cystic lesion within the splenic hilum corresponds to MRI abnormality. Example 3.3 x 1.9 x 2.9 cm. Right Kidney: Length: 13.2 cm. Echogenicity within normal limits. No mass or hydronephrosis visualized. Left Kidney: Length: 12.8 cm. Echogenicity within normal limits. No mass or hydronephrosis visualized. Abdominal aorta: No aneurysm visualized. Other findings: Cystic  adenopathy within the porta hepatis and adjacent to the pancreas. Example porta hepatis node which measures 2.3 x 1.6 x 2.8 cm. IMPRESSION: 1. Contracted gallbladder secondary to recent meal. No evidence of acute cholecystitis or biliary duct dilatation. 2. Cystic lesions within the splenic hilum and upper abdomen are likely lymph nodes and were detailed on the recent MRI. Electronically Signed   By: Abigail Miyamoto M.D.   On: 12/25/2015 21:20    Other results: EKG: sinus tachycardia.  Assessment & Plan by Problem:  AKI: Cr 1.94 on admission. Baseline appears to be around 0.7. Patient with 1 day history of nausea and vomiting. Likely pre-renal given recent vomiting. Abdominal US with no hydronephrosis present.  -CMP in am -NS 125 mL/hr -Orthostatic vitals   AIDS: Patient with history of AIDS diagnosed in 2007. She was lost to follow up and  off treatment since 2011. Recently hospitalized 2/3-2/9 with acute febrile illness and weight loss thought to be 2/2 MAC. She was started on empiric therapy for disseminated MAC with Azithromycin 600 mg bid, Ethambutol 1000 mg daily and Rifabutin 300 mg daily along with Bactrim for PCP prophylaxis. No recurrent fevers since hospitalization thought patient reports subjective fever last night. Afebrile in ED today. Rifabutin was stopped at ID visit yesterday and Azithromycin reduced to once daily. She was instructed to resume HAART therapy with Tivicay and Truvada on 2/23. It does not appear that she resumed her medications at that time and reports she started her first dose last night. She developed abdominal pain with nausea and vomiting ~1 hour after taking the Tivicay and Truvada. CD4 count 3/2 was 100 with VL >8 million.  -Continue Azithromycin 600 mg daily and Ethambutol 1000 mg daily -Continue Bactrim 800-160 mg daily -Consult ID in am  Abdominal pain with N/V: Patient with 2 week history of epigastric abdominal pain with acute worsening last night. Associated  with 5-10 episodes of vomiting last night. Abdominal pain and nausea and vomiting have resolved. Reports symptoms began last night after restarting her Tivicay and Truvada medications. LFTs elevated today. AST 229, ALT 107, Alk Phos 753 with T.Bili 1.5. AST, ALT, Bili trending down from 2/3-2/9 hospitalization. Alk phos elevated from previous. US done in the ED shows hepatomegaly, no gallstones or gallbladder wall thickening. Negative murhpy's sign. No evidence of acute cholecystitis present. Symptoms have resolved, will continue to monitor.   DVT PPx: Lovenox  Dispo: Disposition is deferred at this time, awaiting improvement of current medical problems. Anticipated discharge in approximately 1-2 day(s).   The patient does not have a current PCP (No primary care provider on file.) and does need an [facility] hospital follow-up appointment after discharge.  The patient does not have transportation limitations that hinder transportation to clinic appointments.  Signed: Maryellen Pile, MD 12/26/2015, 1:37 AM

## 2015-12-26 NOTE — Discharge Instructions (Addendum)
It is important to keep taking ethambutol, azthromycin, and bactrim to prevent new or worsening infections. Wait till your appointment with Infectious Disease clinic to discuss alternatives and options for your HIV medications.  Also recommend starting to take Ranitidine, an antacid, 150mg  TWICE DAILY for your stomach symptoms. If this upsets your stomach or does not benefit you discuss at your follow up visit.  We will also contact you for an appointment at the Internal Medicine Center downstairs for follow p of your kidney injury at this visit. It is most likely from dehydration and vomiting so you need to drink lots of fluids over the next few days to help recover.

## 2015-12-26 NOTE — Discharge Summary (Signed)
Name: Allison Wilson MRN: [id number] DOB: Jan 07, 1981 35 y.o. PCP: No primary care provider on file.  Date of Admission: 12/25/2015  7:10 PM Date of Discharge: 12/28/2015 Attending Physician: Dr. Gilles Chiquito  Discharge Diagnosis: Active Problems:   AIDS ([place])   Anemia   Disseminated mycobacteriosis ([place])   Acute kidney injury ([place])  Discharge Medications:   Medication List    STOP taking these medications        dolutegravir 50 MG tablet  Commonly known as:  TIVICAY     emtricitabine-tenofovir 200-300 MG tablet  Commonly known as:  TRUVADA      TAKE these medications        azithromycin 600 MG tablet  Commonly known as:  ZITHROMAX  Take 1 tablet (600 mg total) by mouth daily.     ethambutol 400 MG tablet  Commonly known as:  MYAMBUTOL  Take 2.5 tablets (1,000 mg total) by mouth daily.     feeding supplement (ENSURE ENLIVE) Liqd  Take 237 mLs by mouth 2 (two) times daily between meals.     ondansetron 4 MG tablet  Commonly known as:  ZOFRAN  Take 1 tablet (4 mg total) by mouth every 8 (eight) hours as needed for nausea or vomiting.     ranitidine 150 MG tablet  Commonly known as:  ZANTAC  Take 1 tablet (150 mg total) by mouth 2 (two) times daily.     sulfamethoxazole-trimethoprim 800-160 MG tablet  Commonly known as:  BACTRIM DS,SEPTRA DS  Take 1 tablet by mouth daily.        Disposition and follow-up:   Allison Wilson was discharged from [facility] in Good condition.  At the hospital follow up visit please address:  1.  HIV/AIDS: Patient noncompliant on therapy reportedly from GI upset with taking it. Her disease is not controlled. Needs follow up with ID clinic to discuss if alternate therapy could be better tolerated.  2. Acute kidney injury: Attributed to nausea and vomiting and improving with fluids. Should have blood chemistry rechecked for resolution after discharge.  3.  Labs / imaging needed at time of follow-up: Complete  metabolic panel   Follow-up Appointments: Follow-up Information    Follow up with Scharlene Gloss, MD. Schedule an appointment as soon as possible for a visit on 12/28/2015.   Specialty:  Infectious Diseases   Contact information:   [address] [address] [phone]       Discharge Instructions:   Consultations:    Procedures Performed:  Ct Abdomen W Contrast  11/30/2015  CLINICAL DATA:  35 year old female with fever, elevated LFTs. Evaluate for liver abscess and gallbladder wall thickening. EXAM: CT ABDOMEN WITH CONTRAST TECHNIQUE: Multidetector CT imaging of the abdomen was performed using the standard protocol following bolus administration of intravenous contrast. CONTRAST:  169m OMNIPAQUE IOHEXOL 350 MG/ML SOLN COMPARISON:  Ultrasound dated 11/27/2015 and HIDA scan dated 11/28/2015 FINDINGS: There is mild diffuse ground-glass airspace opacities involving the lung bases. No intra-abdominal free air or free fluid. There is diffuse thickened appearance of the gallbladder wall as seen on the ultrasound. No calcified gallstone identified. There is enhancement of the gallbladder mucosa. The liver, pancreas, spleen, adrenal glands, kidneys appear unremarkable. There is a low attenuating lobulated mass with no significant enhancement at the splenic hilum extending into splenic parenchyma measuring approximately 1.8 x 3.8 cm. This mass appears to have a somewhat enhancing wall and may represent an abscess or necrotic lymph node, or less  likely a pseudocyst arising from the pancreas. There is a 2.0 x 1.6 cm lobulated retroperitoneal hypodense lesion to the left of the celiac axis similar to the lesion and the splenic hilum which may again represent a necrotic mass or lymph node. Correlation with history and clinical exam recommended. MRI may provide better evaluation. Multiple top-normal retroperitoneal and mesenteric lymph nodes noted. No dilated bowel loops identified. The  visualized abdominal aorta appears unremarkable. The origins of the celiac axis, and SMA, and IMA appear patent. No portal venous gas identified. Small fat containing umbilical hernia. Small pocket of subcutaneous air in the left anterior abdominal wall likely related to recent injection. IMPRESSION: Diffuse thickening of the gallbladder wall as seen on the prior ultrasound. No calcified stone or significant pericholecystic fluid. Lobular hypodense mass at the splenic hilum as well as to the left of the celiac axis may represent necrotic lesion or the carotid lymph nodes. Abscesses are not excluded. Clinical correlation is recommended. MRI may provide further characterization. Electronically Signed   By: Anner Crete M.D.   On: 11/30/2015 00:34   Mr Abdomen W Wo Contrast  11/30/2015  CLINICAL DATA:  Splenic lesion seen on recent CT. HIV. Hepatitis A. EXAM: MRI ABDOMEN WITHOUT AND WITH CONTRAST TECHNIQUE: Multiplanar multisequence MR imaging of the abdomen was performed both before and after the administration of intravenous contrast. CONTRAST:  15 mL MultiHance COMPARISON:  CT on 11/29/2015 FINDINGS: Lower chest:  No acute findings. Hepatobiliary: No hepatic masses are identified. Diffuse gallbladder wall thickening is seen without dilatation, which is nonspecific and may be related to HIV or diffuse hepatocellular disease. No evidence of biliary ductal dilatation. Pancreas:  No definite intrapancreatic mass identified. Spleen: No definite intra splenic mass identified. No evidence of splenomegaly. Adrenals/Urinary Tract: No masses identified. No evidence of hydronephrosis. Stomach/Bowel: Visualized portions within the abdomen are unremarkable. Vascular/Lymphatic: Evaluation is somewhat limited by lack of intra-abdominal fat. Mild T2 hyperintense lymphadenopathy is seen within the porta hepatis. There is a cystic appearing lesion with peripheral contrast enhancement seen just superior to the body of the  pancreas in the left upper quadrant which measures 2.0 x 2.7 cm on image 21/series 7 and image 42 of series 1,104. There is another complex cystic lesion with peripheral enhancement seen in the splenic hilum which is similar appearance and measures 2.7 x 3.4 cm on image 41/series 11,104. The multiplicity of sites favors lymphadenopathy, with cystic areas representing some internal necrosis Other:  None. Musculoskeletal:  No suspicious bone lesions identified. IMPRESSION: Cystic lesions with peripheral contrast enhancement in the splenic hilum and just superior to the pancreatic body, suspicious for lymphadenopathy with central necrosis. Neoplasm such as lymphoma or Kaposi sarcoma cannot be excluded. Abscesses are felt to be less likely but cannot definitely be excluded. Mild solid-appearing lymphadenopathy in the porta hepatis, which is also nonspecific and may be neoplastic or reactive in etiology. Diffuse gallbladder wall thickening without dilatation, which is nonspecific and may related to HIV or diffuse hepatocellular disease. No evidence of hepatic mass or biliary ductal dilatation. Electronically Signed   By: Earle Gell M.D.   On: 11/30/2015 18:05   Nm Hepatobiliary Including Gb  11/28/2015  CLINICAL DATA:  RIGHT upper quadrant pain. HIV and hepatitis 8. Gallbladder wall thickening on ultrasound. Normal common bile duct. Normal bilirubin. EXAM: NUCLEAR MEDICINE HEPATOBILIARY IMAGING TECHNIQUE: Sequential images of the abdomen were obtained out to 60 minutes following intravenous administration of radiopharmaceutical. RADIOPHARMACEUTICALS:  5.2 mCi Tc-5m Choletec IV COMPARISON:  None. FINDINGS: There is relatively rapid clearance of radiotracer from the blood pool and homogeneous uptake in the liver. There is poor clearance of counts from the liver parenchyma over the 2 hour imaging. The gallbladder is evident by 60 minutes and continues to fill up to 120 minutes. Trace amount of bile activity is noted in  the bowel. IMPRESSION: 1. Patent cystic duct. 2. Poor excretion of radiotracer suggest liver dysfunction / hepatitis. Findings conveyed toDr.  Janett Billow 11/28/2015  [id number]. Electronically Signed   By: Suzy Bouchard M.D.   On: 11/28/2015 16:06   US Abdomen Complete  12/25/2015  CLINICAL DATA:  Epigastric abdominal pain.  Hepatitis A  and  HIV. EXAM: ABDOMEN ULTRASOUND COMPLETE COMPARISON:  MRI of 11/30/2015. FINDINGS: Gallbladder: Contracted. Patient ate prior to exam. No stone, wall thickening. Sonographic Murphy's sign was not elicited. Common bile duct: Diameter: Normal, 4 mm. Liver: Hepatomegaly, 21.8 cm craniocaudal.  No focal liver lesion. IVC: No abnormality visualized. Pancreas: Visualized portion unremarkable. Spleen: Cystic lesion within the splenic hilum corresponds to MRI abnormality. Example 3.3 x 1.9 x 2.9 cm. Right Kidney: Length: 13.2 cm. Echogenicity within normal limits. No mass or hydronephrosis visualized. Left Kidney: Length: 12.8 cm. Echogenicity within normal limits. No mass or hydronephrosis visualized. Abdominal aorta: No aneurysm visualized. Other findings: Cystic adenopathy within the porta hepatis and adjacent to the pancreas. Example porta hepatis node which measures 2.3 x 1.6 x 2.8 cm. IMPRESSION: 1. Contracted gallbladder secondary to recent meal. No evidence of acute cholecystitis or biliary duct dilatation. 2. Cystic lesions within the splenic hilum and upper abdomen are likely lymph nodes and were detailed on the recent MRI. Electronically Signed   By: Abigail Miyamoto M.D.   On: 12/25/2015 21:20   Ct Biopsy  12/02/2015  CLINICAL DATA:  HIV.  Fever.  Possible lymphoma. EXAM: CT GUIDED DEEP ILIAC BONE ASPIRATION AND CORE BIOPSY TECHNIQUE: Patient was placed supine on the CT gantry and limited axial scans through the pelvis were obtained. Appropriate skin entry site was identified. Skin site was marked, prepped with Betadine, draped in usual sterile fashion, and infiltrated locally  with 1% lidocaine. Intravenous Fentanyl and Versed were administered as conscious sedation during continuous monitoring of the patient's level of consciousness and physiological / cardiorespiratory status by the radiology RN, with a total moderate sedation time of twelve minutes. Under CT fluoroscopic guidance an 11-gauge Cook trocar bone needle was advanced into the left iliac bone just lateral to the sacroiliac joint. Once needle tip position was confirmed, coaxial core and aspiration samples were obtained. The final sample was obtained using the guiding needle itself, which was then removed. Post procedure scans show no hematoma or fracture. Patient tolerated procedure well. COMPLICATIONS: COMPLICATIONS none IMPRESSION: 1. Technically successful CT guided left iliac bone core and aspiration biopsy. Electronically Signed   By: Lucrezia Europe M.D.   On: 12/02/2015 10:16    Admission HPI: Allison Wilson is a 35 year old female from [place] with a history of HIV/AIDS diagnosed in 2007, CD4 count 100 with VL 8 million presenting [facility] ED tonight with abdominal pain. She reports she has had ongoing abdominal pain for 2 weeks. The pain became significantly worse last night. She reports her pain was in her epigastric region and sharp/stabbing in nature. Pain is worse with food. Associated with 5-10 episodes of non-bloody yellow emesis last night. Reports subjective fever. She denies any chills, diarrhea, chest pain or back pain. She reports that she started [place] and  Truvada prior to the onset of her symptoms. She says she had similar reaction to the medications when starting them several weeks ago and was told to stop them. She had an office visit with Dr. Linus Salmons yesterday and was told to resume the medications and her symptoms developed soon afterwards. She reports no episodes of nausea or vomiting today. She was able to tolerate breakfast this morning and reports good PO intake. Pain has also resolved at this time.     Hospital Course by problem list: Active Problems:   AIDS ([place])   Anemia   Disseminated mycobacteriosis ([place])   Acute kidney injury ([place])   1. Nausea, vomiting, abdominal pain: Patient admitted after 5-10 episodes of vomiting and significant abdominal pain reportedly after restarting her HIV meds at home after several weeks from her previous discharge. She had mild nausea controlled with zofran during this admission. No active vomiting observed. Alk phos notably elevated to 858 at clinic and admission and improved during hospital course to 633. RUQ ultrasound was reviewd and did not show significant liver or biliary dysfunction. She received IV fluid resuscitation with improvement at tolerated PO intake by hospital day 1. ID was consulted to arrange appropriate follow up and discharged to home.  2. Acute kidney injury: Admitted with SCr 1.94 up from ~0.70 baseline from 1 month prior. Improved with IV and PO fluids. Good UOP. Consistent with pre-renal cause based on lab, history, and physical exam supporting dehydration. Discharged with last SCr 1.62.  3. AIDS with disseminated mycobacteriosis: Noted labs from Clinic visit 12/24/15 with HIV [id number] CD4 100. Patient not tolerating meds so just continued ethambutol and azithromycin since she was vomiting with other medicines PTA.  Discharge Vitals:   BP 126/80 mmHg  Pulse 119  Temp(Src) 99.6 F (37.6 C) (Oral)  Resp 20  Ht _0  (1.626 m)  Wt 52 kg (114 lb 10.2 oz)  BMI 19.67 kg/m2  SpO2 100%  LMP 12/09/2015  Discharge Labs:  No results found for this or any previous visit (from the past 24 hour(s)).  Signed: Collier Salina, MD 12/28/2015, 10:45 AM

## 2015-12-26 NOTE — Progress Notes (Signed)
Subjective: Patient feeling well this morning. Abdominal pain and nausea fully resolved. Tolerating good PO intake. Talked about her symptoms and plan for following up with ID to potentially adjust medication regimen to one she can better tolerate.  Objective: Vital signs in last 24 hours: Filed Vitals:   12/25/15 2245 12/25/15 2330 12/26/15 0443 12/26/15 1000  BP: 133/96 130/84 132/80 126/80  Pulse: 98 98 113 119  Temp:  98.8 F (37.1 C) 99.8 F (37.7 C) 99.6 F (37.6 C)  TempSrc:    Oral  Resp:  _0 Height:  _1  (1.626 m)    Weight:  52 kg (114 lb 10.2 oz)    SpO2: 100% 100% 100% 100%   Weight change:   Intake/Output Summary (Last 24 hours) at 12/26/15 1151 Last data filed at 12/26/15 0900  Gross per 24 hour  Intake    360 ml  Output      0 ml  Net    360 ml   GENERAL- alert, co-operative, NAD HEENT- Atraumatic, oral mucosa appears moist, good and intact dentition, no cervical LN enlargement. CARDIAC- Tachycardic, no murmurs, rubs or gallops. RESP- CTAB, no wheezes or crackles. ABDOMEN- Soft, nontender, no guarding or rebound, normoactive bowel sounds present BACK- Normal curvature, no paraspinal tenderness EXTREMITIES- symmetric, no pedal edema. SKIN- Warm, dry, No rash or lesion. PSYCH- Normal mood and affect, appropriate thought content and speech.   Lab Results: Basic Metabolic Panel:  Recent Labs Lab 12/25/15 1630 12/26/15 0712  NA 128* 129*  K 4.1 4.3  CL 97* 105  CO2 22 18*  GLUCOSE 115* 85  BUN 22* 20  CREATININE 1.94* 1.62*  CALCIUM 9.8 8.6*   Liver Function Tests:  Recent Labs Lab 12/25/15 1630 12/26/15 0712  AST 229* 181*  ALT 107* 88*  ALKPHOS 753* 633*  BILITOT 1.5* 1.4*  PROT 11.2* 9.6*  ALBUMIN 1.9* 1.6*    Recent Labs Lab 12/25/15 1630  LIPASE 37   No results for input(s): AMMONIA in the last 168 hours. CBC:  Recent Labs Lab 12/24/15 1056 12/25/15 1630  WBC 3.2* 3.3*  NEUTROABS 2.3  --   HGB 7.9* 8.0*    HCT 24.6* 23.9*  MCV 75.2* 72.2*  PLT 291 298   Cardiac Enzymes: No results for input(s): CKTOTAL, CKMB, CKMBINDEX, TROPONINI in the last 168 hours. BNP: No results for input(s): PROBNP in the last 168 hours. D-Dimer: No results for input(s): DDIMER in the last 168 hours. CBG: No results for input(s): GLUCAP in the last 168 hours. Hemoglobin A1C: No results for input(s): HGBA1C in the last 168 hours. Fasting Lipid Panel:  Recent Labs Lab 12/24/15 1056  CHOL 196  HDL 22*  LDLCALC 144*  TRIG 150*  CHOLHDL 8.9*   Thyroid Function Tests: No results for input(s): TSH, T4TOTAL, FREET4, T3FREE, THYROIDAB in the last 168 hours. Coagulation: No results for input(s): LABPROT, INR in the last 168 hours. Anemia Panel: No results for input(s): VITAMINB12, FOLATE, FERRITIN, TIBC, IRON, RETICCTPCT in the last 168 hours. Urine Drug Screen: Drugs of Abuse  No results found for: LABOPIA, COCAINSCRNUR, LABBENZ, AMPHETMU, THCU, LABBARB  Alcohol Level: No results for input(s): ETH in the last 168 hours. Urinalysis:  Recent Labs Lab 12/25/15 1930  COLORURINE AMBER*  LABSPEC 1.021  PHURINE 5.0  GLUCOSEU NEGATIVE  HGBUR NEGATIVE  BILIRUBINUR SMALL*  KETONESUR NEGATIVE  PROTEINUR 30*  NITRITE NEGATIVE  LEUKOCYTESUR SMALL*    Micro Results: No results found for this or  any previous visit (from the past 240 hour(s)). Studies/Results: US Abdomen Complete  12/25/2015  CLINICAL DATA:  Epigastric abdominal pain.  Hepatitis A  and  HIV. EXAM: ABDOMEN ULTRASOUND COMPLETE COMPARISON:  MRI of 11/30/2015. FINDINGS: Gallbladder: Contracted. Patient ate prior to exam. No stone, wall thickening. Sonographic Murphy's sign was not elicited. Common bile duct: Diameter: Normal, 4 mm. Liver: Hepatomegaly, 21.8 cm craniocaudal.  No focal liver lesion. IVC: No abnormality visualized. Pancreas: Visualized portion unremarkable. Spleen: Cystic lesion within the splenic hilum corresponds to MRI abnormality.  Example 3.3 x 1.9 x 2.9 cm. Right Kidney: Length: 13.2 cm. Echogenicity within normal limits. No mass or hydronephrosis visualized. Left Kidney: Length: 12.8 cm. Echogenicity within normal limits. No mass or hydronephrosis visualized. Abdominal aorta: No aneurysm visualized. Other findings: Cystic adenopathy within the porta hepatis and adjacent to the pancreas. Example porta hepatis node which measures 2.3 x 1.6 x 2.8 cm. IMPRESSION: 1. Contracted gallbladder secondary to recent meal. No evidence of acute cholecystitis or biliary duct dilatation. 2. Cystic lesions within the splenic hilum and upper abdomen are likely lymph nodes and were detailed on the recent MRI. Electronically Signed   By: Abigail Miyamoto M.D.   On: 12/25/2015 21:20   Medications: I have reviewed the patient's current medications. Scheduled Meds: . azithromycin  600 mg Oral Daily  . enoxaparin (LOVENOX) injection  40 mg Subcutaneous Daily  . ethambutol  1,000 mg Oral Daily  . feeding supplement (ENSURE ENLIVE)  237 mL Oral BID BM  . sulfamethoxazole-trimethoprim  1 tablet Oral Daily   Continuous Infusions: . sodium chloride 125 mL/hr at 12/26/15 0400   PRN Meds:.sodium chloride flush Assessment/Plan: AKI: Cr 1.94 on admission, improving. Baseline appears to be around 0.7. Patient with 1 day history of nausea and vomiting. Likely pre-renal given recent vomiting. Abdominal US with no hydronephrosis present. -Can stop IVF for good Po intake resumed -Needs outpt repeat Cmet  AIDS: Patient with history of AIDS diagnosed in 2007. She was lost to follow up and off treatment since 2011. Recently hospitalized 2/3-2/9 with acute febrile illness and weight loss thought to be 2/2 MAC. She was started on empiric therapy for disseminated MAC with Azithromycin 600 mg bid, Ethambutol 1000 mg daily and Rifabutin 300 mg daily along with Bactrim for PCP prophylaxis. Rifabutin was stopped at ID visit 3/2 and Azithromycin reduced to once daily. She  was instructed to resume HAART therapy with Tivicay and Truvada on 2/23. She reports not resuming her medications at that time and started her first dose the night PTA. She developed abdominal pain with nausea and vomiting ~1 hour after taking the Tivicay and Truvada. CD4 count 3/2 was 100 with VL >8 million.  -Continue Azithromycin 600 mg daily and Ethambutol 1000 mg daily -Continue Bactrim 800-160 mg daily -To follow up with ID clinic Monday  Hypovolemic hyponatremia: Has had hyponatremia persistently on past studies, was 130s until earlier this year then down to high 120s in February. Likely a large component of poor PO intake related to her AIDS. Improving with fluids, control of vomiting, and PO fluid intake.  Abdominal pain with N/V: No significant findings on RUQ ultrasound. Elevated alk phos likely related to her repeated vomiting. Also does have Hx of dissemination to liver could be contributory. Now asymptomatic. Consider repeating LFTs outpt.  Microcytic anemia: 8.0, previously in 8s range. Low MCV. No Hx of bloody stools, hematemesis, or unusual periods. Doe shave poor oral intake at high risk for iron deficiency. Also AOCD  in AIDS with disseminated infection. -Anemia panel  VTE ppx: Gloster enoxaparin Diet: Regular FULL CODE  Dispo: Anticipate discharge to home later today.  The patient does not have a current PCP (No primary care provider on file.) and does need an [facility] hospital follow-up appointment after discharge. We will aim to follow this patient at least initially in [facility] as well as they now have significant health conditions beyond/secondary to their HIV/AIDS.  The patient does not have transportation limitations that hinder transportation to clinic appointments.  Collier Salina, MD 12/26/2015, 11:51 AM

## 2015-12-28 ENCOUNTER — Telehealth: Payer: Self-pay | Admitting: *Deleted

## 2015-12-28 MED FILL — raNITIdine HCL 150 MG TABS: 150 | 30 days supply | Qty: 60 | Fill #0

## 2015-12-28 NOTE — Telephone Encounter (Signed)
Needing new regimen per Dr. Luciana Axeomer, detectable VL.  Pt scheduled for appt with M. Pham for 12/29/2015.

## 2015-12-29 ENCOUNTER — Ambulatory Visit: Payer: BLUE CROSS/BLUE SHIELD | Admitting: Pharmacist Clinician (PhC)/ Clinical Pharmacy Specialist

## 2015-12-29 DIAGNOSIS — B20 Human immunodeficiency virus [HIV] disease: Secondary | ICD-10-CM

## 2015-12-29 MED ORDER — EMTRICITABINE-TENOFOVIR AF 200-25 MG PO TABS: 1.0000 | ORAL_TABLET | Freq: Every day | ORAL | Status: DC

## 2015-12-29 MED ORDER — DOLUTEGRAVIR SODIUM 50 MG PO TABS: 50.0000 mg | ORAL_TABLET | Freq: Every day | ORAL | Status: DC

## 2015-12-29 MED FILL — TIVICAY 50 MG TABLET: 50 | 30 days supply | Qty: 30 | Fill #0

## 2015-12-29 MED FILL — DESCOVY 200-25 MG TABS: 200-25 | 30 days supply | Qty: 30 | Fill #0

## 2015-12-29 NOTE — Progress Notes (Signed)
Patient ID: Karren CobbleHasanatu Plummer, female   DOB: [birth year], 35 y.o.   MRN: [id number] HPI: Myrtie CruiseHasanatu Calo's husband is here for her visit. She is still at home sick.   Allergies: No Known Allergies  Vitals:    Past Medical History: Past Medical History  Diagnosis Date  . HIV (human immunodeficiency virus infection) (HCC)   . Hepatitis A     Social History: Social History   Social History  . Marital Status: Married    Spouse Name: N/A  . Number of Children: N/A  . Years of Education: N/A   Social History Main Topics  . Smoking status: Never Smoker   . Smokeless tobacco: Never Used  . Alcohol Use: No  . Drug Use: No  . Sexual Activity:    Partners: Male   Other Topics Concern  . Not on file   Social History Narrative    Previous Regimen: None    Current Regimen: Off right now  Labs: HIV 1 RNA QUANT (copies/mL)  Date Value  12/24/2015 [id number]*  03/01/2010 245*  11/18/2009 170*   CD4 T CELL ABS  Date Value  12/24/2015 100 /uL*  11/27/2015 60 /uL*  03/01/2010 200 cmm*   HEP B S AB (no units)  Date Value  11/27/2015 Reactive   HEPATITIS B SURFACE AG (no units)  Date Value  11/27/2015 Negative   HCV AB (no units)  Date Value  12/18/2006 NO    CrCl: Estimated Creatinine Clearance: 39.8 mL/min (by C-G formula based on Cr of 1.62).  Lipids:    Component Value Date/Time   CHOL 196 12/24/2015 1056   TRIG 150* 12/24/2015 1056   HDL 22* 12/24/2015 1056   CHOLHDL 8.9* 12/24/2015 1056   VLDL 30 12/24/2015 1056   LDLCALC 144* 12/24/2015 1056    Assessment: Merrilyn's husband is here for her visit since she is still "sick" at home. She has had some complication recently with MAC as well as not tolerating ART. After talking to the husband, it looks like she has never been on therapy at anytime. I asked him several times and he confirmed this. We saw them last week her in the clinic. She has started on TRV/DTG but then stopped after the nausea issue. She  is still taking the antimycobacterial and the nausea lingers. She blamed it on the HIV meds of DTG/TRV but he stated that she had this issue before starting the ART. I think the big issue here is that she doesn't eat much due to poor appetite. Therefore, she has been taking these on an empty stomach. I think this is the issue with the nausea and vomiting. Instructed him to make sure she take zofran before the daily meds everyday and can be used as needed up to 3x day. We are going to use the same ART regimen that he is on (Descovy/DTG) since she is off of rifabutin now. Husband called back and stated that he has picked up all the meds at the outpt pharmacy. He has also received his meds in the mail today also.   Recommendations:  Take meds with food Restart Tivicay 1 PO qday Start Descovy 1 PO qday Zofran 4mg  qday with meds and PRN Rx sent to outpt pharmacy  Clide CliffPham, Bryanna Yim Quang, PharmD Clinical Infectious Disease Pharmacist [facility] 12/29/2015, 2:10 PM

## 2015-12-30 LAB — HLA B*5701: HLA-B*5701 w/rflx HLA-B High: NEGATIVE

## 2016-01-25 MED FILL — DESCOVY 200-25 MG TABS: 200-25 | 30 days supply | Qty: 30 | Fill #1

## 2016-01-25 MED FILL — TIVICAY 50 MG TABLET: 50 | 30 days supply | Qty: 30 | Fill #1

## 2016-01-26 ENCOUNTER — Ambulatory Visit (INDEPENDENT_AMBULATORY_CARE_PROVIDER_SITE_OTHER): Payer: BLUE CROSS/BLUE SHIELD | Admitting: Internal Medicine

## 2016-01-26 ENCOUNTER — Telehealth: Payer: Self-pay | Admitting: *Deleted

## 2016-01-26 VITALS — BP 119/77 | HR 110 | Temp 98.5°F | Wt 125.0 lb

## 2016-01-26 DIAGNOSIS — A312 Disseminated mycobacterium avium-intracellulare complex (DMAC): Secondary | ICD-10-CM | POA: Diagnosis not present

## 2016-01-26 DIAGNOSIS — N179 Acute kidney failure, unspecified: Secondary | ICD-10-CM | POA: Diagnosis not present

## 2016-01-26 DIAGNOSIS — B20 Human immunodeficiency virus [HIV] disease: Secondary | ICD-10-CM

## 2016-01-26 DIAGNOSIS — A319 Mycobacterial infection, unspecified: Secondary | ICD-10-CM

## 2016-01-26 LAB — CBC WITH DIFFERENTIAL/PLATELET
BASOS ABS: 40 {cells}/uL (ref 0–200)
Basophils Relative: 1 %
Eosinophils Absolute: 80 cells/uL (ref 15–500)
Eosinophils Relative: 2 %
HEMATOCRIT: 21.8 % — AB (ref 35.0–45.0)
HEMOGLOBIN: 7.1 g/dL — AB (ref 11.7–15.5)
LYMPHS PCT: 11 %
Lymphs Abs: 440 cells/uL — ABNORMAL LOW (ref 850–3900)
MCH: 25.3 pg — AB (ref 27.0–33.0)
MCHC: 32.6 g/dL (ref 32.0–36.0)
MCV: 77.6 fL — ABNORMAL LOW (ref 80.0–100.0)
MONO ABS: 600 {cells}/uL (ref 200–950)
MPV: 8.2 fL (ref 7.5–12.5)
Monocytes Relative: 15 %
NEUTROS PCT: 71 %
Neutro Abs: 2840 cells/uL (ref 1500–7800)
Platelets: 350 10*3/uL (ref 140–400)
RBC: 2.81 MIL/uL — AB (ref 3.80–5.10)
RDW: 20.9 % — AB (ref 11.0–15.0)
WBC: 4 10*3/uL (ref 3.8–10.8)

## 2016-01-26 NOTE — Telephone Encounter (Signed)
Medical Exam Certification form filled out by Dr. Luciana Axeomer and faxed to "[name]" at [phone]. Dr. Luciana Axeomer wrote patient out of work through 02/21/16. She can return to work on 02/22/16. Confirmation of fax received. Allison Wilson

## 2016-01-26 NOTE — Assessment & Plan Note (Signed)
Some elevation in creat last visit, likely due to prerenal with n/v.  Will repeat today.

## 2016-01-26 NOTE — Assessment & Plan Note (Addendum)
Will check labs today and hopefully improving.  Will fill out short term disability through April, can return to work May 1st.

## 2016-01-26 NOTE — Assessment & Plan Note (Signed)
I will have her continue with ethambutol and azithromycin until her CD4 rises

## 2016-01-26 NOTE — Progress Notes (Signed)
Patient ID: Allison Wilson, female   DOB: [birth year], 35 y.o.   MRN: [id number] CC: Follow up for HIV  Interval history: Currently is asymptomatic and well-controlled on Tivicay and Descovy.  Since last visit she stopped the rifabutin due to n/v but unfortunately also stopped the ethambutol and azithromycin for her MAI.  Feels much better though with no further n/v.  Has no associated weight loss.  Denies any missed doses.     Also on bactrim of OI prophylaxis.  No fever, no chills. Asking for continuation of her short term disability.   Prior to Admission medications   Medication Sig Start Date End Date Taking? Authorizing Provider  dolutegravir (TIVICAY) 50 MG tablet Take 1 tablet (50 mg total) by mouth daily. 12/29/15  Yes Gardiner Barefootobert W Comer, MD  emtricitabine-tenofovir AF (DESCOVY) 200-25 MG tablet Take 1 tablet by mouth daily. 12/29/15  Yes Gardiner Barefootobert W Comer, MD  feeding supplement, ENSURE ENLIVE, (ENSURE ENLIVE) LIQD Take 237 mLs by mouth 2 (two) times daily between meals. Patient taking differently: Take 237 mLs by mouth daily.  12/03/15  Yes Rushil Terrilee CroakPatel V, MD  ondansetron (ZOFRAN) 4 MG tablet Take 1 tablet (4 mg total) by mouth every 8 (eight) hours as needed for nausea or vomiting. 12/24/15  Yes Gardiner Barefootobert W Comer, MD  ranitidine (ZANTAC) 150 MG tablet Take 1 tablet (150 mg total) by mouth 2 (two) times daily. 12/26/15  Yes Fuller Planhristopher W Rice, MD  azithromycin (ZITHROMAX) 600 MG tablet Take 1 tablet (600 mg total) by mouth daily. Patient not taking: Reported on 12/25/2015 12/24/15   Gardiner Barefootobert W Comer, MD  ethambutol (MYAMBUTOL) 400 MG tablet Take 2.5 tablets (1,000 mg total) by mouth daily. Patient not taking: Reported on 01/26/2016 12/24/15   Gardiner Barefootobert W Comer, MD  sulfamethoxazole-trimethoprim (BACTRIM DS,SEPTRA DS) 800-160 MG tablet Take 1 tablet by mouth daily. Patient not taking: Reported on 01/26/2016 12/03/15   Beather Arbourushil Patel V, MD    Review of Systems Constitutional: negative for fevers and chills Cardiovascular:  negative for chest pain Gastrointestinal: negative for nausea, vomiting and diarrhea Musculoskeletal: negative for myalgias and arthralgias All other systems reviewed and are negative   Physical Exam: CONSTITUTIONAL:in no apparent distress and alert  Filed Vitals:   01/26/16 0908  BP: 119/77  Pulse: 110  Temp: 98.5 F (36.9 C)   Eyes: anicteric HENT: no thrush, no cervical lymphadenopathy Respiratory: Normal respiratory effort; CTA B GI: soft, nt, nd Skin: no rashes  Lab Results  Component Value Date   HIV1RNAQUANT [id number]* 12/24/2015   HIV1RNAQUANT 245* 03/01/2010   HIV1RNAQUANT 170* 11/18/2009   No components found for: HIV1GENOTYPRPLUS No components found for: THELPERCELL

## 2016-01-27 LAB — T-HELPER CELL (CD4) - (RCID CLINIC ONLY)
CD4 % Helper T Cell: 30 % — ABNORMAL LOW (ref 33–55)
CD4 T CELL ABS: 150 /uL — AB (ref 400–2700)

## 2016-01-27 LAB — COMPLETE METABOLIC PANEL WITH GFR
ALBUMIN: 2 g/dL — AB (ref 3.6–5.1)
ALK PHOS: 615 U/L — AB (ref 33–115)
ALT: 53 U/L — AB (ref 6–29)
AST: 105 U/L — AB (ref 10–30)
BUN: 12 mg/dL (ref 7–25)
CALCIUM: 8.6 mg/dL (ref 8.6–10.2)
CHLORIDE: 106 mmol/L (ref 98–110)
CO2: 22 mmol/L (ref 20–31)
CREATININE: 0.74 mg/dL (ref 0.50–1.10)
GFR, Est African American: >89 mL/min (ref >=60)
GFR, Est Non African American: >89 mL/min (ref >=60)
GLUCOSE: 81 mg/dL (ref 65–99)
Potassium: 3.5 mmol/L (ref 3.5–5.3)
SODIUM: 130 mmol/L — AB (ref 135–146)
Total Bilirubin: 0.6 mg/dL (ref 0.2–1.2)
Total Protein: 9.8 g/dL — ABNORMAL HIGH (ref 6.1–8.1)

## 2016-01-27 LAB — HIV-1 RNA QUANT-NO REFLEX-BLD
HIV 1 RNA QUANT: 383 {copies}/mL — AB (ref <20)
HIV-1 RNA QUANT, LOG: 2.58 {Log_copies}/mL — AB (ref <1.30)

## 2016-01-28 ENCOUNTER — Other Ambulatory Visit: Payer: Self-pay | Admitting: Internal Medicine

## 2016-01-28 MED FILL — SULFAMETHOXAZOLE/TMP DS TAB: 800-160 | 30 days supply | Qty: 30 | Fill #0

## 2016-01-28 MED FILL — ETHAMBUTOL HCL 400 MG TAB: 400 | 30 days supply | Qty: 75 | Fill #0

## 2016-02-10 ENCOUNTER — Encounter: Payer: Self-pay | Admitting: *Deleted

## 2016-02-16 ENCOUNTER — Telehealth: Payer: Self-pay | Admitting: *Deleted

## 2016-02-16 NOTE — Telephone Encounter (Signed)
Patient called concerned that she has not received a check from her employment for her short term disability. Dr. Luciana Axeomer wrote her out of work through 02/21/16. She can return to regular duties on 02/22/16. Called and spoke to disability department at [employer] [phone] and they have pended it because a letter was sent to the patient's home stating that office notes were needed to support the need to be out of work. Office notes from 12/10/15 through 01/26/16 faxed to [phone], attention [facility]. Confirmation received and patient notified.

## 2016-02-23 MED FILL — TIVICAY 50 MG TABLET: 50 | 30 days supply | Qty: 30 | Fill #2

## 2016-02-23 MED FILL — DESCOVY 200-25 MG TABS: 200-25 | 30 days supply | Qty: 30 | Fill #2

## 2016-02-25 ENCOUNTER — Other Ambulatory Visit: Payer: Self-pay | Admitting: Internal Medicine

## 2016-02-25 MED ORDER — VALACYCLOVIR HCL 1 G PO TABS: 1000.0000 mg | ORAL_TABLET | Freq: Two times a day (BID) | ORAL | Status: DC

## 2016-02-25 MED FILL — valACYclovir HCL 1 GM TABS: 1 | 7 days supply | Qty: 14 | Fill #0

## 2016-03-02 MED FILL — valACYclovir HCL 1 GM TABS: 1 | 7 days supply | Qty: 14 | Fill #1

## 2016-03-23 MED FILL — TIVICAY 50 MG TABLET: 50 | 30 days supply | Qty: 30 | Fill #3

## 2016-03-23 MED FILL — DESCOVY 200-25 MG TABS: 200-25 | 30 days supply | Qty: 30 | Fill #3

## 2016-04-18 ENCOUNTER — Ambulatory Visit (INDEPENDENT_AMBULATORY_CARE_PROVIDER_SITE_OTHER): Payer: BLUE CROSS/BLUE SHIELD | Admitting: Internal Medicine

## 2016-04-18 ENCOUNTER — Encounter: Payer: Self-pay | Admitting: Internal Medicine

## 2016-04-18 VITALS — BP 124/83 | HR 82 | Temp 98.4°F | Wt 138.0 lb

## 2016-04-18 DIAGNOSIS — B20 Human immunodeficiency virus [HIV] disease: Secondary | ICD-10-CM

## 2016-04-18 DIAGNOSIS — R7989 Other specified abnormal findings of blood chemistry: Secondary | ICD-10-CM

## 2016-04-18 DIAGNOSIS — R509 Fever, unspecified: Secondary | ICD-10-CM

## 2016-04-18 DIAGNOSIS — D509 Iron deficiency anemia, unspecified: Secondary | ICD-10-CM

## 2016-04-18 DIAGNOSIS — A319 Mycobacterial infection, unspecified: Secondary | ICD-10-CM

## 2016-04-18 DIAGNOSIS — A312 Disseminated mycobacterium avium-intracellulare complex (DMAC): Secondary | ICD-10-CM | POA: Diagnosis not present

## 2016-04-18 DIAGNOSIS — R945 Abnormal results of liver function studies: Secondary | ICD-10-CM

## 2016-04-19 LAB — CBC WITH DIFFERENTIAL/PLATELET
BASOS ABS: 0 {cells}/uL (ref 0–200)
Basophils Relative: 0 %
Eosinophils Absolute: 186 cells/uL (ref 15–500)
Eosinophils Relative: 6 %
HEMATOCRIT: 27.6 % — AB (ref 35.0–45.0)
Hemoglobin: 8.9 g/dL — ABNORMAL LOW (ref 11.7–15.5)
LYMPHS ABS: 806 {cells}/uL — AB (ref 850–3900)
LYMPHS PCT: 26 %
MCH: 26.3 pg — AB (ref 27.0–33.0)
MCHC: 32.2 g/dL (ref 32.0–36.0)
MCV: 81.7 fL (ref 80.0–100.0)
MONOS PCT: 12 %
MPV: 9.1 fL (ref 7.5–12.5)
Monocytes Absolute: 372 cells/uL (ref 200–950)
NEUTROS ABS: 1736 {cells}/uL (ref 1500–7800)
NEUTROS PCT: 56 %
Platelets: 280 10*3/uL (ref 140–400)
RBC: 3.38 MIL/uL — ABNORMAL LOW (ref 3.80–5.10)
RDW: 15 % (ref 11.0–15.0)
WBC: 3.1 10*3/uL — AB (ref 3.8–10.8)

## 2016-04-19 LAB — COMPLETE METABOLIC PANEL WITH GFR
ALBUMIN: 3 g/dL — AB (ref 3.6–5.1)
ALK PHOS: 159 U/L — AB (ref 33–115)
ALT: 31 U/L — ABNORMAL HIGH (ref 6–29)
AST: 44 U/L — AB (ref 10–30)
BUN: 20 mg/dL (ref 7–25)
CO2: 20 mmol/L (ref 20–31)
Calcium: 8.9 mg/dL (ref 8.6–10.2)
Chloride: 107 mmol/L (ref 98–110)
Creat: 0.82 mg/dL (ref 0.50–1.10)
GLUCOSE: 73 mg/dL (ref 65–99)
POTASSIUM: 4 mmol/L (ref 3.5–5.3)
SODIUM: 132 mmol/L — AB (ref 135–146)
Total Bilirubin: 0.2 mg/dL (ref 0.2–1.2)
Total Protein: 11.5 g/dL — ABNORMAL HIGH (ref 6.1–8.1)

## 2016-04-19 LAB — T-HELPER CELL (CD4) - (RCID CLINIC ONLY)
CD4 T CELL HELPER: 21 % — AB (ref 33–55)
CD4 T Cell Abs: 200 /uL — ABNORMAL LOW (ref 400–2700)

## 2016-04-19 LAB — HIV-1 RNA QUANT-NO REFLEX-BLD
HIV 1 RNA Quant: <20 copies/mL (ref <20)
HIV-1 RNA Quant, Log: <1.3 Log copies/mL (ref <1.30)

## 2016-04-19 NOTE — Assessment & Plan Note (Signed)
No culture ever grew and now off of treatment.  I think this is reasonable since she has been doing well, afebrile and good weight gain.

## 2016-04-19 NOTE — Assessment & Plan Note (Signed)
Now resolved.  

## 2016-04-19 NOTE — Assessment & Plan Note (Signed)
Labs today and seems to be doing well.   She will continue bactrim prophylaxis

## 2016-04-19 NOTE — Assessment & Plan Note (Signed)
Will recheck again today.

## 2016-04-19 NOTE — Assessment & Plan Note (Signed)
improved

## 2016-04-19 NOTE — Progress Notes (Signed)
Patient ID: Allison Wilson, female   DOB: [birth year], 35 y.o.   MRN: [id number] CC: Follow up for HIV  Interval history: Currently is asymptomatic and well-controlled on Tivicay and Descovy.  Since last visit she stopped the MAC therapy due to n/v again and continues to do well.  Has no associated weight loss.  Denies any missed doses.   No positive culture ever grew, just bone marrow with granulomas on path.   Also on bactrim of OI prophylaxis, though has not been taking that either.  No fever, no chills. Now also back to work.    Prior to Admission medications   Medication Sig Start Date End Date Taking? Authorizing Provider  dolutegravir (TIVICAY) 50 MG tablet Take 1 tablet (50 mg total) by mouth daily. 12/29/15  Yes Gardiner Barefootobert W Comer, MD  emtricitabine-tenofovir AF (DESCOVY) 200-25 MG tablet Take 1 tablet by mouth daily. 12/29/15  Yes Gardiner Barefootobert W Comer, MD  feeding supplement, ENSURE ENLIVE, (ENSURE ENLIVE) LIQD Take 237 mLs by mouth 2 (two) times daily between meals. Patient taking differently: Take 237 mLs by mouth daily.  12/03/15  Yes Rushil Terrilee CroakPatel V, MD  ondansetron (ZOFRAN) 4 MG tablet Take 1 tablet (4 mg total) by mouth every 8 (eight) hours as needed for nausea or vomiting. 12/24/15  Yes Gardiner Barefootobert W Comer, MD  ranitidine (ZANTAC) 150 MG tablet Take 1 tablet (150 mg total) by mouth 2 (two) times daily. 12/26/15  Yes Fuller Planhristopher W Rice, MD  azithromycin (ZITHROMAX) 600 MG tablet Take 1 tablet (600 mg total) by mouth daily. Patient not taking: Reported on 12/25/2015 12/24/15   Gardiner Barefootobert W Comer, MD  ethambutol (MYAMBUTOL) 400 MG tablet Take 2.5 tablets (1,000 mg total) by mouth daily. Patient not taking: Reported on 01/26/2016 12/24/15   Gardiner Barefootobert W Comer, MD  sulfamethoxazole-trimethoprim (BACTRIM DS,SEPTRA DS) 800-160 MG tablet Take 1 tablet by mouth daily. Patient not taking: Reported on 01/26/2016 12/03/15   Beather Arbourushil Patel V, MD    Review of Systems Constitutional: negative for fevers and chills Cardiovascular:  negative for chest pain Gastrointestinal: negative for nausea, vomiting and diarrhea Musculoskeletal: negative for myalgias and arthralgias All other systems reviewed and are negative   Physical Exam: CONSTITUTIONAL:in no apparent distress and alert  Filed Vitals:   04/18/16 1547  BP: 124/83  Pulse: 82  Temp: 98.4 F (36.9 C)   Eyes: anicteric HENT: no thrush, no cervical lymphadenopathy Respiratory: Normal respiratory effort; CTA B GI: soft, nt, nd Skin: no rashes  Lab Results  Component Value Date   HIV1RNAQUANT 383* 01/26/2016   HIV1RNAQUANT [id number]* 12/24/2015   HIV1RNAQUANT 245* 03/01/2010   No components found for: HIV1GENOTYPRPLUS No components found for: THELPERCELL

## 2016-04-21 MED FILL — DESCOVY 200-25 MG TABS: 200-25 | 30 days supply | Qty: 30 | Fill #4

## 2016-04-21 MED FILL — TIVICAY 50 MG TABLET: 50 | 30 days supply | Qty: 30 | Fill #4

## 2016-05-17 LAB — AFB CULTURE, BLOOD

## 2016-05-20 MED FILL — DESCOVY 200-25 MG TABS: 200-25 | 30 days supply | Qty: 30 | Fill #5

## 2016-05-20 MED FILL — TIVICAY 50 MG TABLET: 50 | 30 days supply | Qty: 30 | Fill #5

## 2016-06-20 ENCOUNTER — Other Ambulatory Visit: Payer: Self-pay | Admitting: Internal Medicine

## 2016-06-20 MED FILL — TIVICAY 50 MG TABLET: 50 | 30 days supply | Qty: 30 | Fill #0

## 2016-06-20 MED FILL — DESCOVY 200-25 MG TABS: 200-25 | 30 days supply | Qty: 30 | Fill #0

## 2016-06-28 ENCOUNTER — Other Ambulatory Visit: Payer: BLUE CROSS/BLUE SHIELD

## 2016-07-12 ENCOUNTER — Telehealth: Payer: Self-pay | Admitting: *Deleted

## 2016-07-12 ENCOUNTER — Ambulatory Visit: Payer: BLUE CROSS/BLUE SHIELD | Admitting: Internal Medicine

## 2016-07-12 NOTE — Telephone Encounter (Signed)
Called and left a message with patient's husband that Dr. Luciana Axeomer would like her to have a lab appointment next month prior to her MD appt in November. [name]

## 2016-07-19 MED FILL — DESCOVY 200-25 MG TABS: 200-25 | 30 days supply | Qty: 30 | Fill #1

## 2016-07-20 MED FILL — TIVICAY 50 MG TABLET: 50 | 30 days supply | Qty: 30 | Fill #1

## 2016-08-17 MED FILL — DESCOVY 200-25 MG TABS: 200-25 | 30 days supply | Qty: 30 | Fill #2

## 2016-08-18 MED FILL — TIVICAY 50 MG TABLET: 50 | 30 days supply | Qty: 30 | Fill #2

## 2016-08-30 ENCOUNTER — Other Ambulatory Visit: Payer: BLUE CROSS/BLUE SHIELD

## 2016-08-30 DIAGNOSIS — B20 Human immunodeficiency virus [HIV] disease: Secondary | ICD-10-CM

## 2016-08-31 LAB — T-HELPER CELL (CD4) - (RCID CLINIC ONLY)
CD4 % Helper T Cell: 16 % — ABNORMAL LOW (ref 33–55)
CD4 T Cell Abs: 180 /uL — ABNORMAL LOW (ref 400–2700)

## 2016-09-01 LAB — HIV-1 RNA QUANT-NO REFLEX-BLD
HIV 1 RNA Quant: <20 copies/mL (ref <20)
HIV-1 RNA Quant, Log: <1.3 Log copies/mL (ref <1.30)

## 2016-09-13 ENCOUNTER — Ambulatory Visit: Payer: BLUE CROSS/BLUE SHIELD | Admitting: Internal Medicine

## 2016-09-16 MED FILL — TIVICAY 50 MG TABLET: 50 | 30 days supply | Qty: 30 | Fill #3

## 2016-09-16 MED FILL — DESCOVY 200-25 MG TABS: 200-25 | 30 days supply | Qty: 30 | Fill #3

## 2016-10-25 ENCOUNTER — Ambulatory Visit: Payer: BLUE CROSS/BLUE SHIELD | Admitting: Internal Medicine

## 2016-10-31 MED FILL — DESCOVY 200-25 MG TABS: 200-25 | 30 days supply | Qty: 30 | Fill #4 | Status: TO

## 2016-10-31 MED FILL — TIVICAY 50 MG TABLET: 50 | 30 days supply | Qty: 30 | Fill #4 | Status: TO

## 2016-11-28 ENCOUNTER — Ambulatory Visit (INDEPENDENT_AMBULATORY_CARE_PROVIDER_SITE_OTHER): Payer: BLUE CROSS/BLUE SHIELD | Admitting: Internal Medicine

## 2016-11-28 ENCOUNTER — Encounter: Payer: Self-pay | Admitting: Internal Medicine

## 2016-11-28 VITALS — BP 152/98 | HR 73 | Temp 97.7°F | Wt 156.0 lb

## 2016-11-28 DIAGNOSIS — Z113 Encounter for screening for infections with a predominantly sexual mode of transmission: Secondary | ICD-10-CM | POA: Diagnosis not present

## 2016-11-28 DIAGNOSIS — Z79899 Other long term (current) drug therapy: Secondary | ICD-10-CM | POA: Diagnosis not present

## 2016-11-28 DIAGNOSIS — B20 Human immunodeficiency virus [HIV] disease: Secondary | ICD-10-CM | POA: Insufficient documentation

## 2016-11-28 DIAGNOSIS — Z298 Encounter for other specified prophylactic measures: Secondary | ICD-10-CM

## 2016-11-29 DIAGNOSIS — Z298 Encounter for other specified prophylactic measures: Secondary | ICD-10-CM | POA: Insufficient documentation

## 2016-11-29 DIAGNOSIS — Z2989 Encounter for other specified prophylactic measures: Secondary | ICD-10-CM | POA: Insufficient documentation

## 2016-11-29 MED FILL — TIVICAY 50 MG TABLET: 50 | 30 days supply | Qty: 30 | Fill #0

## 2016-11-29 MED FILL — DESCOVY 200-25 MG TABS: 200-25 | 30 days supply | Qty: 30 | Fill #0

## 2016-11-29 NOTE — Assessment & Plan Note (Signed)
Stable.  rtc 3 months.

## 2016-11-29 NOTE — Progress Notes (Signed)
   Subjective:    Patient ID: Allison CobbleHasanatu Wilson, female    DOB: [birth year], 36 y.o.   MRN: [id number]  HPI Here for follow up. Last seen in June 2017.  No new issues. Continues on Tivicay and descovy along with Bactrim OI prophylaxis.  No missed doses.  Labs in November with CD4 180 and < 20 vl.  No new issues.     Review of Systems  Constitutional: Negative for fatigue.  Gastrointestinal: Negative for diarrhea.  Skin: Negative for rash.       Objective:   Physical Exam  Constitutional: She appears well-developed and well-nourished.  Cardiovascular: Normal rate, regular rhythm and normal heart sounds.   Skin: No rash noted.          Assessment & Plan:

## 2016-11-29 NOTE — Assessment & Plan Note (Signed)
Will continue with bactrim for now.

## 2017-01-04 ENCOUNTER — Other Ambulatory Visit: Payer: Self-pay | Admitting: Pharmacist

## 2017-01-04 DIAGNOSIS — B20 Human immunodeficiency virus [HIV] disease: Secondary | ICD-10-CM

## 2017-01-04 MED ORDER — EMTRICITABINE-TENOFOVIR AF 200-25 MG PO TABS: 1.0000 | ORAL_TABLET | Freq: Every day | ORAL | 5 refills | Status: DC

## 2017-01-04 MED ORDER — DOLUTEGRAVIR SODIUM 50 MG PO TABS: 50.0000 mg | ORAL_TABLET | Freq: Every day | ORAL | 5 refills | Status: DC

## 2017-01-25 ENCOUNTER — Telehealth: Payer: Self-pay | Admitting: Pharmacy Technician

## 2017-01-25 NOTE — Telephone Encounter (Signed)
Left 4 VM messages with Ms. Puller and 1 VM message with her husband Sherilyn Cooter to call me back about a refill.  No response

## 2017-02-08 MED FILL — TIVICAY 50 MG TABLET: 50 | 30 days supply | Qty: 30 | Fill #0

## 2017-02-08 MED FILL — DESCOVY 200-25 MG TABS: 200-25 | 30 days supply | Qty: 30 | Fill #0

## 2017-02-13 ENCOUNTER — Other Ambulatory Visit: Payer: BLUE CROSS/BLUE SHIELD

## 2017-02-13 ENCOUNTER — Other Ambulatory Visit (HOSPITAL_COMMUNITY)
Admission: RE | Admit: 2017-02-13 | Discharge: 2017-02-13 | Disposition: A | Discharge status: Home / Self Care | Payer: BLUE CROSS/BLUE SHIELD | Source: Ambulatory Visit | Attending: Internal Medicine | Admitting: Internal Medicine

## 2017-02-13 DIAGNOSIS — Z113 Encounter for screening for infections with a predominantly sexual mode of transmission: Secondary | ICD-10-CM | POA: Diagnosis not present

## 2017-02-13 DIAGNOSIS — B20 Human immunodeficiency virus [HIV] disease: Secondary | ICD-10-CM | POA: Insufficient documentation

## 2017-02-13 DIAGNOSIS — Z79899 Other long term (current) drug therapy: Secondary | ICD-10-CM

## 2017-02-13 LAB — COMPLETE METABOLIC PANEL WITH GFR
ALT: 13 U/L (ref 6–29)
AST: 18 U/L (ref 10–30)
Albumin: 3.4 g/dL — ABNORMAL LOW (ref 3.6–5.1)
Alkaline Phosphatase: 77 U/L (ref 33–115)
BILIRUBIN TOTAL: 0.2 mg/dL (ref 0.2–1.2)
BUN: 13 mg/dL (ref 7–25)
CO2: 24 mmol/L (ref 20–31)
Calcium: 9 mg/dL (ref 8.6–10.2)
Chloride: 104 mmol/L (ref 98–110)
Creat: 1 mg/dL (ref 0.50–1.10)
GFR, Est African American: 84 mL/min (ref >=60)
GFR, Est Non African American: 73 mL/min (ref >=60)
GLUCOSE: 84 mg/dL (ref 65–99)
Potassium: 3.6 mmol/L (ref 3.5–5.3)
Sodium: 133 mmol/L — ABNORMAL LOW (ref 135–146)
TOTAL PROTEIN: 8.8 g/dL — AB (ref 6.1–8.1)

## 2017-02-13 LAB — LIPID PANEL
CHOL/HDL RATIO: 2.8 ratio (ref <5.0)
CHOLESTEROL: 176 mg/dL (ref <200)
HDL: 64 mg/dL (ref >50)
LDL Cholesterol: 95 mg/dL (ref <100)
Triglycerides: 86 mg/dL (ref <150)
VLDL: 17 mg/dL (ref <30)

## 2017-02-13 LAB — CBC WITH DIFFERENTIAL/PLATELET
BASOS ABS: 31 {cells}/uL (ref 0–200)
Basophils Relative: 1 %
EOS ABS: 124 {cells}/uL (ref 15–500)
Eosinophils Relative: 4 %
HCT: 30.9 % — ABNORMAL LOW (ref 35.0–45.0)
Hemoglobin: 9.8 g/dL — ABNORMAL LOW (ref 11.7–15.5)
Lymphocytes Relative: 32 %
Lymphs Abs: 992 cells/uL (ref 850–3900)
MCH: 26.2 pg — AB (ref 27.0–33.0)
MCHC: 31.7 g/dL — ABNORMAL LOW (ref 32.0–36.0)
MCV: 82.6 fL (ref 80.0–100.0)
MONO ABS: 403 {cells}/uL (ref 200–950)
MONOS PCT: 13 %
MPV: 9.9 fL (ref 7.5–12.5)
Neutro Abs: 1550 cells/uL (ref 1500–7800)
Neutrophils Relative %: 50 %
Platelets: 225 10*3/uL (ref 140–400)
RBC: 3.74 MIL/uL — ABNORMAL LOW (ref 3.80–5.10)
RDW: 15.8 % — ABNORMAL HIGH (ref 11.0–15.0)
WBC: 3.1 10*3/uL — ABNORMAL LOW (ref 3.8–10.8)

## 2017-02-14 LAB — URINE CYTOLOGY ANCILLARY ONLY
Chlamydia: NEGATIVE
NEISSERIA GONORRHEA: NEGATIVE

## 2017-02-14 LAB — T-HELPER CELL (CD4) - (RCID CLINIC ONLY)
CD4 % Helper T Cell: 19 % — ABNORMAL LOW (ref 33–55)
CD4 T Cell Abs: 200 /uL — ABNORMAL LOW (ref 400–2700)

## 2017-02-14 LAB — RPR

## 2017-02-15 LAB — HIV-1 RNA QUANT-NO REFLEX-BLD
HIV 1 RNA Quant: <20 copies/mL
HIV-1 RNA QUANT, LOG: NOT DETECTED {Log_copies}/mL

## 2017-02-27 ENCOUNTER — Ambulatory Visit: Payer: BLUE CROSS/BLUE SHIELD | Admitting: Internal Medicine

## 2017-02-28 ENCOUNTER — Telehealth: Payer: Self-pay | Admitting: Pharmacist

## 2017-02-28 NOTE — Telephone Encounter (Signed)
Tried to call patient about refills but it looks [facility] Will f/u with patient next week when she sees Dr. Luciana Axeomer.

## 2017-03-06 ENCOUNTER — Ambulatory Visit (INDEPENDENT_AMBULATORY_CARE_PROVIDER_SITE_OTHER): Payer: BLUE CROSS/BLUE SHIELD | Admitting: Internal Medicine

## 2017-03-06 ENCOUNTER — Encounter: Payer: Self-pay | Admitting: Internal Medicine

## 2017-03-06 VITALS — BP 143/84 | HR 85 | Temp 98.7°F | Ht 65.0 in | Wt 159.0 lb

## 2017-03-06 DIAGNOSIS — D508 Other iron deficiency anemias: Secondary | ICD-10-CM

## 2017-03-06 DIAGNOSIS — Z298 Encounter for other specified prophylactic measures: Secondary | ICD-10-CM | POA: Diagnosis not present

## 2017-03-06 DIAGNOSIS — Z113 Encounter for screening for infections with a predominantly sexual mode of transmission: Secondary | ICD-10-CM | POA: Diagnosis not present

## 2017-03-06 DIAGNOSIS — B20 Human immunodeficiency virus [HIV] disease: Secondary | ICD-10-CM | POA: Diagnosis not present

## 2017-03-06 DIAGNOSIS — R945 Abnormal results of liver function studies: Secondary | ICD-10-CM

## 2017-03-06 DIAGNOSIS — R7989 Other specified abnormal findings of blood chemistry: Secondary | ICD-10-CM

## 2017-03-06 DIAGNOSIS — Z23 Encounter for immunization: Secondary | ICD-10-CM

## 2017-03-06 NOTE — Assessment & Plan Note (Signed)
Doing well and CD 4 stable.  rtc 6 months.

## 2017-03-06 NOTE — Assessment & Plan Note (Signed)
This is improving with improved diet.

## 2017-03-06 NOTE — Assessment & Plan Note (Signed)
Will continue with Bactrim prophylaxis and can stop if next CD4 remains 200 or above

## 2017-03-06 NOTE — Assessment & Plan Note (Signed)
This has now resolved.  I suspect it is related to HIV/ MAI disease which is now in good control.

## 2017-03-06 NOTE — Progress Notes (Signed)
   Subjective:    Patient ID: Allison Wilson, female    DOB: [birth year], 36 y.o.   MRN: [id number]  HPI Here for follow up.   No new issues. Continues on Tivicay and descovy along with Bactrim OI prophylaxis.  No missed doses.  CD4 200 and viral load < 20.  No associated n/v/d.  No weight loss.  Eating well and concerned since she is gaining weight.    Review of Systems  Constitutional: Negative for fatigue.  Gastrointestinal: Negative for diarrhea.  Skin: Negative for rash.       Objective:   Physical Exam  Constitutional: She appears well-developed and well-nourished.  HENT:  Mouth/Throat: No oropharyngeal exudate.  Eyes: No scleral icterus.  Cardiovascular: Normal rate, regular rhythm and normal heart sounds.   Lymphadenopathy:    She has no cervical adenopathy.  Skin: No rash noted.   SH: no tobacco       Assessment & Plan:

## 2017-03-06 NOTE — Assessment & Plan Note (Signed)
Screened negative 

## 2017-03-13 MED FILL — TIVICAY 50 MG TABLET: 50 | 30 days supply | Qty: 30 | Fill #1

## 2017-03-13 MED FILL — DESCOVY 200-25 MG TABS: 200-25 | 30 days supply | Qty: 30 | Fill #1

## 2017-03-17 ENCOUNTER — Ambulatory Visit: Payer: BLUE CROSS/BLUE SHIELD

## 2017-04-07 MED FILL — TIVICAY 50 MG TABLET: 50 | 30 days supply | Qty: 30 | Fill #2

## 2017-04-07 MED FILL — DESCOVY 200-25 MG TABS: 200-25 | 30 days supply | Qty: 30 | Fill #2

## 2017-04-10 ENCOUNTER — Ambulatory Visit (INDEPENDENT_AMBULATORY_CARE_PROVIDER_SITE_OTHER): Payer: BLUE CROSS/BLUE SHIELD | Admitting: Infectious Diseases

## 2017-04-10 ENCOUNTER — Other Ambulatory Visit (HOSPITAL_COMMUNITY)
Admission: RE | Admit: 2017-04-10 | Discharge: 2017-04-10 | Disposition: A | Discharge status: Home / Self Care | Payer: BLUE CROSS/BLUE SHIELD | Source: Ambulatory Visit | Attending: Infectious Diseases | Admitting: Infectious Diseases

## 2017-04-10 ENCOUNTER — Encounter: Payer: Self-pay | Admitting: Infectious Diseases

## 2017-04-10 DIAGNOSIS — R87612 Low grade squamous intraepithelial lesion on cytologic smear of cervix (LGSIL): Secondary | ICD-10-CM | POA: Insufficient documentation

## 2017-04-10 DIAGNOSIS — Z01411 Encounter for gynecological examination (general) (routine) with abnormal findings: Secondary | ICD-10-CM | POA: Insufficient documentation

## 2017-04-10 DIAGNOSIS — N879 Dysplasia of cervix uteri, unspecified: Secondary | ICD-10-CM

## 2017-04-10 DIAGNOSIS — B20 Human immunodeficiency virus [HIV] disease: Secondary | ICD-10-CM

## 2017-04-10 DIAGNOSIS — Z124 Encounter for screening for malignant neoplasm of cervix: Secondary | ICD-10-CM | POA: Diagnosis not present

## 2017-04-10 NOTE — Progress Notes (Signed)
     Subjective:   Allison Wilson is a 36 y.o. HIV+ female here for a routine well-woman exam and screening for cervical cancer.   Current complaints: none - specifically denies dyspareunia, burning/painful urination, vaginal itching, discharge or odor.   Past Medical History Past Medical History:  Diagnosis Date  . Hepatitis A   . HIV (human immunodeficiency virus infection) [facility](HCC)    Gynecologic History No obstetric history on file.  Reports she is still having regular periods. Contraception: condoms.  Last Pap: 2012. Results were: abnormal (LSIL + HPV)  Last mammogram: does not meet age criteria for recommended screening  Current Medications Current Outpatient Prescriptions on File Prior to Visit  Medication Sig Dispense Refill  . dolutegravir (TIVICAY) 50 MG tablet Take 1 tablet (50 mg total) by mouth daily. 30 tablet 5  . emtricitabine-tenofovir AF (DESCOVY) 200-25 MG tablet Take 1 tablet by mouth daily. 30 tablet 5  . sulfamethoxazole-trimethoprim (BACTRIM DS,SEPTRA DS) 800-160 MG tablet TAKE 1 TABLET BY MOUTH DAILY. (Patient not taking: Reported on 03/06/2017) 30 tablet PRN   No current facility-administered medications on file prior to visit.     Objective:   Physical Exam  General:  Well developed, well nourished, no acute distress. She is alert and oriented x3.  Pelvic: External genitalia is normal in appearance. The vagina is normal in appearance. The cervix is bulbous and easily visualized. No CMT. Normal expected cervical mucus present. Bimanual exam reveals uterus that is felt to be normal size, shape, and contour.  No adnexal masses or tenderness noted. Psych:  She has a normal mood and affect.   Assessment:   Health Maintenance = Normal pelvic exam. Thin prep pap was obtained and sent for cytology with HPV reflex testing.   Plan:   Return in 1 year for F/U pap unless indicated sooner.  F/U as scheduled with Dr. Luciana Axeomer for ongoing HIV care.   Rexene AlbertsStephanie  Dixon, MSN, NP-C [facility]  04/10/17 9:31 AM

## 2017-04-13 LAB — CYTOLOGY - PAP

## 2017-04-14 ENCOUNTER — Telehealth: Payer: Self-pay | Admitting: *Deleted

## 2017-04-14 DIAGNOSIS — R87612 Low grade squamous intraepithelial lesion on cytologic smear of cervix (LGSIL): Secondary | ICD-10-CM

## 2017-04-14 NOTE — Telephone Encounter (Signed)
[facility] / S.Dixon,NP

## 2017-04-14 NOTE — Telephone Encounter (Signed)
Order signed. Thank you!

## 2017-04-14 NOTE — Telephone Encounter (Signed)
-----   Message from Blanchard KelchStephanie N Dixon, NP sent at 04/14/2017 11:52 AM EDT ----- Abnormal PAP with LSIL. She had this same result back in 2012 and I don't see where she had it followed up. Can we get her referred to GYN for colposcopy please? Thank you.

## 2017-04-21 ENCOUNTER — Encounter: Payer: Self-pay | Admitting: Obstetrics & Gynecology

## 2017-04-24 ENCOUNTER — Encounter: Payer: Self-pay | Admitting: Obstetrics and Gynecology

## 2017-05-12 MED FILL — DESCOVY 200-25 MG TABS: 200-25 | 30 days supply | Qty: 30 | Fill #3

## 2017-05-12 MED FILL — TIVICAY 50 MG TABLET: 50 | 30 days supply | Qty: 30 | Fill #3

## 2017-05-25 ENCOUNTER — Ambulatory Visit: Payer: BLUE CROSS/BLUE SHIELD | Admitting: Obstetrics and Gynecology

## 2017-05-31 ENCOUNTER — Other Ambulatory Visit: Payer: Self-pay | Admitting: Pharmacist

## 2017-06-08 MED FILL — DESCOVY 200-25 MG TABS: 200-25 | 30 days supply | Qty: 30 | Fill #4

## 2017-06-08 MED FILL — TIVICAY 50 MG TABLET: 50 | 30 days supply | Qty: 30 | Fill #4

## 2017-07-03 MED FILL — TIVICAY 50 MG TABLET: 50 | 30 days supply | Qty: 30 | Fill #5

## 2017-07-03 MED FILL — DESCOVY 200-25 MG TABS: 200-25 | 30 days supply | Qty: 30 | Fill #5

## 2017-07-31 ENCOUNTER — Other Ambulatory Visit: Payer: Self-pay | Admitting: *Deleted

## 2017-07-31 DIAGNOSIS — B20 Human immunodeficiency virus [HIV] disease: Secondary | ICD-10-CM

## 2017-07-31 MED ORDER — EMTRICITABINE-TENOFOVIR AF 200-25 MG PO TABS: 1.0000 | ORAL_TABLET | Freq: Every day | ORAL | 5 refills | Status: DC

## 2017-07-31 MED ORDER — DOLUTEGRAVIR SODIUM 50 MG PO TABS: 50.0000 mg | ORAL_TABLET | Freq: Every day | ORAL | 5 refills | Status: DC

## 2017-07-31 MED FILL — TIVICAY 50 MG TABLET: 50 | 30 days supply | Qty: 30 | Fill #0

## 2017-07-31 MED FILL — DESCOVY 200-25 MG TABS: 200-25 | 30 days supply | Qty: 30 | Fill #0

## 2017-07-31 NOTE — Telephone Encounter (Signed)
refill 

## 2017-08-25 MED FILL — DESCOVY 200-25 MG TABS: 200-25 | 30 days supply | Qty: 30 | Fill #1

## 2017-08-25 MED FILL — TIVICAY 50 MG TABLET: 50 | 30 days supply | Qty: 30 | Fill #1

## 2017-08-28 ENCOUNTER — Other Ambulatory Visit: Payer: BLUE CROSS/BLUE SHIELD

## 2017-08-28 DIAGNOSIS — B20 Human immunodeficiency virus [HIV] disease: Secondary | ICD-10-CM

## 2017-08-29 LAB — T-HELPER CELL (CD4) - (RCID CLINIC ONLY)
CD4 T CELL ABS: 220 /uL — AB (ref 400–2700)
CD4 T CELL HELPER: 19 % — AB (ref 33–55)

## 2017-08-30 LAB — HIV-1 RNA QUANT-NO REFLEX-BLD
HIV 1 RNA QUANT: NOT DETECTED {copies}/mL
HIV-1 RNA Quant, Log: <1.3 Log copies/mL

## 2017-08-31 ENCOUNTER — Other Ambulatory Visit: Payer: BLUE CROSS/BLUE SHIELD

## 2017-09-11 ENCOUNTER — Ambulatory Visit: Payer: BLUE CROSS/BLUE SHIELD | Admitting: Internal Medicine

## 2017-09-22 MED FILL — TIVICAY 50 MG TABLET: 50 | 30 days supply | Qty: 30 | Fill #2

## 2017-09-22 MED FILL — DESCOVY 200-25 MG TABS: 200-25 | 30 days supply | Qty: 30 | Fill #2

## 2017-09-25 ENCOUNTER — Ambulatory Visit (INDEPENDENT_AMBULATORY_CARE_PROVIDER_SITE_OTHER): Payer: BLUE CROSS/BLUE SHIELD | Admitting: Internal Medicine

## 2017-09-25 ENCOUNTER — Encounter: Payer: Self-pay | Admitting: Internal Medicine

## 2017-09-25 VITALS — BP 146/99 | HR 90 | Temp 98.8°F | Ht 64.0 in | Wt 168.0 lb

## 2017-09-25 DIAGNOSIS — Z298 Encounter for other specified prophylactic measures: Secondary | ICD-10-CM | POA: Diagnosis not present

## 2017-09-25 DIAGNOSIS — B20 Human immunodeficiency virus [HIV] disease: Secondary | ICD-10-CM | POA: Diagnosis not present

## 2017-09-25 DIAGNOSIS — Z113 Encounter for screening for infections with a predominantly sexual mode of transmission: Secondary | ICD-10-CM | POA: Diagnosis not present

## 2017-09-25 MED ORDER — LISINOPRIL 20 MG PO TABS: 20.0000 mg | ORAL_TABLET | Freq: Every day | ORAL | 0 refills | Status: DC

## 2017-09-25 MED ORDER — VITAMIN D (ERGOCALCIFEROL) 1.25 MG (50000 UNIT) PO CAPS: 50000.0000 [IU] | ORAL_CAPSULE | ORAL | 0 refills | Status: DC

## 2017-09-25 NOTE — Assessment & Plan Note (Signed)
Doing well. rtc 6 months unless concerns.

## 2017-09-25 NOTE — Assessment & Plan Note (Signed)
Stable CD4 count so can stop bactrim prophylaxis.

## 2017-09-25 NOTE — Addendum Note (Signed)
Addended by: Andree CossHOWELL, Arya Luttrull M on: 09/25/2017 05:19 PM   Modules accepted: Orders

## 2017-09-25 NOTE — Progress Notes (Signed)
   Subjective:    Patient ID: Allison Wilson, female    DOB: 1981-01-03, 36 y.o.   MRN: [id number]  HPI Here for follow up.   No new issues. Continues on Tivicay and descovy along with Bactrim OI prophylaxis.  No missed doses.  CD4 220 and viral load < 20.  No associated n/v/d.  No weight loss.  Started a bp med and Vitamin D.     Review of Systems  Constitutional: Negative for fatigue.  Gastrointestinal: Negative for diarrhea.  Skin: Negative for rash.       Objective:   Physical Exam  Constitutional: She appears well-developed and well-nourished.  HENT:  Mouth/Throat: No oropharyngeal exudate.  Eyes: No scleral icterus.  Cardiovascular: Normal rate, regular rhythm and normal heart sounds.  Lymphadenopathy:    She has no cervical adenopathy.  Skin: No rash noted.   SH: no tobacco       Assessment & Plan:

## 2017-10-18 MED FILL — DESCOVY 200-25 MG TABS: 200-25 | 30 days supply | Qty: 30 | Fill #3

## 2017-10-18 MED FILL — TIVICAY 50 MG TABLET: 50 | 30 days supply | Qty: 30 | Fill #3

## 2017-11-14 MED FILL — DESCOVY 200-25 MG TABS: 200-25 | 30 days supply | Qty: 30 | Fill #4

## 2017-11-14 MED FILL — TIVICAY 50 MG TABLET: 50 | 30 days supply | Qty: 30 | Fill #4

## 2017-11-24 ENCOUNTER — Other Ambulatory Visit: Payer: Self-pay | Admitting: Pharmacist

## 2017-12-11 MED FILL — TIVICAY 50 MG TABLET: 50 | 30 days supply | Qty: 30 | Fill #5

## 2017-12-11 MED FILL — DESCOVY 200-25 MG TABS: 200-25 | 30 days supply | Qty: 30 | Fill #5

## 2018-01-04 ENCOUNTER — Other Ambulatory Visit: Payer: Self-pay | Admitting: Internal Medicine

## 2018-01-04 DIAGNOSIS — B20 Human immunodeficiency virus [HIV] disease: Secondary | ICD-10-CM

## 2018-01-05 MED FILL — DESCOVY 200-25 MG TABS: 200-25 | 30 days supply | Qty: 30 | Fill #0

## 2018-01-05 MED FILL — TIVICAY 50 MG TABLET: 50 | 30 days supply | Qty: 30 | Fill #0

## 2018-01-31 MED FILL — DESCOVY 200-25 MG TABS: 200-25 | 30 days supply | Qty: 30 | Fill #1

## 2018-01-31 MED FILL — TIVICAY 50 MG TABLET: 50 | 30 days supply | Qty: 30 | Fill #1

## 2018-02-26 MED FILL — TIVICAY 50 MG TABLET: 50 | 30 days supply | Qty: 30 | Fill #2

## 2018-02-26 MED FILL — DESCOVY 200-25 MG TABS: 200-25 | 30 days supply | Qty: 30 | Fill #2

## 2018-03-23 MED FILL — DESCOVY 200-25 MG TABS: 200-25 | 30 days supply | Qty: 30 | Fill #3

## 2018-03-23 MED FILL — TIVICAY 50 MG TABLET: 50 | 30 days supply | Qty: 30 | Fill #3

## 2018-03-26 ENCOUNTER — Other Ambulatory Visit: Payer: BLUE CROSS/BLUE SHIELD

## 2018-04-09 ENCOUNTER — Encounter: Payer: Self-pay | Admitting: Internal Medicine

## 2018-04-09 ENCOUNTER — Ambulatory Visit (INDEPENDENT_AMBULATORY_CARE_PROVIDER_SITE_OTHER): Payer: BLUE CROSS/BLUE SHIELD | Admitting: Internal Medicine

## 2018-04-09 ENCOUNTER — Other Ambulatory Visit (HOSPITAL_COMMUNITY)
Admission: RE | Admit: 2018-04-09 | Discharge: 2018-04-09 | Disposition: A | Discharge status: Home / Self Care | Payer: BLUE CROSS/BLUE SHIELD | Source: Ambulatory Visit | Attending: Internal Medicine | Admitting: Internal Medicine

## 2018-04-09 VITALS — BP 156/96 | HR 73 | Temp 98.6°F | Ht 64.0 in | Wt 169.0 lb

## 2018-04-09 DIAGNOSIS — B20 Human immunodeficiency virus [HIV] disease: Secondary | ICD-10-CM | POA: Diagnosis present

## 2018-04-09 DIAGNOSIS — Z113 Encounter for screening for infections with a predominantly sexual mode of transmission: Secondary | ICD-10-CM | POA: Diagnosis not present

## 2018-04-09 DIAGNOSIS — R87612 Low grade squamous intraepithelial lesion on cytologic smear of cervix (LGSIL): Secondary | ICD-10-CM

## 2018-04-09 DIAGNOSIS — Z298 Encounter for other specified prophylactic measures: Secondary | ICD-10-CM | POA: Diagnosis not present

## 2018-04-09 DIAGNOSIS — Z79899 Other long term (current) drug therapy: Secondary | ICD-10-CM | POA: Diagnosis not present

## 2018-04-09 NOTE — Assessment & Plan Note (Signed)
Will screen today 

## 2018-04-09 NOTE — Assessment & Plan Note (Signed)
Doing well.  Labs today and rtc 6 months.  

## 2018-04-09 NOTE — Assessment & Plan Note (Signed)
I will refer her back to gynecology.

## 2018-04-09 NOTE — Progress Notes (Signed)
   Subjective:    Patient ID: Allison Wilson, female    DOB: May 12, 1981, 37 y.o.   MRN: [id number]  HPI Here for follow up of HIV She continues on Tivicay and Descovy and denies any missed doses.  Last CD4 of 220 and viral load < 20.  No new complaints. She did have a PAP last year and was supposed to see gynecology for ? Colposcopy but did not get an appointment.  No assocated rash, diarrhea.    Review of Systems  Constitutional:       Some fatigue she attributes to the medication  Gastrointestinal: Negative for diarrhea.  Skin: Negative for rash.       Objective:   Physical Exam  Constitutional: She appears well-developed and well-nourished.  Eyes: No scleral icterus.  Cardiovascular: Normal rate, regular rhythm and normal heart sounds.  No murmur heard. Pulmonary/Chest: Effort normal and breath sounds normal. No respiratory distress.  Lymphadenopathy:    She has no cervical adenopathy.  Skin: No rash noted.   SH: no tobacco       Assessment & Plan:

## 2018-04-09 NOTE — Assessment & Plan Note (Signed)
If her CD4 count is again over 200, she can stop Bactrim prophylaxis

## 2018-04-10 ENCOUNTER — Telehealth: Payer: Self-pay | Admitting: Obstetrics and Gynecology

## 2018-04-10 LAB — CBC WITH DIFFERENTIAL/PLATELET
BASOS ABS: 29 {cells}/uL (ref 0–200)
Basophils Relative: 0.7 %
EOS ABS: 127 {cells}/uL (ref 15–500)
Eosinophils Relative: 3.1 %
HCT: 31.8 % — ABNORMAL LOW (ref 35.0–45.0)
HEMOGLOBIN: 10.8 g/dL — AB (ref 11.7–15.5)
Lymphs Abs: 1201 cells/uL (ref 850–3900)
MCH: 29.1 pg (ref 27.0–33.0)
MCHC: 34 g/dL (ref 32.0–36.0)
MCV: 85.7 fL (ref 80.0–100.0)
MONOS PCT: 13.8 %
MPV: 10.2 fL (ref 7.5–12.5)
NEUTROS ABS: 2177 {cells}/uL (ref 1500–7800)
Neutrophils Relative %: 53.1 %
Platelets: 219 10*3/uL (ref 140–400)
RBC: 3.71 10*6/uL — ABNORMAL LOW (ref 3.80–5.10)
RDW: 12.8 % (ref 11.0–15.0)
Total Lymphocyte: 29.3 %
WBC mixed population: 566 cells/uL (ref 200–950)
WBC: 4.1 10*3/uL (ref 3.8–10.8)

## 2018-04-10 LAB — COMPLETE METABOLIC PANEL WITH GFR
AG RATIO: 0.9 (calc) — AB (ref 1.0–2.5)
ALBUMIN MSPROF: 3.9 g/dL (ref 3.6–5.1)
ALT: 9 U/L (ref 6–29)
AST: 15 U/L (ref 10–30)
Alkaline phosphatase (APISO): 52 U/L (ref 33–115)
BUN / CREAT RATIO: 13 (calc) (ref 6–22)
BUN: 17 mg/dL (ref 7–25)
CALCIUM: 9.3 mg/dL (ref 8.6–10.2)
CO2: 24 mmol/L (ref 20–32)
Chloride: 104 mmol/L (ref 98–110)
Creat: 1.36 mg/dL — ABNORMAL HIGH (ref 0.50–1.10)
GFR, EST AFRICAN AMERICAN: 57 mL/min/{1.73_m2} — AB (ref >=60)
GFR, EST NON AFRICAN AMERICAN: 50 mL/min/{1.73_m2} — AB (ref >=60)
GLOBULIN: 4.3 g/dL — AB (ref 1.9–3.7)
Glucose, Bld: 90 mg/dL (ref 65–99)
POTASSIUM: 3.7 mmol/L (ref 3.5–5.3)
SODIUM: 135 mmol/L (ref 135–146)
TOTAL PROTEIN: 8.2 g/dL — AB (ref 6.1–8.1)
Total Bilirubin: 0.2 mg/dL (ref 0.2–1.2)

## 2018-04-10 LAB — LIPID PANEL
CHOL/HDL RATIO: 3.4 (calc) (ref <5.0)
CHOLESTEROL: 205 mg/dL — AB (ref <200)
HDL: 61 mg/dL (ref >50)
LDL Cholesterol (Calc): 128 mg/dL (calc) — ABNORMAL HIGH
Non-HDL Cholesterol (Calc): 144 mg/dL (calc) — ABNORMAL HIGH (ref <130)
Triglycerides: 67 mg/dL (ref <150)

## 2018-04-10 LAB — RPR: RPR Ser Ql: NONREACTIVE

## 2018-04-10 NOTE — Telephone Encounter (Signed)
Called and left a message for patient to call back to schedule a new patient doctor referral appointment with our office to see Dr. Jertson. °

## 2018-04-11 LAB — T-HELPER CELL (CD4) - (RCID CLINIC ONLY)
CD4 % Helper T Cell: 23 % — ABNORMAL LOW (ref 33–55)
CD4 T Cell Abs: 300 /uL — ABNORMAL LOW (ref 400–2700)

## 2018-04-11 LAB — URINE CYTOLOGY ANCILLARY ONLY
Chlamydia: NEGATIVE
Neisseria Gonorrhea: NEGATIVE

## 2018-04-11 NOTE — Telephone Encounter (Signed)
[place]ed a call to [facility].

## 2018-04-12 LAB — HIV-1 RNA QUANT-NO REFLEX-BLD
HIV 1 RNA Quant: <20 copies/mL — AB
HIV-1 RNA QUANT, LOG: DETECTED {Log_copies}/mL — AB

## 2018-04-23 ENCOUNTER — Telehealth: Payer: Self-pay | Admitting: Pharmacist

## 2018-04-23 NOTE — Telephone Encounter (Signed)
Agree, thanks

## 2018-04-23 NOTE — Telephone Encounter (Signed)
[facility] Kathie RhodesBetty tried to run medications through [employer] insurance and it said her insurance had terminated.  I called her husband, Sherilyn CooterHenry, to see what was going on and had to leave a message.  If her insurance has indeed terminated (which it looks like it has), I will get her advancing access for Biktarvy if she is agreeable to switch.  I will also plug her in to see [facility]leg[facility]Kathy [facility].

## 2018-05-01 MED FILL — DESCOVY 200-25 MG TABS: 200-25 | 30 days supply | Qty: 30 | Fill #4

## 2018-05-01 MED FILL — TIVICAY 50 MG TABLET: 50 | 30 days supply | Qty: 30 | Fill #4

## 2018-05-01 NOTE — Telephone Encounter (Signed)
New insurance found and updated. All good.

## 2018-05-04 ENCOUNTER — Other Ambulatory Visit (HOSPITAL_COMMUNITY)
Admission: RE | Admit: 2018-05-04 | Discharge: 2018-05-04 | Disposition: A | Discharge status: Home / Self Care | Payer: BLUE CROSS/BLUE SHIELD | Source: Ambulatory Visit | Attending: Obstetrics and Gynecology | Admitting: Obstetrics and Gynecology

## 2018-05-04 ENCOUNTER — Other Ambulatory Visit: Payer: Self-pay

## 2018-05-04 ENCOUNTER — Encounter: Payer: Self-pay | Admitting: Obstetrics and Gynecology

## 2018-05-04 ENCOUNTER — Ambulatory Visit: Payer: BLUE CROSS/BLUE SHIELD | Admitting: Obstetrics and Gynecology

## 2018-05-04 VITALS — BP 126/82 | HR 80 | Resp 14 | Ht 65.0 in | Wt 168.4 lb

## 2018-05-04 DIAGNOSIS — Z21 Asymptomatic human immunodeficiency virus [HIV] infection status: Secondary | ICD-10-CM

## 2018-05-04 DIAGNOSIS — Z01419 Encounter for gynecological examination (general) (routine) without abnormal findings: Secondary | ICD-10-CM

## 2018-05-04 DIAGNOSIS — Z124 Encounter for screening for malignant neoplasm of cervix: Secondary | ICD-10-CM | POA: Diagnosis not present

## 2018-05-04 DIAGNOSIS — R87612 Low grade squamous intraepithelial lesion on cytologic smear of cervix (LGSIL): Secondary | ICD-10-CM

## 2018-05-04 NOTE — Patient Instructions (Addendum)
;Colposcopy Colposcopy is a procedure to examine the lowest part of the uterus (cervix), the vagina, and the area around the vaginal opening (vulva) for abnormalities or signs of disease. The procedure is done using a lighted microscope or magnifying lens (colposcope). If any unusual cells are found during the procedure, your health care provider may remove a tissue sample for testing (biopsy). A colposcopy may be done if you:  Have an abnormal Pap test. A Pap test is a screening test that is used to check for signs of cancer or infection of the vagina, cervix, and uterus.  Have a Pap smear test in which you test positive for high-risk HPV (human papillomavirus).  Have a sore or lesion on your cervix.  Have genital warts on your vulva, vagina, or cervix.  Took certain medicines while pregnant, such as diethylstilbestrol (DES).  Have pain during sexual intercourse.  Have vaginal bleeding, especially after sexual intercourse.  Need to have a cervical polyp removed.  Need to have a lost intrauterine device (IUD) string located.  Let your health care provider know about:  Any allergies you have, including allergies to prescribed medicine, latex, or iodine.  All medicines you are taking, including vitamins, herbs, eye drops, creams, and over-the-counter medicines. Bring a list of all of your medicines to your appointment.  Any problems you or family members have had with anesthetic medicines.  Any blood disorders you have.  Any surgeries you have had.  Any medical conditions you have, such as pelvic inflammatory disease (PID) or endometrial disorder.  Any history of frequent fainting.  Your menstrual cycle and what form of birth control (contraception) you use.  Your medical history, including any prior cervical treatment.  Whether you are pregnant or may be pregnant. What are the risks? Generally, this is a safe procedure. However, problems may occur,  including:  Pain.  Infection, which may include a fever, bad-smelling discharge, or pelvic pain.  Bleeding or discharge.  Misdiagnosis.  Fainting and vasovagal reactions, but this is rare.  Allergic reactions to medicines.  Damage to other structures or organs.  What happens before the procedure?  If you have your menstrual period or will have it at the time of your procedure, tell your health care provider. A colposcopy typically is not done during menstruation.  Continue your contraceptive practices before and after the procedure.  For 24 hours before the colposcopy: ? Do not douche. ? Do not use tampons. ? Do not use medicines, creams, or suppositories in the vagina. ? Do not have sexual intercourse.  Ask your health care provider about: ? Changing or stopping your regular medicines. This is especially important if you are taking diabetes medicines or blood thinners. ? Taking medicines such as aspirin and ibuprofen. These medicines can thin your blood. Do not take these medicines before your procedure if your health care provider instructs you not to. It is likely that your health care provider will tell you to avoid taking aspirin or medicine that contains aspirin for 7 days before the procedure.  Follow instructions from your health care provider about eating or drinking restrictions. You will likely need to eat a regular diet the day of the procedure and not skip any meals.  You may have an exam or testing. A pregnancy test will be taken on the day of the procedure.  You may have a blood or urine sample taken.  Plan to have someone take you home from the hospital or clinic.  If you will  be going home right after the procedure, plan to have someone with you for 24 hours. What happens during the procedure?  You will lie down on your back, with your feet in foot rests (stirrups).  A warmed and lubricated instrument (speculum) will be inserted into your vagina. The  speculum will be used to hold apart the walls of your vagina so your health care provider can see your cervix and the inside of your vagina.  A cotton swab will be used to place a small amount of liquid solution on the areas to be examined. This solution makes it easier to see abnormal cells. You may feel a slight burning during this part.  The colposcope will be used to scan the cervix with a bright white light. The colposcope will be held near your vulvaand will magnify your vulva, vagina, and cervix for easier examination.  Your health care provider may decide to take a biopsy. If so: ? You may be given medicine to numb the area (local anesthetic). ? Surgical instruments will be used to suck out mucus and cells through your vagina. ? You may feel mild pain while the tissue sample is removed. ? Bleeding may occur. A solution may be used to stop the bleeding. ? If a sample of tissue is needed from the inside of the cervix, a different procedure called endocervical curettage (ECC) may be completed. During this procedure, a curved instrument (curette) will be used to scrape cells from your cervix or the top of your cervix (endocervix).  Your health care provider will record the location of any abnormalities. The procedure may vary among health care providers and hospitals. What happens after the procedure?  You will lie down and rest for a few minutes. You may be offered juice or cookies.  Your blood pressure, heart rate, breathing rate, and blood oxygen level will be monitored until any medicines you were given have worn off.  You may have to wear compression stockings. These stockings help to prevent blood clots and reduce swelling in your legs.  You may have some cramping in your abdomen. This should go away after a few minutes. This information is not intended to replace advice given to you by your health care provider. Make sure you discuss any questions you have with your health care  provider. Document Released: 12/31/2002 Document Revised: 06/07/2016 Document Reviewed: 05/16/2016 Elsevier Interactive Patient Education  2018 [employer]. Medroxyprogesterone injection [Contraceptive] What is this medicine? MEDROXYPROGESTERONE (me DROX ee proe JES te rone) contraceptive injections prevent pregnancy. They provide effective birth control for 3 months. Depo-subQ Provera 104 is also used for treating pain related to endometriosis. This medicine may be used for other purposes; ask your health care provider or pharmacist if you have questions. COMMON BRAND NAME(S): Depo-Provera, Depo-subQ Provera 104 What should I tell my health care provider before I take this medicine? They need to know if you have any of these conditions: -frequently drink alcohol -asthma -blood vessel disease or a history of a blood clot in the lungs or legs -bone disease such as osteoporosis -breast cancer -diabetes -eating disorder (anorexia nervosa or bulimia) -high blood pressure -HIV infection or AIDS -kidney disease -liver disease -mental depression -migraine -seizures (convulsions) -stroke -tobacco smoker -vaginal bleeding -an unusual or allergic reaction to medroxyprogesterone, other hormones, medicines, foods, dyes, or preservatives -pregnant or trying to get pregnant -breast-feeding How should I use this medicine? Depo-Provera Contraceptive injection is given into a muscle. Depo-subQ Provera 104 injection is given  under the skin. These injections are given by a health care professional. You must not be pregnant before getting an injection. The injection is usually given during the first 5 days after the start of a menstrual period or 6 weeks after delivery of a baby. Talk to your pediatrician regarding the use of this medicine in children. Special care may be needed. These injections have been used in female children who have started having menstrual periods. Overdosage: If you think you  have taken too much of this medicine contact a poison control center or emergency room at once. NOTE: This medicine is only for you. Do not share this medicine with others. What if I miss a dose? Try not to miss a dose. You must get an injection once every 3 months to maintain birth control. If you cannot keep an appointment, call and reschedule it. If you wait longer than 13 weeks between Depo-Provera contraceptive injections or longer than 14 weeks between Depo-subQ Provera 104 injections, you could get pregnant. Use another method for birth control if you miss your appointment. You may also need a pregnancy test before receiving another injection. What may interact with this medicine? Do not take this medicine with any of the following medications: -bosentan This medicine may also interact with the following medications: -aminoglutethimide -antibiotics or medicines for infections, especially rifampin, rifabutin, rifapentine, and griseofulvin -aprepitant -barbiturate medicines such as phenobarbital or primidone -bexarotene -carbamazepine -medicines for seizures like ethotoin, felbamate, oxcarbazepine, phenytoin, topiramate -modafinil -St. John's wort This list may not describe all possible interactions. Give your health care provider a list of all the medicines, herbs, non-prescription drugs, or dietary supplements you use. Also tell them if you smoke, drink alcohol, or use illegal drugs. Some items may interact with your medicine. What should I watch for while using this medicine? This drug does not protect you against HIV infection (AIDS) or other sexually transmitted diseases. Use of this product may cause you to lose calcium from your bones. Loss of calcium may cause weak bones (osteoporosis). Only use this product for more than 2 years if other forms of birth control are not right for you. The longer you use this product for birth control the more likely you will be at risk for weak bones.  Ask your health care professional how you can keep strong bones. You may have a change in bleeding pattern or irregular periods. Many females stop having periods while taking this drug. If you have received your injections on time, your chance of being pregnant is very low. If you think you may be pregnant, see your health care professional as soon as possible. Tell your health care professional if you want to get pregnant within the next year. The effect of this medicine may last a long time after you get your last injection. What side effects may I notice from receiving this medicine? Side effects that you should report to your doctor or health care professional as soon as possible: -allergic reactions like skin rash, itching or hives, swelling of the face, lips, or tongue -breast tenderness or discharge -breathing problems -changes in vision -depression -feeling faint or lightheaded, falls -fever -pain in the abdomen, chest, groin, or leg -problems with balance, talking, walking -unusually weak or tired -yellowing of the eyes or skin Side effects that usually do not require medical attention (report to your doctor or health care professional if they continue or are bothersome): -acne -fluid retention and swelling -headache -irregular periods, spotting, or absent periods -  temporary pain, itching, or skin reaction at site where injected -weight gain This list may not describe all possible side effects. Call your doctor for medical advice about side effects. You may report side effects to FDA at 1-800-FDA-1088. Where should I keep my medicine? This does not apply. The injection will be given to you by a health care professional. NOTE: This sheet is a summary. It may not cover all possible information. If you have questions about this medicine, talk to your doctor, pharmacist, or health care provider.  2018 Elsevier/Gold Standard (2008-10-31 18:37:56)

## 2018-05-04 NOTE — Progress Notes (Signed)
37 y.o. G1P1 MarriedAfrican AmericanF here for new patient consult referral from Dr. Luciana Axe for LGSIL pap smear from 04/10/17. Referral was placed last year and again now. The patient is HIV +.   Period Cycle (Days): 30 Period Duration (Days): 3-4  Period Pattern: Regular Menstrual Flow: Light Menstrual Control: Thin pad Dysmenorrhea: (!) Mild Dysmenorrhea Symptoms: Cramping  Changes her pad in up to 45 minutes at times, not full. States she could wear a pad all day long, just doesn't like how it feels.  Sexually active, using condoms. No pain.    Patient's last menstrual period was 04/15/2018.          Sexually active: Yes.    The current method of family planning is none.    Exercising: Yes.    walking Smoker:  no  Health Maintenance: Pap:  04-10-17 LGSIL, 2012 LSIL History of abnormal Pap:  yes TDaP:  unsure Gardasil: completed   reports that she has never smoked. She has never used smokeless tobacco. She reports that she does not drink alcohol or use drugs. Daughter is 97. She works in housekeeping  Past Medical History:  Diagnosis Date  . Anemia   . Hepatitis A   . HIV (human immunodeficiency virus infection) (HCC)   . Hypertension     History reviewed. No pertinent surgical history.  Current Outpatient Medications  Medication Sig Dispense Refill  . DESCOVY 200-25 MG tablet TAKE 1 TABLET BY MOUTH DAILY. 30 tablet 5  . ferrous sulfate 325 (65 FE) MG tablet Take 1 tablet by mouth daily.    Marland Kitchen lisinopril (PRINIVIL,ZESTRIL) 20 MG tablet Take 20 mg by mouth daily.    Marland Kitchen TIVICAY 50 MG tablet TAKE 1 TABLET (50 MG TOTAL) BY MOUTH DAILY. 30 tablet 5  . Vitamin D, Ergocalciferol, (DRISDOL) 50000 units CAPS capsule Take 1 capsule (50,000 Units total) by mouth every 7 (seven) days. 30 capsule 0  . carvedilol (COREG) 6.25 MG tablet     . lisinopril (PRINIVIL,ZESTRIL) 30 MG tablet lisinopril 30 mg tablet     No current facility-administered medications for this visit.     History  reviewed. No pertinent family history.  Review of Systems  Constitutional: Negative.   HENT: Negative.   Eyes: Negative.   Respiratory: Negative.   Cardiovascular: Negative.   Gastrointestinal: Negative.   Endocrine: Negative.   Genitourinary: Negative.   Musculoskeletal: Negative.   Skin: Negative.   Allergic/Immunologic: Negative.   Neurological: Negative.   Hematological: Negative.   Psychiatric/Behavioral: Negative.     Exam:   BP 126/82 (BP Location: Right Arm, Patient Position: Sitting, Cuff Size: Normal)   Pulse 80   Resp 14   Ht 5\' 5"  (1.651 m)   Wt 168 lb 6.4 oz (76.4 kg)   LMP 04/15/2018   BMI 28.02 kg/m   Weight change: @WEIGHTCHANGE @ Height:   Height: 5\' 5"  (165.1 cm)  Ht Readings from Last 3 Encounters:  05/04/18 5\' 5"  (1.651 m)  04/09/18 5\' 4"  (1.626 m)  09/25/17 5\' 4"  (1.626 m)    General appearance: alert, cooperative and appears stated age Head: Normocephalic, without obvious abnormality, atraumatic Neck: no adenopathy, supple, symmetrical, trachea midline and thyroid normal to inspection and palpation Lungs: clear to auscultation bilaterally Cardiovascular: regular rate and rhythm Breasts: normal appearance, no masses or tenderness Abdomen: soft, non-tender; non distended,  no masses,  no organomegaly Extremities: extremities normal, atraumatic, no cyanosis or edema Skin: Skin color, texture, turgor normal. No rashes or lesions Lymph nodes: Cervical,  supraclavicular, and axillary nodes normal. No abnormal inguinal nodes palpated Neurologic: Grossly normal   Pelvic: External genitalia:  no lesions              Urethra:  normal appearing urethra with no masses, tenderness or lesions              Bartholins and Skenes: normal                 Vagina: normal appearing vagina with normal color and discharge, no lesions              Cervix: no lesions               Bimanual Exam:  Uterus:  normal size, contour, position, consistency, mobility,  non-tender              Adnexa: no mass, fullness, tenderness               Rectovaginal: Confirms               Anus:  normal sphincter tone, no lesions  Chaperone was present for exam.  A:  H/O LSIL pap in 6/18, never went for colposcopy  Overdue for gyn exam  Well woman exam done  HIV +  Contraception, using condoms, discussed the option of adding an additional form of contraception    P:   Pap with hpv  Will return for colposcopy even if this pap has improved  Discussed breast self exam  Discussed options for contraception, including the mini-pill, depo-provera, nexaplanon and a mirena IUD. Information given.   CC: Dr Staci Righterobert Comer,

## 2018-05-07 LAB — CYTOLOGY - PAP
Diagnosis: NEGATIVE
HPV: NOT DETECTED

## 2018-05-10 ENCOUNTER — Telehealth: Payer: Self-pay | Admitting: Obstetrics and Gynecology

## 2018-05-10 NOTE — Telephone Encounter (Signed)
Returning a call to patient. Left message for patient to call me back.

## 2018-05-10 NOTE — Telephone Encounter (Signed)
[name] Ph: [phone].

## 2018-05-17 ENCOUNTER — Telehealth: Payer: Self-pay | Admitting: Emergency Medicine

## 2018-05-17 NOTE — Telephone Encounter (Signed)
-----   Message from Romualdo BolkJill Evelyn Jertson, MD sent at 05/14/2018 12:55 PM EDT ----- Please let the patient know that her pap is currently negative with negative HPV. I would hold on the colposcopy for now (LSIL last year, never did the recommended colposcopy). Given her +HIV status, I would recommend that she return for a pap and HPV in 6 months.  Please cancel the ordered colposcopy. CC: Dr. Luciana Axeomer

## 2018-05-18 NOTE — Telephone Encounter (Signed)
Patient returned call to Tracy. °

## 2018-05-18 NOTE — Telephone Encounter (Signed)
Left message to call Harlow Basley at [phone].  

## 2018-05-30 MED FILL — TIVICAY 50 MG TABLET: 50 | 30 days supply | Qty: 30 | Fill #5

## 2018-05-30 MED FILL — DESCOVY 200-25 MG TABS: 200-25 | 30 days supply | Qty: 30 | Fill #5

## 2018-06-05 NOTE — Telephone Encounter (Signed)
Message left to return call to Curley Hogen at [phone].    

## 2018-06-11 NOTE — Telephone Encounter (Signed)
Message left to return call to Smoke Riseracy at [phone].   Colposcopy cancelled per Dr. Oscar LaJertson.  Needs to schedule repeat pap only in 6 months for January 2020.

## 2018-06-11 NOTE — Telephone Encounter (Signed)
Message left to return call to Ingris Pasquarella at [phone].    

## 2018-06-11 NOTE — Telephone Encounter (Signed)
We did discuss depo-provera at her last visit. If she is having regular cycles, is on her cycle and has a negative UPT (in office), she can start the depo-provera now (nurse visit only). She needs to use back up contraception for one week. She should continue to use condoms for STD protection and to protect her partner (she is HIV +)

## 2018-06-11 NOTE — Telephone Encounter (Signed)
Patient is returning a call to Tracy. °

## 2018-06-11 NOTE — Telephone Encounter (Signed)
Spoke with patient and advised of message from Dr. Oscar LaJertson.  Pt is requesting to start Depo Provera for contraception. They discussed at last office visit.  Advised patient will send message to Dr. Oscar LaJertson to see if okay to start Depo Provera injections now or needs office visit to discuss further.   Pt agreeable.  6 month follow up pap smear appointment made.

## 2018-06-11 NOTE — Telephone Encounter (Signed)
Spoke with patient. On menses now, would like to start Depo this week.  Office visit for tomorrow 06/12/18 for nurse.  Needs UPT prior.  Encounter closed.

## 2018-06-11 NOTE — Telephone Encounter (Signed)
Patient's period started Saturday and she's calling to schedule a procedure.

## 2018-06-12 ENCOUNTER — Ambulatory Visit (INDEPENDENT_AMBULATORY_CARE_PROVIDER_SITE_OTHER): Payer: BLUE CROSS/BLUE SHIELD

## 2018-06-12 VITALS — BP 130/76 | HR 66 | Resp 14 | Ht 64.0 in | Wt 171.0 lb

## 2018-06-12 DIAGNOSIS — Z30013 Encounter for initial prescription of injectable contraceptive: Secondary | ICD-10-CM | POA: Diagnosis not present

## 2018-06-12 MED ORDER — MEDROXYPROGESTERONE ACETATE 150 MG/ML IM SUSP
150.0000 mg | Freq: Once | INTRAMUSCULAR | Status: AC
  Administered 2018-06-12: 150 mg via INTRAMUSCULAR

## 2018-06-12 NOTE — Progress Notes (Signed)
Patient is here for Depo Provera Injection Patient is within Depo Provera Calender Limits First Dep  Next Depo Due between: Nov 5- Nov 19 Last AEX: 05/04/18 AEX Scheduled: 11/11/18  Patient is aware when next depo is due  Pt tolerated Injection well. RUOQ  Routed to provider for review, encounter closed.

## 2018-06-20 ENCOUNTER — Other Ambulatory Visit: Payer: Self-pay | Admitting: Internal Medicine

## 2018-06-20 DIAGNOSIS — B20 Human immunodeficiency virus [HIV] disease: Secondary | ICD-10-CM

## 2018-06-26 MED FILL — DESCOVY 200-25 MG TABS: 200-25 | 30 days supply | Qty: 30 | Fill #0

## 2018-06-26 MED FILL — TIVICAY 50 MG TABLET: 50 | 30 days supply | Qty: 30 | Fill #0

## 2018-07-25 MED FILL — DESCOVY 200-25 MG TABS: 200-25 | 30 days supply | Qty: 30 | Fill #1

## 2018-07-25 MED FILL — TIVICAY 50 MG TABLET: 50 | 30 days supply | Qty: 30 | Fill #1

## 2018-08-20 MED FILL — TIVICAY 50 MG TABLET: 50 | 30 days supply | Qty: 30 | Fill #2

## 2018-08-20 MED FILL — DESCOVY 200-25 MG TABS: 200-25 | 30 days supply | Qty: 30 | Fill #2

## 2018-08-28 ENCOUNTER — Ambulatory Visit (INDEPENDENT_AMBULATORY_CARE_PROVIDER_SITE_OTHER): Payer: BLUE CROSS/BLUE SHIELD

## 2018-08-28 VITALS — BP 118/62 | HR 72 | Resp 16 | Ht 65.0 in | Wt 168.4 lb

## 2018-08-28 DIAGNOSIS — Z3042 Encounter for surveillance of injectable contraceptive: Secondary | ICD-10-CM | POA: Diagnosis not present

## 2018-08-28 MED ORDER — MEDROXYPROGESTERONE ACETATE 150 MG/ML IM SUSP
150.0000 mg | Freq: Once | INTRAMUSCULAR | Status: AC
  Administered 2018-08-28: 150 mg via INTRAMUSCULAR

## 2018-08-28 NOTE — Progress Notes (Signed)
Patient is here for Depo Provera Injection Patient is within Depo Provera Calender Limits last given 06/12/18 Next Depo Due between: Jan 21- Feb 4 Last OV: 05/04/18 AEX Scheduled: 11/01/18 Patient is aware when next depo is due tr9i Pt tolerated Injection well in LUOQ  Routed to provider for review, encounter closed.

## 2018-09-17 MED FILL — DESCOVY 200-25 MG TABS: 200-25 | 30 days supply | Qty: 30 | Fill #3

## 2018-09-17 MED FILL — TIVICAY 50 MG TABLET: 50 | 30 days supply | Qty: 30 | Fill #3

## 2018-09-25 ENCOUNTER — Telehealth: Payer: Self-pay | Admitting: *Deleted

## 2018-09-25 NOTE — Telephone Encounter (Signed)
error 

## 2018-10-03 ENCOUNTER — Ambulatory Visit: Payer: BLUE CROSS/BLUE SHIELD | Admitting: Internal Medicine

## 2018-10-08 ENCOUNTER — Other Ambulatory Visit: Payer: Self-pay | Admitting: Internal Medicine

## 2018-10-08 ENCOUNTER — Telehealth: Payer: Self-pay

## 2018-10-08 DIAGNOSIS — B20 Human immunodeficiency virus [HIV] disease: Secondary | ICD-10-CM

## 2018-10-08 NOTE — Telephone Encounter (Signed)
Called patient to reschedule missed appointment with Dr. Luciana Axeomer. Call was answered by patient's husband who will have patient call office back to reschedule missed appointment. Allison Wilson, [place]

## 2018-10-15 MED FILL — TIVICAY 50 MG TABLET: 50 | 30 days supply | Qty: 30 | Fill #0

## 2018-10-15 MED FILL — DESCOVY 200-25 MG TABS: 200-25 | 30 days supply | Qty: 30 | Fill #0

## 2018-10-30 ENCOUNTER — Ambulatory Visit (INDEPENDENT_AMBULATORY_CARE_PROVIDER_SITE_OTHER): Payer: BLUE CROSS/BLUE SHIELD | Admitting: Internal Medicine

## 2018-10-30 ENCOUNTER — Encounter: Payer: Self-pay | Admitting: Internal Medicine

## 2018-10-30 VITALS — BP 111/73 | HR 73 | Temp 98.2°F | Wt 169.0 lb

## 2018-10-30 DIAGNOSIS — B20 Human immunodeficiency virus [HIV] disease: Secondary | ICD-10-CM | POA: Diagnosis not present

## 2018-10-30 DIAGNOSIS — R87612 Low grade squamous intraepithelial lesion on cytologic smear of cervix (LGSIL): Secondary | ICD-10-CM | POA: Diagnosis not present

## 2018-10-30 DIAGNOSIS — Z79899 Other long term (current) drug therapy: Secondary | ICD-10-CM

## 2018-10-30 DIAGNOSIS — Z23 Encounter for immunization: Secondary | ICD-10-CM | POA: Diagnosis not present

## 2018-10-30 DIAGNOSIS — N289 Disorder of kidney and ureter, unspecified: Secondary | ICD-10-CM | POA: Diagnosis not present

## 2018-10-30 MED ORDER — EMTRICITABINE-TENOFOVIR AF 200-25 MG PO TABS: 1.0000 | ORAL_TABLET | Freq: Every day | ORAL | 11 refills | Status: DC

## 2018-10-30 MED ORDER — DOLUTEGRAVIR SODIUM 50 MG PO TABS: ORAL_TABLET | ORAL | 11 refills | Status: DC

## 2018-10-30 NOTE — Assessment & Plan Note (Signed)
Established with gynecology

## 2018-10-30 NOTE — Assessment & Plan Note (Signed)
Doing well on current ARVs.  Not interested in one pill option so will continue.  rtc 6 months She is aware of need to change if she desires pregnancy

## 2018-10-30 NOTE — Assessment & Plan Note (Signed)
Mild elevation last visit.  Will recheck today

## 2018-10-30 NOTE — Progress Notes (Signed)
   Subjective:    Patient ID: Allison Wilson, female    DOB: 1981/10/20, 38 y.o.   MRN: [id number]  HPI Here for follow up of HIV She continues on Tivicay and Descovy and denies any missed doses.  Last CD4 of 300 and viral load < 20.  No new complaints. Has established with gynecology and getting depo shots there.  No associated rash or diarrhea.  Had her flu shot at her work.     Review of Systems  Constitutional: Negative for fatigue and fever.  Gastrointestinal: Negative for diarrhea.  Skin: Negative for rash.       Objective:   Physical Exam Constitutional:      Appearance: She is well-developed.  Eyes:     General: No scleral icterus. Cardiovascular:     Rate and Rhythm: Normal rate and regular rhythm.     Heart sounds: Normal heart sounds. No murmur.  Pulmonary:     Effort: Pulmonary effort is normal. No respiratory distress.     Breath sounds: Normal breath sounds.  Lymphadenopathy:     Cervical: No cervical adenopathy.  Skin:    Findings: No rash.    SH: no tobacco       Assessment & Plan:

## 2018-10-30 NOTE — Assessment & Plan Note (Signed)
Will get Prevnar an dMenveo today Pneumococcus later this year

## 2018-10-30 NOTE — Progress Notes (Signed)
GYNECOLOGY  VISIT   HPI: 38 y.o.   Married Black or [place] American Not Hispanic or Latino  female   G1P1 with Patient's last menstrual period was 10/31/2018 (exact date).   here for follow up pap from 05/04/2018. Pap was negative with negative HPV. Had LSIL in 2018. Given positive HIV 6 month follow up pap recommended. She is without c/o.  GYNECOLOGIC HISTORY: Patient's last menstrual period was 10/31/2018 (exact date). Contraception:None Menopausal hormone therapy: None        OB History    Gravida  1   Para  1   Term      Preterm      AB      Living        SAB      TAB      Ectopic      Multiple      Live Births                 Patient Active Problem List   Diagnosis Date Noted  . Need for prophylactic vaccination against Streptococcus pneumoniae (pneumococcus) 10/30/2018  . Renal insufficiency 10/30/2018  . Encounter for long-term (current) use of high-risk medication 04/09/2018  . LGSIL on Pap smear of cervix 04/10/2017  . HIV disease (HCC) 11/28/2016  . Screening examination for venereal disease 12/24/2015  . History of Mycobacterium avium intracellulare infection 12/24/2015  . Anemia 11/30/2015  . Gallbladder disease   . Acalculous cholecystitis 11/27/2015  . TUBERCULOSIS, LATENT 12/12/2006  . Poor dentition 12/12/2006    Past Medical History:  Diagnosis Date  . Anemia   . Hepatitis A   . HIV (human immunodeficiency virus infection) (HCC)   . Hypertension     History reviewed. No pertinent surgical history.  Current Outpatient Medications  Medication Sig Dispense Refill  . carvedilol (COREG) 6.25 MG tablet     . dolutegravir (TIVICAY) 50 MG tablet TAKE 1 TABLET (50 MG TOTAL) BY MOUTH DAILY. 30 tablet 11  . emtricitabine-tenofovir AF (DESCOVY) 200-25 MG tablet Take 1 tablet by mouth daily. 30 tablet 11  . ferrous sulfate 325 (65 FE) MG tablet Take 1 tablet by mouth daily.    Marland Kitchen lisinopril (PRINIVIL,ZESTRIL) 20 MG tablet Take 20 mg by mouth  daily.    Marland Kitchen lisinopril (PRINIVIL,ZESTRIL) 30 MG tablet lisinopril 30 mg tablet    . Vitamin D, Ergocalciferol, (DRISDOL) 50000 units CAPS capsule Take 1 capsule (50,000 Units total) by mouth every 7 (seven) days. (Patient not taking: Reported on 11/01/2018) 30 capsule 0   No current facility-administered medications for this visit.      ALLERGIES: Patient has no known allergies.  History reviewed. No pertinent family history.  Social History   Socioeconomic History  . Marital status: Married    Spouse name: Not on file  . Number of children: Not on file  . Years of education: Not on file  . Highest education level: Not on file  Occupational History  . Not on file  Social Needs  . Financial resource strain: Not on file  . Food insecurity:    Worry: Not on file    Inability: Not on file  . Transportation needs:    Medical: Not on file    Non-medical: Not on file  Tobacco Use  . Smoking status: Never Smoker  . Smokeless tobacco: Never Used  Substance and Sexual Activity  . Alcohol use: No    Alcohol/week: 0.0 standard drinks  . Drug use: No  .  Sexual activity: Yes    Partners: Male    Birth control/protection: None  Lifestyle  . Physical activity:    Days per week: Not on file    Minutes per session: Not on file  . Stress: Not on file  Relationships  . Social connections:    Talks on phone: Not on file    Gets together: Not on file    Attends religious service: Not on file    Active member of club or organization: Not on file    Attends meetings of clubs or organizations: Not on file    Relationship status: Not on file  . Intimate partner violence:    Fear of current or ex partner: Not on file    Emotionally abused: Not on file    Physically abused: Not on file    Forced sexual activity: Not on file  Other Topics Concern  . Not on file  Social History Narrative  . Not on file    Review of Systems  Constitutional: Negative.   HENT: Negative.   Eyes:  Negative.   Respiratory: Negative.   Cardiovascular: Negative.   Gastrointestinal: Negative.   Genitourinary: Negative.   Musculoskeletal: Negative.   Skin: Negative.   Neurological: Negative.   Endo/Heme/Allergies: Negative.   Psychiatric/Behavioral: Negative.     PHYSICAL EXAMINATION:    BP 130/80 (BP Location: Right Arm, Patient Position: Sitting, Cuff Size: Normal)   Pulse 68   Wt 168 lb 12.8 oz (76.6 kg)   LMP 10/31/2018 (Exact Date)   BMI 28.09 kg/m     General appearance: alert, cooperative and appears stated age  Pelvic: External genitalia:  no lesions              Urethra:  normal appearing urethra with no masses, tenderness or lesions              Bartholins and Skenes: normal                 Vagina: normal appearing vagina with normal color and discharge, no lesions              Cervix: no lesions               Chaperone was present for exam.  ASSESSMENT H/O LSIL in 2018, normal pap in 7/19 HIV +    PLAN Pap with hpv done F/U for annual in 7/20   An After Visit Summary was printed and given to the patient.

## 2018-10-31 LAB — T-HELPER CELL (CD4) - (RCID CLINIC ONLY)
CD4 % Helper T Cell: 22 % — ABNORMAL LOW (ref 33–55)
CD4 T Cell Abs: 260 /uL — ABNORMAL LOW (ref 400–2700)

## 2018-11-01 ENCOUNTER — Encounter: Payer: Self-pay | Admitting: Obstetrics and Gynecology

## 2018-11-01 ENCOUNTER — Other Ambulatory Visit: Payer: Self-pay

## 2018-11-01 ENCOUNTER — Ambulatory Visit: Payer: BLUE CROSS/BLUE SHIELD | Admitting: Obstetrics and Gynecology

## 2018-11-01 ENCOUNTER — Other Ambulatory Visit (HOSPITAL_COMMUNITY)
Admission: RE | Admit: 2018-11-01 | Discharge: 2018-11-01 | Disposition: A | Discharge status: Home / Self Care | Payer: BLUE CROSS/BLUE SHIELD | Source: Ambulatory Visit | Attending: Obstetrics and Gynecology | Admitting: Obstetrics and Gynecology

## 2018-11-01 VITALS — BP 130/80 | HR 68 | Wt 168.8 lb

## 2018-11-01 DIAGNOSIS — B20 Human immunodeficiency virus [HIV] disease: Secondary | ICD-10-CM | POA: Insufficient documentation

## 2018-11-01 DIAGNOSIS — Z87410 Personal history of cervical dysplasia: Secondary | ICD-10-CM | POA: Insufficient documentation

## 2018-11-01 DIAGNOSIS — Z124 Encounter for screening for malignant neoplasm of cervix: Secondary | ICD-10-CM | POA: Diagnosis not present

## 2018-11-01 LAB — BASIC METABOLIC PANEL
BUN/Creatinine Ratio: 14 (calc) (ref 6–22)
BUN: 17 mg/dL (ref 7–25)
CO2: 24 mmol/L (ref 20–32)
Calcium: 9.1 mg/dL (ref 8.6–10.2)
Chloride: 107 mmol/L (ref 98–110)
Creat: 1.21 mg/dL — ABNORMAL HIGH (ref 0.50–1.10)
Glucose, Bld: 74 mg/dL (ref 65–99)
POTASSIUM: 3.9 mmol/L (ref 3.5–5.3)
Sodium: 139 mmol/L (ref 135–146)

## 2018-11-01 LAB — HIV-1 RNA QUANT-NO REFLEX-BLD
HIV 1 RNA Quant: <20 copies/mL
HIV-1 RNA Quant, Log: <1.3 Log copies/mL

## 2018-11-02 LAB — CYTOLOGY - PAP
Diagnosis: NEGATIVE
HPV: NOT DETECTED

## 2018-11-13 ENCOUNTER — Ambulatory Visit (INDEPENDENT_AMBULATORY_CARE_PROVIDER_SITE_OTHER): Payer: BLUE CROSS/BLUE SHIELD

## 2018-11-13 VITALS — BP 122/60 | HR 66 | Resp 14 | Ht 65.0 in | Wt 167.0 lb

## 2018-11-13 DIAGNOSIS — Z3042 Encounter for surveillance of injectable contraceptive: Secondary | ICD-10-CM

## 2018-11-13 MED ORDER — MEDROXYPROGESTERONE ACETATE 150 MG/ML IM SUSP
150.0000 mg | Freq: Once | INTRAMUSCULAR | Status: AC
  Administered 2018-11-13: 150 mg via INTRAMUSCULAR

## 2018-11-13 NOTE — Progress Notes (Signed)
Patient is here for Depo Provera Injection Patient is within Depo Provera Calender Limits yes last given 08/28/18 Next Depo Due between: 01/30/19 - 02/13/19 Last OV 11/01/17 with JJ AEX Scheduled: nothing scheduled at this time.  Patient is aware when next depo is due  Pt tolerated Injection well in RUOQ  Routed to provider for review, encounter closed.

## 2018-11-15 ENCOUNTER — Telehealth: Payer: Self-pay | Admitting: Pharmacy Technician

## 2018-11-15 MED FILL — DESCOVY 200-25 MG TABS: 200-25 | 30 days supply | Qty: 30 | Fill #0

## 2018-11-15 MED FILL — TIVICAY 50 MG TABLET: 50 | 30 days supply | Qty: 30 | Fill #0

## 2018-11-15 NOTE — Telephone Encounter (Signed)
TIVICAY COPAY CARD 2020 RxBIN: [id number] RxPCN: [id number] RxGRP: [id number]  ISSUER: [id number] FB:[phone]

## 2018-12-11 MED FILL — DESCOVY 200-25 MG TABS: 200-25 | 30 days supply | Qty: 30 | Fill #1

## 2018-12-11 MED FILL — TIVICAY 50 MG TABLET: 50 | 30 days supply | Qty: 30 | Fill #1

## 2018-12-12 DIAGNOSIS — J111 Influenza due to unidentified influenza virus with other respiratory manifestations: Secondary | ICD-10-CM | POA: Diagnosis not present

## 2018-12-13 ENCOUNTER — Ambulatory Visit: Payer: BLUE CROSS/BLUE SHIELD | Admitting: Internal Medicine

## 2019-01-07 MED FILL — DESCOVY 200-25 MG TABS: 200-25 | 30 days supply | Qty: 30 | Fill #2

## 2019-01-07 MED FILL — TIVICAY 50 MG TABLET: 50 | 30 days supply | Qty: 30 | Fill #2

## 2019-01-30 ENCOUNTER — Ambulatory Visit: Payer: BLUE CROSS/BLUE SHIELD

## 2019-02-04 ENCOUNTER — Ambulatory Visit (INDEPENDENT_AMBULATORY_CARE_PROVIDER_SITE_OTHER): Payer: BLUE CROSS/BLUE SHIELD

## 2019-02-04 ENCOUNTER — Other Ambulatory Visit: Payer: Self-pay

## 2019-02-04 VITALS — BP 120/76 | HR 76 | Resp 14 | Ht 64.0 in | Wt 169.0 lb

## 2019-02-04 DIAGNOSIS — Z3042 Encounter for surveillance of injectable contraceptive: Secondary | ICD-10-CM | POA: Diagnosis not present

## 2019-02-04 MED ORDER — MEDROXYPROGESTERONE ACETATE 150 MG/ML IM SUSP
150.0000 mg | Freq: Once | INTRAMUSCULAR | Status: AC
  Administered 2019-02-04: 150 mg via INTRAMUSCULAR

## 2019-02-04 NOTE — Progress Notes (Signed)
Patient is here for Depo Provera Injection Patient is within Depo Provera Calender Limits yes Next Depo Due between: 6/29-7/13 Last AEX: 05/04/18 JJ AEX Scheduled: none scheduled  Patient is aware when next depo is due  Pt tolerated Injection well in LUOQ.  Routed to provider for review, encounter closed.

## 2019-02-15 MED FILL — TIVICAY 50 MG TABLET: 50 | 30 days supply | Qty: 30 | Fill #3

## 2019-02-15 MED FILL — DESCOVY 200-25 MG TABS: 200-25 | 30 days supply | Qty: 30 | Fill #3

## 2019-03-12 MED FILL — DESCOVY 200-25 MG TABS: 200-25 | 30 days supply | Qty: 30 | Fill #4

## 2019-03-12 MED FILL — TIVICAY 50 MG TABLET: 50 | 30 days supply | Qty: 30 | Fill #4

## 2019-04-08 MED FILL — TIVICAY 50 MG TABLET: 50 | 30 days supply | Qty: 30 | Fill #5

## 2019-04-08 MED FILL — DESCOVY 200-25 MG TABS: 200-25 | 30 days supply | Qty: 30 | Fill #5

## 2019-04-19 ENCOUNTER — Other Ambulatory Visit: Payer: Self-pay | Admitting: *Deleted

## 2019-04-19 DIAGNOSIS — B20 Human immunodeficiency virus [HIV] disease: Secondary | ICD-10-CM

## 2019-04-19 DIAGNOSIS — Z113 Encounter for screening for infections with a predominantly sexual mode of transmission: Secondary | ICD-10-CM

## 2019-04-22 ENCOUNTER — Ambulatory Visit (INDEPENDENT_AMBULATORY_CARE_PROVIDER_SITE_OTHER): Payer: BC Managed Care – PPO | Admitting: *Deleted

## 2019-04-22 ENCOUNTER — Other Ambulatory Visit: Payer: BC Managed Care – PPO

## 2019-04-22 ENCOUNTER — Other Ambulatory Visit: Payer: Self-pay

## 2019-04-22 VITALS — BP 150/98 | Temp 98.3°F | Resp 14 | Ht 64.0 in | Wt 171.2 lb

## 2019-04-22 DIAGNOSIS — Z3042 Encounter for surveillance of injectable contraceptive: Secondary | ICD-10-CM

## 2019-04-22 DIAGNOSIS — Z113 Encounter for screening for infections with a predominantly sexual mode of transmission: Secondary | ICD-10-CM | POA: Diagnosis not present

## 2019-04-22 DIAGNOSIS — B20 Human immunodeficiency virus [HIV] disease: Secondary | ICD-10-CM

## 2019-04-22 MED ORDER — MEDROXYPROGESTERONE ACETATE 150 MG/ML IM SUSP
150.0000 mg | Freq: Once | INTRAMUSCULAR | Status: AC
  Administered 2019-04-22: 150 mg via INTRAMUSCULAR

## 2019-04-22 NOTE — Progress Notes (Signed)
Patient is here for Depo Provera Injection Patient is within Depo Provera Calender Limits yes, last given 02-04-2019 Next Depo Due between: 9-14 to 9-28  Last AEX: 05-04-18 JJ  AEX Scheduled: declines to schedule at this time  Patient is aware when next depo is due.   Pt tolerated Injection well in RUOQ.   Reviewed patient's BP reading with Dr. Talbert Nan and patient advised to follow up with PCP in regards to BP elevation. Patient agreeable.   Routed to provider for review, encounter closed.

## 2019-04-23 LAB — T-HELPER CELL (CD4) - (RCID CLINIC ONLY)
CD4 % Helper T Cell: 27 % — ABNORMAL LOW (ref 33–65)
CD4 T Cell Abs: 302 /uL — ABNORMAL LOW (ref 400–1790)

## 2019-04-30 LAB — COMPLETE METABOLIC PANEL WITH GFR
AG Ratio: 1 (calc) (ref 1.0–2.5)
ALT: 10 U/L (ref 6–29)
AST: 14 U/L (ref 10–30)
Albumin: 4 g/dL (ref 3.6–5.1)
Alkaline phosphatase (APISO): 60 U/L (ref 31–125)
BUN: 18 mg/dL (ref 7–25)
CO2: 22 mmol/L (ref 20–32)
Calcium: 9.6 mg/dL (ref 8.6–10.2)
Chloride: 108 mmol/L (ref 98–110)
Creat: 1.08 mg/dL (ref 0.50–1.10)
GFR, Est African American: 75 mL/min/{1.73_m2} (ref >=60)
GFR, Est Non African American: 65 mL/min/{1.73_m2} (ref >=60)
Globulin: 4 g/dL (calc) — ABNORMAL HIGH (ref 1.9–3.7)
Glucose, Bld: 90 mg/dL (ref 65–99)
Potassium: 3.6 mmol/L (ref 3.5–5.3)
Sodium: 137 mmol/L (ref 135–146)
Total Bilirubin: 0.3 mg/dL (ref 0.2–1.2)
Total Protein: 8 g/dL (ref 6.1–8.1)

## 2019-04-30 LAB — CBC WITH DIFFERENTIAL/PLATELET
Absolute Monocytes: 500 cells/uL (ref 200–950)
Basophils Absolute: 40 cells/uL (ref 0–200)
Basophils Relative: 1 %
Eosinophils Absolute: 132 cells/uL (ref 15–500)
Eosinophils Relative: 3.3 %
HCT: 34.3 % — ABNORMAL LOW (ref 35.0–45.0)
Hemoglobin: 11.6 g/dL — ABNORMAL LOW (ref 11.7–15.5)
Lymphs Abs: 1260 cells/uL (ref 850–3900)
MCH: 28.9 pg (ref 27.0–33.0)
MCHC: 33.8 g/dL (ref 32.0–36.0)
MCV: 85.3 fL (ref 80.0–100.0)
MPV: 10.7 fL (ref 7.5–12.5)
Monocytes Relative: 12.5 %
Neutro Abs: 2068 cells/uL (ref 1500–7800)
Neutrophils Relative %: 51.7 %
Platelets: 191 10*3/uL (ref 140–400)
RBC: 4.02 10*6/uL (ref 3.80–5.10)
RDW: 13.1 % (ref 11.0–15.0)
Total Lymphocyte: 31.5 %
WBC: 4 10*3/uL (ref 3.8–10.8)

## 2019-04-30 LAB — HIV-1 RNA QUANT-NO REFLEX-BLD
HIV 1 RNA Quant: <20 copies/mL
HIV-1 RNA Quant, Log: <1.3 Log copies/mL

## 2019-04-30 LAB — RPR: RPR Ser Ql: NONREACTIVE

## 2019-05-08 ENCOUNTER — Ambulatory Visit: Payer: BC Managed Care – PPO | Admitting: Internal Medicine

## 2019-05-08 ENCOUNTER — Encounter: Payer: Self-pay | Admitting: Internal Medicine

## 2019-05-08 ENCOUNTER — Other Ambulatory Visit: Payer: Self-pay

## 2019-05-08 VITALS — BP 128/60 | HR 80 | Temp 98.0°F | Ht 64.0 in | Wt 172.0 lb

## 2019-05-08 DIAGNOSIS — B20 Human immunodeficiency virus [HIV] disease: Secondary | ICD-10-CM

## 2019-05-08 DIAGNOSIS — N289 Disorder of kidney and ureter, unspecified: Secondary | ICD-10-CM | POA: Diagnosis not present

## 2019-05-08 DIAGNOSIS — Z113 Encounter for screening for infections with a predominantly sexual mode of transmission: Secondary | ICD-10-CM | POA: Diagnosis not present

## 2019-05-08 NOTE — Assessment & Plan Note (Signed)
She is doing well with her regimen and not interested in changing to one pill only.   rtc 6 months.

## 2019-05-08 NOTE — Progress Notes (Signed)
   Subjective:    Patient ID: Allison Wilson, female    DOB: 1981-04-05, 38 y.o.   MRN: [id number]  HPI Here for follow up of HIV She continues on Tivicay and Descovy and denies any missed doses.  CD4 of 302 and viral load < 20.  No new complaints. Has established with gynecology and getting depo shots there.  No associated rash or diarrhea.     Review of Systems  Constitutional: Negative for fatigue and fever.  Gastrointestinal: Negative for diarrhea.  Skin: Negative for rash.       Objective:   Physical Exam Constitutional:      Appearance: She is well-developed.  Eyes:     General: No scleral icterus. Cardiovascular:     Rate and Rhythm: Normal rate and regular rhythm.     Heart sounds: Normal heart sounds. No murmur.  Pulmonary:     Effort: Pulmonary effort is normal. No respiratory distress.     Breath sounds: Normal breath sounds.  Lymphadenopathy:     Cervical: No cervical adenopathy.  Skin:    Findings: No rash.    SH: no tobacco       Assessment & Plan:

## 2019-05-08 NOTE — Assessment & Plan Note (Signed)
Creat back to wnl, no new concerns.

## 2019-05-08 NOTE — Assessment & Plan Note (Signed)
Screened negative 

## 2019-05-13 MED FILL — DESCOVY 200-25 MG TABS: 200-25 | 30 days supply | Qty: 30 | Fill #6

## 2019-05-13 MED FILL — TIVICAY 50 MG TABLET: 50 | 30 days supply | Qty: 30 | Fill #6

## 2019-06-10 MED FILL — DESCOVY 200-25 MG TABS: 200-25 | 30 days supply | Qty: 30 | Fill #7

## 2019-06-10 MED FILL — TIVICAY 50 MG TABLET: 50 | 30 days supply | Qty: 30 | Fill #7

## 2019-07-08 ENCOUNTER — Ambulatory Visit (INDEPENDENT_AMBULATORY_CARE_PROVIDER_SITE_OTHER): Payer: BC Managed Care – PPO

## 2019-07-08 ENCOUNTER — Other Ambulatory Visit: Payer: Self-pay

## 2019-07-08 VITALS — BP 126/74 | HR 78 | Temp 97.3°F | Resp 14 | Ht 64.0 in | Wt 173.4 lb

## 2019-07-08 DIAGNOSIS — Z3042 Encounter for surveillance of injectable contraceptive: Secondary | ICD-10-CM

## 2019-07-08 MED ORDER — MEDROXYPROGESTERONE ACETATE 150 MG/ML IM SUSP
150.0000 mg | Freq: Once | INTRAMUSCULAR | Status: AC
  Administered 2019-07-08: 150 mg via INTRAMUSCULAR

## 2019-07-08 MED FILL — DESCOVY 200-25 MG TABS: 200-25 | 30 days supply | Qty: 30 | Fill #8

## 2019-07-08 MED FILL — TIVICAY 50 MG TABLET: 50 | 30 days supply | Qty: 30 | Fill #8

## 2019-07-08 NOTE — Progress Notes (Signed)
Patient is here for Depo Provera Injection Patient is within Depo Provera Calender Limits yes last given 04/22/19 Next Depo Due between: 09/23/11/4 Last AEX: 11/01/18 AEX Scheduled: 11/25/19  Patient is aware when next depo is due  Pt tolerated Injection well RUOQ   Routed to provider for review, encounter closed.

## 2019-08-05 MED FILL — TIVICAY 50 MG TABLET: 50 | 30 days supply | Qty: 30 | Fill #9

## 2019-08-05 MED FILL — DESCOVY 200-25 MG TABS: 200-25 | 30 days supply | Qty: 30 | Fill #9

## 2019-09-02 MED FILL — DESCOVY 200-25 MG TABS: 200-25 | 30 days supply | Qty: 30 | Fill #10

## 2019-09-02 MED FILL — TIVICAY 50 MG TABLET: 50 | 30 days supply | Qty: 30 | Fill #10

## 2019-09-17 NOTE — Progress Notes (Signed)
Patient is here for Depo Provera Injection Patient is within Depo Provera Calender Limits yes last given 07/08/19  Next Depo Due between:Feb 15 - March 1  Last AEX: 11/02/2018 AEX Scheduled: none scheduled   Patient is aware when next depo is due  Pt tolerated Injection well LUOQ  Routed to provider for review, encounter closed.

## 2019-09-23 ENCOUNTER — Other Ambulatory Visit: Payer: Self-pay

## 2019-09-23 ENCOUNTER — Ambulatory Visit (INDEPENDENT_AMBULATORY_CARE_PROVIDER_SITE_OTHER): Payer: BC Managed Care – PPO

## 2019-09-23 VITALS — BP 108/62 | Temp 96.9°F | Ht 64.0 in | Wt 175.4 lb

## 2019-09-23 DIAGNOSIS — Z3042 Encounter for surveillance of injectable contraceptive: Secondary | ICD-10-CM | POA: Diagnosis not present

## 2019-09-23 MED ORDER — MEDROXYPROGESTERONE ACETATE 150 MG/ML IM SUSP
150.0000 mg | Freq: Once | INTRAMUSCULAR | Status: AC
  Administered 2019-09-23: 150 mg via INTRAMUSCULAR

## 2019-09-26 DIAGNOSIS — Z20828 Contact with and (suspected) exposure to other viral communicable diseases: Secondary | ICD-10-CM | POA: Diagnosis not present

## 2019-09-29 DIAGNOSIS — Z13228 Encounter for screening for other metabolic disorders: Secondary | ICD-10-CM | POA: Diagnosis not present

## 2019-09-29 DIAGNOSIS — Z131 Encounter for screening for diabetes mellitus: Secondary | ICD-10-CM | POA: Diagnosis not present

## 2019-09-29 DIAGNOSIS — Z79899 Other long term (current) drug therapy: Secondary | ICD-10-CM | POA: Diagnosis not present

## 2019-09-30 MED FILL — DESCOVY 200-25 MG TABS: 200-25 | 30 days supply | Qty: 30 | Fill #11

## 2019-09-30 MED FILL — TIVICAY 50 MG TABLET: 50 | 30 days supply | Qty: 30 | Fill #11

## 2019-10-02 DIAGNOSIS — Z20828 Contact with and (suspected) exposure to other viral communicable diseases: Secondary | ICD-10-CM | POA: Diagnosis not present

## 2019-10-22 ENCOUNTER — Other Ambulatory Visit: Payer: Self-pay | Admitting: Internal Medicine

## 2019-10-22 DIAGNOSIS — B20 Human immunodeficiency virus [HIV] disease: Secondary | ICD-10-CM

## 2019-10-28 MED FILL — DESCOVY 200-25 MG TABS: 200-25 | 30 days supply | Qty: 30 | Fill #0

## 2019-10-28 MED FILL — TIVICAY 50 MG TABLET: 50 | 30 days supply | Qty: 30 | Fill #0

## 2019-11-07 ENCOUNTER — Other Ambulatory Visit: Payer: BC Managed Care – PPO

## 2019-11-25 ENCOUNTER — Other Ambulatory Visit: Payer: Self-pay

## 2019-11-25 ENCOUNTER — Other Ambulatory Visit: Payer: BC Managed Care – PPO

## 2019-11-25 ENCOUNTER — Encounter: Payer: BC Managed Care – PPO | Admitting: Internal Medicine

## 2019-11-25 DIAGNOSIS — B20 Human immunodeficiency virus [HIV] disease: Secondary | ICD-10-CM

## 2019-11-25 MED FILL — DESCOVY 200-25 MG TABS: 200-25 | 30 days supply | Qty: 30 | Fill #1

## 2019-11-25 MED FILL — TIVICAY 50 MG TABLET: 50 | 30 days supply | Qty: 30 | Fill #1

## 2019-11-26 LAB — T-HELPER CELL (CD4) - (RCID CLINIC ONLY)
CD4 % Helper T Cell: 27 % — ABNORMAL LOW (ref 33–65)
CD4 T Cell Abs: 256 /uL — ABNORMAL LOW (ref 400–1790)

## 2019-11-28 LAB — HIV-1 RNA QUANT-NO REFLEX-BLD
HIV 1 RNA Quant: <20 copies/mL
HIV-1 RNA Quant, Log: <1.3 Log copies/mL

## 2019-12-04 ENCOUNTER — Other Ambulatory Visit: Payer: Self-pay

## 2019-12-09 ENCOUNTER — Ambulatory Visit: Payer: BC Managed Care – PPO | Admitting: Internal Medicine

## 2019-12-09 ENCOUNTER — Ambulatory Visit: Payer: BC Managed Care – PPO

## 2019-12-09 ENCOUNTER — Other Ambulatory Visit: Payer: Self-pay

## 2019-12-09 ENCOUNTER — Encounter: Payer: Self-pay | Admitting: Internal Medicine

## 2019-12-09 VITALS — BP 121/78 | HR 80 | Temp 98.1°F | Ht 64.0 in | Wt 176.0 lb

## 2019-12-09 DIAGNOSIS — Z79899 Other long term (current) drug therapy: Secondary | ICD-10-CM | POA: Diagnosis not present

## 2019-12-09 DIAGNOSIS — Z113 Encounter for screening for infections with a predominantly sexual mode of transmission: Secondary | ICD-10-CM | POA: Diagnosis not present

## 2019-12-09 DIAGNOSIS — Z23 Encounter for immunization: Secondary | ICD-10-CM

## 2019-12-09 DIAGNOSIS — B20 Human immunodeficiency virus [HIV] disease: Secondary | ICD-10-CM

## 2019-12-10 ENCOUNTER — Encounter: Payer: Self-pay | Admitting: Internal Medicine

## 2019-12-10 DIAGNOSIS — Z23 Encounter for immunization: Secondary | ICD-10-CM | POA: Insufficient documentation

## 2019-12-10 NOTE — Progress Notes (Signed)
   Subjective:    Patient ID: Allison Wilson, female    DOB: Oct 01, 1981, 39 y.o.   MRN: [id number]  HPI Here for follow up of HIV She continues on Tivicay and descovy and no missed doses.  No new concerns.  Did have her COVID vaccine at work.  No associated n/v/d.     Review of Systems  Constitutional: Negative for fatigue and fever.  Gastrointestinal: Negative for diarrhea.  Skin: Negative for rash.       Objective:   Physical Exam Constitutional:      Appearance: She is well-developed.  Eyes:     General: No scleral icterus. Cardiovascular:     Rate and Rhythm: Normal rate.  Pulmonary:     Effort: Pulmonary effort is normal. No respiratory distress.  Skin:    Findings: No rash.  Psychiatric:        Mood and Affect: Mood normal.    SH: no tobacco       Assessment & Plan:

## 2019-12-10 NOTE — Assessment & Plan Note (Signed)
She is due for pneumovax but will defer for now since she just received the COVID vaccine.   Will do in 6 months upon return.

## 2019-12-10 NOTE — Assessment & Plan Note (Signed)
She is doing well and no changes.  CD4 stable at 256 and suppressed virus.   rtc 6 months.

## 2019-12-11 ENCOUNTER — Other Ambulatory Visit: Payer: Self-pay

## 2019-12-16 ENCOUNTER — Ambulatory Visit (INDEPENDENT_AMBULATORY_CARE_PROVIDER_SITE_OTHER): Payer: BC Managed Care – PPO | Admitting: Obstetrics and Gynecology

## 2019-12-16 ENCOUNTER — Other Ambulatory Visit: Payer: Self-pay

## 2019-12-16 VITALS — BP 120/78 | HR 82 | Temp 98.4°F | Ht 64.0 in | Wt 176.2 lb

## 2019-12-16 DIAGNOSIS — Z3042 Encounter for surveillance of injectable contraceptive: Secondary | ICD-10-CM | POA: Diagnosis not present

## 2019-12-16 MED ORDER — MEDROXYPROGESTERONE ACETATE 150 MG/ML IM SUSP
150.0000 mg | Freq: Once | INTRAMUSCULAR | Status: AC
  Administered 2019-12-16: 150 mg via INTRAMUSCULAR

## 2019-12-16 NOTE — Progress Notes (Signed)
Patient is here for Depo Provera Injection Patient is within Depo Provera Calender Limits 2/15-12/23/19 Next Depo Due between: 5/10-5/24/21 Last AEX: 11/02/2018 AEX Scheduled: 01/01/2020  Patient is aware when next depo is due  Pt tolerated Injection well in right UOQ  Routed to provider for review, encounter closed.

## 2019-12-23 MED FILL — TIVICAY 50 MG TABLET: 50 | 30 days supply | Qty: 30 | Fill #2

## 2019-12-23 MED FILL — DESCOVY 200-25 MG TABS: 200-25 | 30 days supply | Qty: 30 | Fill #2

## 2019-12-30 NOTE — Progress Notes (Signed)
39 y.o. G1P1 Married Black or [place] American Not Hispanic or Latino female here for annual exam. Patient wants to ask about her last pap result, she isn't good at looking at [employer]. She reports random spotting ~1 x a month with the Depo-provera. No dyspareunia.  HIV is under good control.   Period Cycle (Days): 28 Period Duration (Days): 1 Period Pattern: Regular Menstrual Flow: Light Menstrual Control: Tampon Menstrual Control Change Freq (Hours): 8-10 Dysmenorrhea: None  Patient's last menstrual period was 12/11/2019 (approximate).          Sexually active: Yes.    The current method of family planning is Depo-Provera injections.    Exercising: Yes.    Walking/running  Smoker:  no  Health Maintenance: Pap 11/01/2018 negative HR HPV Neg  05/04/18 neg HR HPV NEG, 2018 LSIL History of abnormal Pap:  Yes LSIL in 2018 TDaP:  no Gardasil: Completed    reports that she has never smoked. She has never used smokeless tobacco. She reports that she does not drink alcohol or use drugs. Works in [employer]. Daughter is Paramedic at [employer].   Past Medical History:  Diagnosis Date  . Anemia   . Hepatitis A   . HIV (human immunodeficiency virus infection) ([facility])   . Hypertension     History reviewed. No pertinent surgical history.  Current Outpatient Medications  Medication Sig Dispense Refill  . ferrous sulfate 325 (65 FE) MG tablet Take 1 tablet by mouth daily.    Marland Kitchen TIVICAY 50 MG tablet TAKE 1 TABLET (50 MG TOTAL) BY MOUTH DAILY. 30 tablet 2  . aspirin 81 MG EC tablet Adult Low Dose Aspirin 81 mg tablet,delayed release  Take 1 tablet every day by oral route.    . carvedilol (COREG) 6.25 MG tablet     . DESCOVY 200-25 MG tablet TAKE 1 TABLET BY MOUTH DAILY. 30 tablet 2  . lisinopril (PRINIVIL,ZESTRIL) 30 MG tablet lisinopril 30 mg tablet    . lisinopril-hydrochlorothiazide (ZESTORETIC) 20-12.5 MG tablet      No current facility-administered medications for this visit.     History reviewed. No pertinent family history.  Review of Systems  All other systems reviewed and are negative.   Exam:   BP 118/62   Pulse 84   Temp 98.2 F (36.8 C)   Ht 5\' 5"  (1.651 m)   Wt 174 lb (78.9 kg)   LMP 12/11/2019 (Approximate)   SpO2 99%   BMI 28.96 kg/m   Weight change: @WEIGHTCHANGE @ Height:   Height: 5\' 5"  (165.1 cm)  Ht Readings from Last 3 Encounters:  01/01/20 5\' 5"  (1.651 m)  12/16/19 5\' 4"  (1.626 m)  12/09/19 5\' 4"  (1.626 m)    General appearance: alert, cooperative and appears stated age Head: Normocephalic, without obvious abnormality, atraumatic Neck: no adenopathy, supple, symmetrical, trachea midline and thyroid normal to inspection and palpation Lungs: clear to auscultation bilaterally Cardiovascular: regular rate and rhythm Breasts: normal appearance, no masses or tenderness Abdomen: soft, non-tender; non distended,  no masses,  no organomegaly Extremities: extremities normal, atraumatic, no cyanosis or edema Skin: Skin color, texture, turgor normal. No rashes or lesions Lymph nodes: Cervical, supraclavicular, and axillary nodes normal. No abnormal inguinal nodes palpated Neurologic: Grossly normal   Pelvic: External genitalia:  no lesions, clitoris not visualized, tissue fused in the midline              Urethra:  normal appearing urethra with no masses, tenderness or lesions  Bartholins and Skenes: normal                 Vagina: normal appearing vagina with normal color and discharge, no lesions, grade 1-2 rectocele (not symptomatic)              Cervix: no lesions               Bimanual Exam:  Uterus:  normal size, contour, position, consistency, mobility, non-tender and retroverted              Adnexa: no mass, fullness, tenderness               Rectovaginal: Confirms               Anus:  normal sphincter tone, no lesions  Arnold Long chaperoned for the exam.  A:  Well Woman with normal exam  HIV +, doing well on  medication  Rectocele, not symptomatic. Information given. Avoid heavy lifting and straining  P:   Pap with hpv  Labs with primary  Discussed breast self exam  Discussed calcium and vit D intake  Continue Depo-Provera

## 2020-01-01 ENCOUNTER — Encounter: Payer: Self-pay | Admitting: Obstetrics and Gynecology

## 2020-01-01 ENCOUNTER — Other Ambulatory Visit (HOSPITAL_COMMUNITY)
Admission: RE | Admit: 2020-01-01 | Discharge: 2020-01-01 | Disposition: A | Discharge status: Home / Self Care | Payer: BC Managed Care – PPO | Source: Ambulatory Visit | Attending: Obstetrics and Gynecology | Admitting: Obstetrics and Gynecology

## 2020-01-01 ENCOUNTER — Ambulatory Visit (INDEPENDENT_AMBULATORY_CARE_PROVIDER_SITE_OTHER): Payer: BC Managed Care – PPO | Admitting: Obstetrics and Gynecology

## 2020-01-01 ENCOUNTER — Other Ambulatory Visit: Payer: Self-pay

## 2020-01-01 VITALS — BP 118/62 | HR 84 | Temp 98.2°F | Ht 65.0 in | Wt 174.0 lb

## 2020-01-01 DIAGNOSIS — Z21 Asymptomatic human immunodeficiency virus [HIV] infection status: Secondary | ICD-10-CM | POA: Diagnosis not present

## 2020-01-01 DIAGNOSIS — Z124 Encounter for screening for malignant neoplasm of cervix: Secondary | ICD-10-CM | POA: Diagnosis not present

## 2020-01-01 DIAGNOSIS — Z3042 Encounter for surveillance of injectable contraceptive: Secondary | ICD-10-CM | POA: Diagnosis not present

## 2020-01-01 DIAGNOSIS — Z01419 Encounter for gynecological examination (general) (routine) without abnormal findings: Secondary | ICD-10-CM

## 2020-01-01 DIAGNOSIS — N816 Rectocele: Secondary | ICD-10-CM

## 2020-01-01 NOTE — Patient Instructions (Signed)
EXERCISE AND DIET:  We recommended that you start or continue a regular exercise program for good health. Regular exercise means any activity that makes your heart beat faster and makes you sweat.  We recommend exercising at least 30 minutes per day at least 3 days a week, preferably 4 or 5.  We also recommend a diet low in fat and sugar.  Inactivity, poor dietary choices and obesity can cause diabetes, heart attack, stroke, and kidney damage, among others.   ° °ALCOHOL AND SMOKING:  Women should limit their alcohol intake to no more than 7 drinks/beers/glasses of wine (combined, not each!) per week. Moderation of alcohol intake to this level decreases your risk of breast cancer and liver damage. And of course, no recreational drugs are part of a healthy lifestyle.  And absolutely no smoking or even second hand smoke. Most people know smoking can cause heart and lung diseases, but did you know it also contributes to weakening of your bones? Aging of your skin?  Yellowing of your teeth and nails? ° °CALCIUM AND VITAMIN D:  Adequate intake of calcium and Vitamin D are recommended.  The recommendations for exact amounts of these supplements seem to change often, but generally speaking 1,000 mg of calcium (between diet and supplement) and 800 units of Vitamin D per day seems prudent. Certain women may benefit from higher intake of Vitamin D.  If you are among these women, your doctor will have told you during your visit.   ° °PAP SMEARS:  Pap smears, to check for cervical cancer or precancers,  have traditionally been done yearly, although recent scientific advances have shown that most women can have pap smears less often.  However, every woman still should have a physical exam from her gynecologist every year. It will include a breast check, inspection of the vulva and vagina to check for abnormal growths or skin changes, a visual exam of the cervix, and then an exam to evaluate the size and shape of the uterus and  ovaries.  And after 40 years of age, a rectal exam is indicated to check for rectal cancers. We will also provide age appropriate advice regarding health maintenance, like when you should have certain vaccines, screening for sexually transmitted diseases, bone density testing, colonoscopy, mammograms, etc.  ° °MAMMOGRAMS:  All women over 40 years old should have a yearly mammogram. Many facilities now offer a "3D" mammogram, which may cost around $50 extra out of pocket. If possible,  we recommend you accept the option to have the 3D mammogram performed.  It both reduces the number of women who will be called back for extra views which then turn out to be normal, and it is better than the routine mammogram at detecting truly abnormal areas.   ° °COLON CANCER SCREENING: Now recommend starting at age 45. At this time colonoscopy is not covered for routine screening until 50. There are take home tests that can be done between 45-49.  ° °COLONOSCOPY:  Colonoscopy to screen for colon cancer is recommended for all women at age 50.  We know, you hate the idea of the prep.  We agree, BUT, having colon cancer and not knowing it is worse!!  Colon cancer so often starts as a polyp that can be seen and removed at colonscopy, which can quite literally save your life!  And if your first colonoscopy is normal and you have no family history of colon cancer, most women don't have to have it again for   10 years.  Once every ten years, you can do something that may end up saving your life, right?  We will be happy to help you get it scheduled when you are ready.  Be sure to check your insurance coverage so you understand how much it will cost.  It may be covered as a preventative service at no cost, but you should check your particular policy.   ° ° ° °Breast Self-Awareness °Breast self-awareness means being familiar with how your breasts look and feel. It involves checking your breasts regularly and reporting any changes to your  health care provider. °Practicing breast self-awareness is important. A change in your breasts can be a sign of a serious medical problem. Being familiar with how your breasts look and feel allows you to find any problems early, when treatment is more likely to be successful. All women should practice breast self-awareness, including women who have had breast implants. °How to do a breast self-exam °One way to learn what is normal for your breasts and whether your breasts are changing is to do a breast self-exam. To do a breast self-exam: °Look for Changes ° °1. Remove all the clothing above your waist. °2. Stand in front of a mirror in a room with good lighting. °3. Put your hands on your hips. °4. Push your hands firmly downward. °5. Compare your breasts in the mirror. Look for differences between them (asymmetry), such as: °? Differences in shape. °? Differences in size. °? Puckers, dips, and bumps in one breast and not the other. °6. Look at each breast for changes in your skin, such as: °? Redness. °? Scaly areas. °7. Look for changes in your nipples, such as: °? Discharge. °? Bleeding. °? Dimpling. °? Redness. °? A change in position. °Feel for Changes °Carefully feel your breasts for lumps and changes. It is best to do this while lying on your back on the floor and again while sitting or standing in the shower or tub with soapy water on your skin. Feel each breast in the following way: °· Place the arm on the side of the breast you are examining above your head. °· Feel your breast with the other hand. °· Start in the nipple area and make ¾ inch (2 cm) overlapping circles to feel your breast. Use the pads of your three middle fingers to do this. Apply light pressure, then medium pressure, then firm pressure. The light pressure will allow you to feel the tissue closest to the skin. The medium pressure will allow you to feel the tissue that is a little deeper. The firm pressure will allow you to feel the tissue  close to the ribs. °· Continue the overlapping circles, moving downward over the breast until you feel your ribs below your breast. °· Move one finger-width toward the center of the body. Continue to use the ¾ inch (2 cm) overlapping circles to feel your breast as you move slowly up toward your collarbone. °· Continue the up and down exam using all three pressures until you reach your armpit. ° °Write Down What You Find ° °Write down what is normal for each breast and any changes that you find. Keep a written record with breast changes or normal findings for each breast. By writing this information down, you do not need to depend only on memory for size, tenderness, or location. Write down where you are in your menstrual cycle, if you are still menstruating. °If you are having trouble noticing differences   in your breasts, do not get discouraged. With time you will become more familiar with the variations in your breasts and more comfortable with the exam. °How often should I examine my breasts? °Examine your breasts every month. If you are breastfeeding, the best time to examine your breasts is after a feeding or after using a breast pump. If you menstruate, the best time to examine your breasts is 5-7 days after your period is over. During your period, your breasts are lumpier, and it may be more difficult to notice changes. °When should I see my health care provider? °See your health care provider if you notice: °· A change in shape or size of your breasts or nipples. °· A change in the skin of your breast or nipples, such as a reddened or scaly area. °· Unusual discharge from your nipples. °· A lump or thick area that was not there before. °· Pain in your breasts. °· Anything that concerns you. ° °About Rectocele ° °Overview ° °A rectocele is a type of hernia which causes different degrees of bulging of the rectal tissues into the vaginal wall.  You may even notice that it presses against the vaginal wall so much  that some vaginal tissues droop outside of the opening of your vagina. ° °Causes of Rectocele ° °The most common cause is childbirth.  The muscles and ligaments in the pelvis that hold up and support the female organs and vagina become stretched and weakened during labor and delivery.  The more babies you have, the more the support tissues are stretched and weakened.  Not everyone who has a baby will develop a rectocele.  Some women have stronger supporting tissue in the pelvis and may not have as much of a problem as others.  Women who have a Cesarean section usually do not get rectocele's unless they pushed a long time prior to the cesarean delivery. ° °Other conditions that can cause a rectocele include chronic constipation, a chronic cough, a lot of heavy lifting, and obesity.  Older women may have this problem because the loss of female hormones causes the vaginal tissue to become weaker. ° °Symptoms ° °There may not be any symptoms.  If you do have symptoms, they may include: °· Pelvic pressure in the rectal area °· Protrusion of the lower part of the vagina through the opening of the vagina °· Constipation and trapping of the stool, making it difficult to have a bowel movement.  In severe cases, you may have to press on the lower part of your vagina to help push the stool out of you rectum.  This is called splinting to empty. ° °Diagnosing Rectocele ° °Your health care provider will ask about your symptoms and perform a pelvic exam.  S/he will ask you to bear down, pushing like you are having a bowel movement so as to see how far the lower part of the vagina protrudes into the vagina and possible outside of the vagina.  Your provider will also ask you to contract the muscles of your pelvis (like you are stopping the stream in the middle of urinating) to determine the strength of your pelvic muscles.  Your provider may also do a rectal exam. ° °Treatment Options ° °If you do not have any symptoms, no treatment  may be necessary.  Other treatment options include: °· Pelvic floor exercises: Contracting the muscles in your genital area may help strengthen your muscles and support the organs.  Be sure to get   proper exercise instruction from you physical therapist. °· A pessary (removealbe pelvic support device) sometimes helps rectocele symptoms. °· Surgery: Surgical repair may be necessary. In some cases the uterus may need to be taken out ( a hysterectomy) as well.  There are many types of surgery for pelvic support problems.  Look for physicians who specialize in repair procedures. ° °You can take care of yourself by: °· Treating and preventing constipation °· Avoiding heavy lifting, and lifting correctly (with your legs, not with you waist or back) °· Treating a chronic cough or bronchitis °· Not smoking °· avoiding too much weight gain °· Doing pelvic floor exercises ° °© 2007, [employer] °

## 2020-01-03 LAB — CYTOLOGY - PAP
Comment: NEGATIVE
Diagnosis: NEGATIVE
Diagnosis: REACTIVE
High risk HPV: NEGATIVE

## 2020-01-06 ENCOUNTER — Telehealth: Payer: Self-pay

## 2020-01-06 NOTE — Telephone Encounter (Signed)
Call Made to patient. Unable to leave message. Mailbox is full.

## 2020-01-17 ENCOUNTER — Other Ambulatory Visit: Payer: Self-pay | Admitting: Internal Medicine

## 2020-01-17 DIAGNOSIS — B20 Human immunodeficiency virus [HIV] disease: Secondary | ICD-10-CM

## 2020-01-21 MED FILL — TIVICAY 50 MG TABLET: 50 | 30 days supply | Qty: 30 | Fill #0

## 2020-01-21 MED FILL — DESCOVY 200-25 MG TABS: 200-25 | 30 days supply | Qty: 30 | Fill #0

## 2020-01-23 NOTE — Telephone Encounter (Signed)
Spoke to patient. Results given.  Encounter closed  

## 2020-02-17 MED FILL — DESCOVY 200-25 MG TABS: 200-25 | 30 days supply | Qty: 30 | Fill #1

## 2020-02-17 MED FILL — TIVICAY 50 MG TABLET: 50 | 30 days supply | Qty: 30 | Fill #1

## 2020-03-02 ENCOUNTER — Ambulatory Visit: Payer: BC Managed Care – PPO

## 2020-03-09 ENCOUNTER — Ambulatory Visit (INDEPENDENT_AMBULATORY_CARE_PROVIDER_SITE_OTHER): Payer: BC Managed Care – PPO | Admitting: *Deleted

## 2020-03-09 ENCOUNTER — Other Ambulatory Visit: Payer: Self-pay

## 2020-03-09 VITALS — BP 120/70 | HR 66 | Temp 97.3°F | Resp 14 | Ht 64.0 in | Wt 178.4 lb

## 2020-03-09 DIAGNOSIS — Z3042 Encounter for surveillance of injectable contraceptive: Secondary | ICD-10-CM | POA: Diagnosis not present

## 2020-03-09 MED ORDER — MEDROXYPROGESTERONE ACETATE 150 MG/ML IM SUSP
150.0000 mg | Freq: Once | INTRAMUSCULAR | Status: AC
  Administered 2020-03-09: 150 mg via INTRAMUSCULAR

## 2020-03-09 NOTE — Progress Notes (Signed)
Patient is here for Depo Provera Injection Patient is within Depo Provera Calender Limits yes, last given 12-16-19  Next Depo Due between: 8-2 to 8-16  Last AEX: 01-01-20 JJ  AEX Scheduled: 01-06-21  Patient is aware when next depo is due  Pt tolerated Injection well in LUOQ.   Routed to provider for review, encounter closed.

## 2020-03-16 MED FILL — DESCOVY 200-25 MG TABS: 200-25 | 30 days supply | Qty: 30 | Fill #2

## 2020-03-16 MED FILL — TIVICAY 50 MG TABLET: 50 | 30 days supply | Qty: 30 | Fill #2

## 2020-04-07 ENCOUNTER — Other Ambulatory Visit: Payer: Self-pay | Admitting: *Deleted

## 2020-04-07 DIAGNOSIS — B20 Human immunodeficiency virus [HIV] disease: Secondary | ICD-10-CM

## 2020-04-07 MED ORDER — DESCOVY 200-25 MG PO TABS: 1.0000 | ORAL_TABLET | Freq: Every day | ORAL | 5 refills | Status: DC

## 2020-04-07 MED ORDER — TIVICAY 50 MG PO TABS: ORAL_TABLET | ORAL | 5 refills | Status: DC

## 2020-04-13 MED FILL — TIVICAY 50 MG TABLET: 50 | 30 days supply | Qty: 30 | Fill #0

## 2020-04-13 MED FILL — DESCOVY 200-25 MG TABS: 200-25 | 30 days supply | Qty: 30 | Fill #0

## 2020-05-12 ENCOUNTER — Other Ambulatory Visit: Payer: Self-pay

## 2020-05-12 ENCOUNTER — Other Ambulatory Visit: Payer: BC Managed Care – PPO

## 2020-05-12 DIAGNOSIS — B20 Human immunodeficiency virus [HIV] disease: Secondary | ICD-10-CM

## 2020-05-12 DIAGNOSIS — Z113 Encounter for screening for infections with a predominantly sexual mode of transmission: Secondary | ICD-10-CM

## 2020-05-12 DIAGNOSIS — Z79899 Other long term (current) drug therapy: Secondary | ICD-10-CM | POA: Diagnosis not present

## 2020-05-12 MED FILL — TIVICAY 50 MG TABLET: 50 | 30 days supply | Qty: 30 | Fill #1

## 2020-05-12 MED FILL — DESCOVY 200-25 MG TABS: 200-25 | 30 days supply | Qty: 30 | Fill #1

## 2020-05-13 LAB — COMPLETE METABOLIC PANEL WITH GFR
AG Ratio: 1.1 (calc) (ref 1.0–2.5)
ALT: 13 U/L (ref 6–29)
AST: 15 U/L (ref 10–30)
Albumin: 4.1 g/dL (ref 3.6–5.1)
Alkaline phosphatase (APISO): 58 U/L (ref 31–125)
BUN: 12 mg/dL (ref 7–25)
CO2: 24 mmol/L (ref 20–32)
Calcium: 9.2 mg/dL (ref 8.6–10.2)
Chloride: 102 mmol/L (ref 98–110)
Creat: 1.03 mg/dL (ref 0.50–1.10)
GFR, Est African American: 79 mL/min/{1.73_m2} (ref >=60)
GFR, Est Non African American: 68 mL/min/{1.73_m2} (ref >=60)
Globulin: 3.8 g/dL (calc) — ABNORMAL HIGH (ref 1.9–3.7)
Glucose, Bld: 86 mg/dL (ref 65–99)
Potassium: 3.5 mmol/L (ref 3.5–5.3)
Sodium: 136 mmol/L (ref 135–146)
Total Bilirubin: 0.2 mg/dL (ref 0.2–1.2)
Total Protein: 7.9 g/dL (ref 6.1–8.1)

## 2020-05-13 LAB — T-HELPER CELL (CD4) - (RCID CLINIC ONLY)
CD4 % Helper T Cell: 30 % — ABNORMAL LOW (ref 33–65)
CD4 T Cell Abs: 451 /uL (ref 400–1790)

## 2020-05-22 LAB — CBC WITH DIFFERENTIAL/PLATELET
Absolute Monocytes: 592 cells/uL (ref 200–950)
Basophils Absolute: 28 cells/uL (ref 0–200)
Basophils Relative: 0.6 %
Eosinophils Absolute: 122 cells/uL (ref 15–500)
Eosinophils Relative: 2.6 %
HCT: 34.5 % — ABNORMAL LOW (ref 35.0–45.0)
Hemoglobin: 11.5 g/dL — ABNORMAL LOW (ref 11.7–15.5)
Lymphs Abs: 1405 cells/uL (ref 850–3900)
MCH: 28.4 pg (ref 27.0–33.0)
MCHC: 33.3 g/dL (ref 32.0–36.0)
MCV: 85.2 fL (ref 80.0–100.0)
MPV: 10.3 fL (ref 7.5–12.5)
Monocytes Relative: 12.6 %
Neutro Abs: 2552 cells/uL (ref 1500–7800)
Neutrophils Relative %: 54.3 %
Platelets: 217 10*3/uL (ref 140–400)
RBC: 4.05 10*6/uL (ref 3.80–5.10)
RDW: 12.9 % (ref 11.0–15.0)
Total Lymphocyte: 29.9 %
WBC: 4.7 10*3/uL (ref 3.8–10.8)

## 2020-05-22 LAB — LIPID PANEL
Cholesterol: 245 mg/dL — ABNORMAL HIGH (ref <200)
HDL: 61 mg/dL (ref >=50)
LDL Cholesterol (Calc): 164 mg/dL (calc) — ABNORMAL HIGH
Non-HDL Cholesterol (Calc): 184 mg/dL (calc) — ABNORMAL HIGH (ref <130)
Total CHOL/HDL Ratio: 4 (calc) (ref <5.0)
Triglycerides: 90 mg/dL (ref <150)

## 2020-05-22 LAB — HIV-1 RNA QUANT-NO REFLEX-BLD
HIV 1 RNA Quant: <20 copies/mL
HIV-1 RNA Quant, Log: <1.3 Log copies/mL

## 2020-05-22 LAB — RPR: RPR Ser Ql: NONREACTIVE

## 2020-05-25 ENCOUNTER — Ambulatory Visit: Payer: BC Managed Care – PPO

## 2020-05-27 ENCOUNTER — Other Ambulatory Visit: Payer: Self-pay

## 2020-05-27 ENCOUNTER — Encounter: Payer: Self-pay | Admitting: Internal Medicine

## 2020-05-27 ENCOUNTER — Ambulatory Visit: Payer: BC Managed Care – PPO | Admitting: Internal Medicine

## 2020-05-27 VITALS — BP 127/84 | HR 66 | Wt 174.0 lb

## 2020-05-27 DIAGNOSIS — Z79899 Other long term (current) drug therapy: Secondary | ICD-10-CM

## 2020-05-27 DIAGNOSIS — N289 Disorder of kidney and ureter, unspecified: Secondary | ICD-10-CM | POA: Diagnosis not present

## 2020-05-27 DIAGNOSIS — Z113 Encounter for screening for infections with a predominantly sexual mode of transmission: Secondary | ICD-10-CM

## 2020-05-27 DIAGNOSIS — Z23 Encounter for immunization: Secondary | ICD-10-CM

## 2020-05-27 DIAGNOSIS — B20 Human immunodeficiency virus [HIV] disease: Secondary | ICD-10-CM

## 2020-05-27 NOTE — Assessment & Plan Note (Signed)
She continues to do very well, no issues and rtc in 6 months.

## 2020-05-27 NOTE — Assessment & Plan Note (Signed)
Will give her pneumovax 23 today

## 2020-05-27 NOTE — Assessment & Plan Note (Signed)
Lipid panel noted and LDL elevated at 164.  No history of heart disease so will monitor, she can continue lifestyle modifications.

## 2020-05-27 NOTE — Assessment & Plan Note (Signed)
Screened negative 

## 2020-05-27 NOTE — Assessment & Plan Note (Signed)
Her creat has been very stable, normal.  Will continue to monitor.

## 2020-05-27 NOTE — Progress Notes (Signed)
   Subjective:    Patient ID: Allison Wilson, female    DOB: 06-22-1981, 39 y.o.   MRN: [id number]  HPI Here for follow up of HIV She continues on Tivicay and descovy and no missed doses.  CD4 451, viral load < 20.  Creat, LFTs wnl.  She has no complaints today.     Review of Systems  Constitutional: Negative for fatigue and fever.  Gastrointestinal: Negative for diarrhea.  Skin: Negative for rash.       Objective:   Physical Exam Constitutional:      Appearance: She is well-developed.  Eyes:     General: No scleral icterus. Cardiovascular:     Rate and Rhythm: Normal rate.  Pulmonary:     Effort: Pulmonary effort is normal. No respiratory distress.  Skin:    Findings: No rash.  Psychiatric:        Mood and Affect: Mood normal.    SH: no tobacco       Assessment & Plan:

## 2020-06-01 ENCOUNTER — Ambulatory Visit (INDEPENDENT_AMBULATORY_CARE_PROVIDER_SITE_OTHER): Payer: BC Managed Care – PPO

## 2020-06-01 ENCOUNTER — Other Ambulatory Visit: Payer: Self-pay

## 2020-06-01 VITALS — BP 118/64 | HR 80 | Resp 12 | Ht 64.0 in | Wt 176.0 lb

## 2020-06-01 DIAGNOSIS — Z3042 Encounter for surveillance of injectable contraceptive: Secondary | ICD-10-CM | POA: Diagnosis not present

## 2020-06-01 MED ORDER — MEDROXYPROGESTERONE ACETATE 150 MG/ML IM SUSP
150.0000 mg | Freq: Once | INTRAMUSCULAR | Status: AC
  Administered 2020-06-01: 150 mg via INTRAMUSCULAR

## 2020-06-01 NOTE — Progress Notes (Signed)
Patient is here for Depo Provera Injection Patient is within Depo Provera Calender Limits 8/2-8/16 Next Depo Due between: 10/25-11/8 Last AEX: 01-12-2020 AEX Scheduled: 01-06-2021 with Dr Oscar La  Patient is aware when next depo is due  Pt tolerated Injection well in LUOQ.  Routed to provider for review, encounter closed.

## 2020-06-10 MED FILL — TIVICAY 50 MG TABLET: 50 | 30 days supply | Qty: 30 | Fill #2

## 2020-06-10 MED FILL — DESCOVY 200-25 MG TABS: 200-25 | 30 days supply | Qty: 30 | Fill #2

## 2020-06-19 DIAGNOSIS — B20 Human immunodeficiency virus [HIV] disease: Secondary | ICD-10-CM | POA: Diagnosis not present

## 2020-06-19 DIAGNOSIS — E785 Hyperlipidemia, unspecified: Secondary | ICD-10-CM | POA: Diagnosis not present

## 2020-06-19 DIAGNOSIS — Z13228 Encounter for screening for other metabolic disorders: Secondary | ICD-10-CM | POA: Diagnosis not present

## 2020-06-24 DIAGNOSIS — I10 Essential (primary) hypertension: Secondary | ICD-10-CM | POA: Diagnosis not present

## 2020-06-24 DIAGNOSIS — Z0001 Encounter for general adult medical examination with abnormal findings: Secondary | ICD-10-CM | POA: Diagnosis not present

## 2020-06-24 DIAGNOSIS — B20 Human immunodeficiency virus [HIV] disease: Secondary | ICD-10-CM | POA: Diagnosis not present

## 2020-06-24 DIAGNOSIS — E785 Hyperlipidemia, unspecified: Secondary | ICD-10-CM | POA: Diagnosis not present

## 2020-07-13 MED FILL — TIVICAY 50 MG TABLET: 50 | 30 days supply | Qty: 30 | Fill #3

## 2020-07-13 MED FILL — DESCOVY 200-25 MG TABS: 200-25 | 30 days supply | Qty: 30 | Fill #3

## 2020-08-11 MED FILL — DESCOVY 200-25 MG TABS: 200-25 | 30 days supply | Qty: 30 | Fill #4

## 2020-08-11 MED FILL — TIVICAY 50 MG TABLET: 50 | 30 days supply | Qty: 30 | Fill #4

## 2020-08-17 ENCOUNTER — Ambulatory Visit: Payer: BC Managed Care – PPO

## 2020-08-17 NOTE — Progress Notes (Deleted)
Patient is here for Depo Provera Injection Patient is within Depo Provera Calender Limits yes Next Depo Due between: 1/10-1/24 Last AEX: 01/01/20 Dr. Maryclare Bean Scheduled: 01/06/21  Patient is aware when next depo is due  Pt tolerated Injection well in RUOQ.  Routed to provider for review, encounter closed.

## 2020-08-24 ENCOUNTER — Other Ambulatory Visit: Payer: Self-pay

## 2020-08-24 ENCOUNTER — Ambulatory Visit (INDEPENDENT_AMBULATORY_CARE_PROVIDER_SITE_OTHER): Payer: BC Managed Care – PPO

## 2020-08-24 VITALS — BP 138/80 | HR 76 | Resp 10 | Ht 64.0 in | Wt 171.0 lb

## 2020-08-24 DIAGNOSIS — Z3042 Encounter for surveillance of injectable contraceptive: Secondary | ICD-10-CM | POA: Diagnosis not present

## 2020-08-24 MED ORDER — MEDROXYPROGESTERONE ACETATE 150 MG/ML IM SUSP
150.0000 mg | Freq: Once | INTRAMUSCULAR | Status: AC
  Administered 2020-08-24: 150 mg via INTRAMUSCULAR

## 2020-08-24 NOTE — Addendum Note (Signed)
Addended by: Julious Payer C on: 08/24/2020 02:58 PM   Modules accepted: Orders

## 2020-08-24 NOTE — Progress Notes (Signed)
Patient is here for Depo Provera Injection Patient is within Depo Provera Calender Limits yes, last given 06-01-20  Next Depo Due between: 11-09-20 to 11-23-20  Last AEX: 01-01-20 JJ  AEX Scheduled: 01-06-21   Patient is aware when next depo is due  Pt tolerated Injection well in RUOQ.   Routed to provider for review, encounter closed.

## 2020-09-10 MED FILL — DESCOVY 200-25 MG TABS: 200-25 | 30 days supply | Qty: 30 | Fill #5

## 2020-09-10 MED FILL — TIVICAY 50 MG TABLET: 50 | 30 days supply | Qty: 30 | Fill #5

## 2020-09-30 ENCOUNTER — Other Ambulatory Visit: Payer: Self-pay | Admitting: Internal Medicine

## 2020-09-30 DIAGNOSIS — B20 Human immunodeficiency virus [HIV] disease: Secondary | ICD-10-CM

## 2020-10-06 MED FILL — TIVICAY 50 MG TABLET: 50 | 30 days supply | Qty: 30 | Fill #0

## 2020-10-06 MED FILL — DESCOVY 200-25 MG TABS: 200-25 | 30 days supply | Qty: 30 | Fill #0

## 2020-11-02 MED FILL — DESCOVY 200-25 MG TABS: 200-25 | 30 days supply | Qty: 30 | Fill #1

## 2020-11-02 MED FILL — TIVICAY 50 MG TABLET: 50 | 30 days supply | Qty: 30 | Fill #1

## 2020-11-09 ENCOUNTER — Ambulatory Visit: Payer: BC Managed Care – PPO

## 2020-11-10 ENCOUNTER — Ambulatory Visit: Payer: BC Managed Care – PPO

## 2020-11-13 ENCOUNTER — Other Ambulatory Visit: Payer: Self-pay

## 2020-11-13 ENCOUNTER — Ambulatory Visit (INDEPENDENT_AMBULATORY_CARE_PROVIDER_SITE_OTHER): Payer: BC Managed Care – PPO | Admitting: *Deleted

## 2020-11-13 VITALS — BP 118/68 | HR 68 | Resp 16 | Wt 175.0 lb

## 2020-11-13 DIAGNOSIS — Z3042 Encounter for surveillance of injectable contraceptive: Secondary | ICD-10-CM

## 2020-11-13 MED ORDER — MEDROXYPROGESTERONE ACETATE 150 MG/ML IM SUSP
150.0000 mg | Freq: Once | INTRAMUSCULAR | Status: AC
  Administered 2020-11-13: 150 mg via INTRAMUSCULAR

## 2020-11-13 NOTE — Progress Notes (Signed)
Patient is here for Depo Provera Injection Patient is within Depo Provera Calender Limits jan 17th-jan31st Next Depo Due between: April 8th-April 22nd Last AEX: 01-01-20 Lillette Boxer Scheduled: 01-06-2021  Patient is aware when next depo is due  Pt tolerated Injection well in LUOQ.  Routed to provider for review, encounter closed.

## 2020-11-30 MED FILL — TIVICAY 50 MG TABLET: 50 | 30 days supply | Qty: 30 | Fill #2

## 2020-11-30 MED FILL — DESCOVY 200-25 MG TABS: 200-25 | 30 days supply | Qty: 30 | Fill #2

## 2020-12-02 ENCOUNTER — Ambulatory Visit: Payer: BC Managed Care – PPO | Admitting: Internal Medicine

## 2020-12-02 ENCOUNTER — Encounter: Payer: Self-pay | Admitting: Internal Medicine

## 2020-12-02 ENCOUNTER — Other Ambulatory Visit: Payer: Self-pay

## 2020-12-02 VITALS — BP 105/70 | HR 82 | Temp 98.0°F | Resp 16 | Ht 64.0 in | Wt 172.0 lb

## 2020-12-02 DIAGNOSIS — R197 Diarrhea, unspecified: Secondary | ICD-10-CM

## 2020-12-02 DIAGNOSIS — B20 Human immunodeficiency virus [HIV] disease: Secondary | ICD-10-CM | POA: Diagnosis not present

## 2020-12-02 NOTE — Assessment & Plan Note (Signed)
Recent issues after travel back home but has resolved.

## 2020-12-02 NOTE — Progress Notes (Signed)
   Subjective:    Patient ID: Allison Wilson, female    DOB: November 02, 1980, 40 y.o.   MRN: [id number]  HPI Here for follow up of HIV She continues on Tivicay and Descovy.  No issues getting or taking the medication.  No missed doses.  Has had the COVID booster.  Recently traveled back to [place] and had some diarrhea that has since resolved.  No other issues.     Review of Systems  Constitutional: Negative for unexpected weight change.  Gastrointestinal: Negative for diarrhea and nausea.  Skin: Negative for rash.       Objective:   Physical Exam Constitutional:      Appearance: Normal appearance.  Eyes:     General: No scleral icterus. Cardiovascular:     Rate and Rhythm: Normal rate and regular rhythm.  Pulmonary:     Effort: Pulmonary effort is normal.  Neurological:     General: No focal deficit present.     Mental Status: She is alert.  Psychiatric:        Mood and Affect: Mood normal.           Assessment & Plan:

## 2020-12-02 NOTE — Assessment & Plan Note (Signed)
She continues to do well with no issues.  Labs today and rtc in 6 months.

## 2020-12-03 LAB — T-HELPER CELL (CD4) - (RCID CLINIC ONLY)
CD4 % Helper T Cell: 28 % — ABNORMAL LOW (ref 33–65)
CD4 T Cell Abs: 423 /uL (ref 400–1790)

## 2020-12-05 LAB — HIV-1 RNA QUANT-NO REFLEX-BLD
HIV 1 RNA Quant: <20 Copies/mL
HIV-1 RNA Quant, Log: <1.3 Log cps/mL

## 2020-12-28 MED FILL — DESCOVY 200-25 MG TABS: 200-25 | 30 days supply | Qty: 30 | Fill #3

## 2020-12-28 MED FILL — TIVICAY 50 MG TABLET: 50 | 30 days supply | Qty: 30 | Fill #3

## 2021-01-06 ENCOUNTER — Other Ambulatory Visit: Payer: Self-pay

## 2021-01-06 ENCOUNTER — Encounter: Payer: Self-pay | Admitting: Internal Medicine

## 2021-01-06 ENCOUNTER — Ambulatory Visit (INDEPENDENT_AMBULATORY_CARE_PROVIDER_SITE_OTHER): Payer: BC Managed Care – PPO | Admitting: Obstetrics and Gynecology

## 2021-01-06 ENCOUNTER — Other Ambulatory Visit: Payer: Self-pay | Admitting: Obstetrics and Gynecology

## 2021-01-06 ENCOUNTER — Encounter: Payer: Self-pay | Admitting: Obstetrics and Gynecology

## 2021-01-06 ENCOUNTER — Other Ambulatory Visit (HOSPITAL_COMMUNITY)
Admission: RE | Admit: 2021-01-06 | Discharge: 2021-01-06 | Disposition: A | Discharge status: Home / Self Care | Payer: BC Managed Care – PPO | Source: Ambulatory Visit | Attending: Obstetrics and Gynecology | Admitting: Obstetrics and Gynecology

## 2021-01-06 VITALS — BP 122/70 | HR 66 | Resp 16 | Ht 64.0 in | Wt 171.0 lb

## 2021-01-06 DIAGNOSIS — Z21 Asymptomatic human immunodeficiency virus [HIV] infection status: Secondary | ICD-10-CM | POA: Diagnosis not present

## 2021-01-06 DIAGNOSIS — Z3042 Encounter for surveillance of injectable contraceptive: Secondary | ICD-10-CM | POA: Diagnosis not present

## 2021-01-06 DIAGNOSIS — Z124 Encounter for screening for malignant neoplasm of cervix: Secondary | ICD-10-CM | POA: Diagnosis not present

## 2021-01-06 DIAGNOSIS — Z01419 Encounter for gynecological examination (general) (routine) without abnormal findings: Secondary | ICD-10-CM

## 2021-01-06 DIAGNOSIS — Z1231 Encounter for screening mammogram for malignant neoplasm of breast: Secondary | ICD-10-CM

## 2021-01-06 NOTE — Patient Instructions (Signed)
Check with your primary about your tetanus immunization.  EXERCISE   We recommended that you start or continue a regular exercise program for good health. Physical activity is anything that gets your body moving, some is better than none. The CDC recommends 150 minutes per week of Moderate-Intensity Aerobic Activity and 2 or more days of Muscle Strengthening Activity.  Benefits of exercise are limitless: helps weight loss/weight maintenance, improves mood and energy, helps with depression and anxiety, improves sleep, tones and strengthens muscles, improves balance, improves bone density, protects from chronic conditions such as heart disease, high blood pressure and diabetes and so much more. To learn more visit: http://kirby-bean.org/  DIET: Good nutrition starts with a healthy diet of fruits, vegetables, whole grains, and lean protein sources. Drink plenty of water for hydration. Minimize empty calories, sodium, sweets. For more information about dietary recommendations visit: [employer]be and https://www.carpenter-henry.info/  ALCOHOL:  Women should limit their alcohol intake to no more than 7 drinks/beers/glasses of wine (combined, not each!) per week. Moderation of alcohol intake to this level decreases your risk of breast cancer and liver damage.  If you are concerned that you may have a problem, or your friends have told you they are concerned about your drinking, there are many resources to help. A well-known program that is free, effective, and available to all people all over the nation is Alcoholics Anonymous.  Check out this site to learn more: [employer]ageBargains.co.za   CALCIUM AND VITAMIN D:  Adequate intake of calcium and Vitamin D are recommended for bone health.  You should be getting between 1000-1200 mg of calcium and 800 units of Vitamin D daily between diet and supplements  PAP SMEARS:  Pap smears, to check  for cervical cancer or precancers,  have traditionally been done yearly, scientific advances have shown that most women can have pap smears less often.  However, every woman still should have a physical exam from her gynecologist every year. It will include a breast check, inspection of the vulva and vagina to check for abnormal growths or skin changes, a visual exam of the cervix, and then an exam to evaluate the size and shape of the uterus and ovaries. We will also provide age appropriate advice regarding health maintenance, like when you should have certain vaccines, screening for sexually transmitted diseases, bone density testing, colonoscopy, mammograms, etc.   MAMMOGRAMS:  All women over 80 years old should have a routine mammogram.   COLON CANCER SCREENING: Now recommend starting at age 30. At this time colonoscopy is not covered for routine screening until 50. There are take home tests that can be done between 45-49.   COLONOSCOPY:  Colonoscopy to screen for colon cancer is recommended for all women at age 18.  We know, you hate the idea of the prep.  We agree, BUT, having colon cancer and not knowing it is worse!!  Colon cancer so often starts as a polyp that can be seen and removed at colonscopy, which can quite literally save your life!  And if your first colonoscopy is normal and you have no family history of colon cancer, most women don't have to have it again for 10 years.  Once every ten years, you can do something that may end up saving your life, right?  We will be happy to help you get it scheduled when you are ready.  Be sure to check your insurance coverage so you understand how much it will cost.  It may be covered as a  preventative service at no cost, but you should check your particular policy.      Breast Self-Awareness Breast self-awareness means being familiar with how your breasts look and feel. It involves checking your breasts regularly and reporting any changes to your  health care provider. Practicing breast self-awareness is important. A change in your breasts can be a sign of a serious medical problem. Being familiar with how your breasts look and feel allows you to find any problems early, when treatment is more likely to be successful. All women should practice breast self-awareness, including women who have had breast implants. How to do a breast self-exam One way to learn what is normal for your breasts and whether your breasts are changing is to do a breast self-exam. To do a breast self-exam: Look for Changes  1. Remove all the clothing above your waist. 2. Stand in front of a mirror in a room with good lighting. 3. Put your hands on your hips. 4. Push your hands firmly downward. 5. Compare your breasts in the mirror. Look for differences between them (asymmetry), such as: ? Differences in shape. ? Differences in size. ? Puckers, dips, and bumps in one breast and not the other. 6. Look at each breast for changes in your skin, such as: ? Redness. ? Scaly areas. 7. Look for changes in your nipples, such as: ? Discharge. ? Bleeding. ? Dimpling. ? Redness. ? A change in position. Feel for Changes Carefully feel your breasts for lumps and changes. It is best to do this while lying on your back on the floor and again while sitting or standing in the shower or tub with soapy water on your skin. Feel each breast in the following way:  Place the arm on the side of the breast you are examining above your head.  Feel your breast with the other hand.  Start in the nipple area and make  inch (2 cm) overlapping circles to feel your breast. Use the pads of your three middle fingers to do this. Apply light pressure, then medium pressure, then firm pressure. The light pressure will allow you to feel the tissue closest to the skin. The medium pressure will allow you to feel the tissue that is a little deeper. The firm pressure will allow you to feel the tissue  close to the ribs.  Continue the overlapping circles, moving downward over the breast until you feel your ribs below your breast.  Move one finger-width toward the center of the body. Continue to use the  inch (2 cm) overlapping circles to feel your breast as you move slowly up toward your collarbone.  Continue the up and down exam using all three pressures until you reach your armpit.  Write Down What You Find  Write down what is normal for each breast and any changes that you find. Keep a written record with breast changes or normal findings for each breast. By writing this information down, you do not need to depend only on memory for size, tenderness, or location. Write down where you are in your menstrual cycle, if you are still menstruating. If you are having trouble noticing differences in your breasts, do not get discouraged. With time you will become more familiar with the variations in your breasts and more comfortable with the exam. How often should I examine my breasts? Examine your breasts every month. If you are breastfeeding, the best time to examine your breasts is after a feeding or after using a  breast pump. If you menstruate, the best time to examine your breasts is 5-7 days after your period is over. During your period, your breasts are lumpier, and it may be more difficult to notice changes. When should I see my health care provider? See your health care provider if you notice:  A change in shape or size of your breasts or nipples.  A change in the skin of your breast or nipples, such as a reddened or scaly area.  Unusual discharge from your nipples.  A lump or thick area that was not there before.  Pain in your breasts.  Anything that concerns you.

## 2021-01-06 NOTE — Progress Notes (Signed)
40 y.o. G1P1 Married Black or [place] American Not Hispanic or Latino female here for annual exam.   On Depo-provera, no bleeding. No dyspareunia.   HIV well controlled on medication  No bowel or bladder c/o.     No LMP recorded. Patient has had an injection.          Sexually active: Yes.    The current method of family planning is Depo-Provera injections.    Exercising: Yes.    walking  Smoker:  no  Health Maintenance: Pap:  11/01/2018 negative HR HPV Neg  05/04/18 neg HR HPV NEG, 2018 LSIL History of abnormal Pap:  yes MMG:  None  BMD:   None  Colonoscopy: none  TDaP:  2008 Gardasil: complete    reports that she has never smoked. She has never used smokeless tobacco. She reports that she does not drink alcohol and does not use drugs. Works in Stage manager. Daughter is Holiday representative at [employer].   Past Medical History:  Diagnosis Date  . Anemia   . Hepatitis A   . HIV (human immunodeficiency virus infection) (HCC)   . Hypertension     History reviewed. No pertinent surgical history.  Current Outpatient Medications  Medication Sig Dispense Refill  . carvedilol (COREG) 6.25 MG tablet     . DESCOVY 200-25 MG tablet TAKE 1 TABLET BY MOUTH DAILY. 30 tablet 5  . ferrous sulfate 325 (65 FE) MG tablet Take 1 tablet by mouth daily.    Marland Kitchen lisinopril (PRINIVIL,ZESTRIL) 30 MG tablet lisinopril 30 mg tablet    . TIVICAY 50 MG tablet TAKE 1 TABLET (50 MG TOTAL) BY MOUTH DAILY. 30 tablet 5   No current facility-administered medications for this visit.    History reviewed. No pertinent family history.  Review of Systems  All other systems reviewed and are negative.   Exam:   BP 122/70   Pulse 66   Resp 16   Ht 5\' 4"  (1.626 m)   Wt 171 lb (77.6 kg)   BMI 29.35 kg/m   Weight change: @WEIGHTCHANGE @ Height:   Height: 5\' 4"  (162.6 cm)  Ht Readings from Last 3 Encounters:  01/06/21 5\' 4"  (1.626 m)  12/02/20 5\' 4"  (1.626 m)  08/24/20 5\' 4"  (1.626 m)    General appearance:  alert, cooperative and appears stated age Head: Normocephalic, without obvious abnormality, atraumatic Neck: no adenopathy, supple, symmetrical, trachea midline and thyroid normal to inspection and palpation Lungs: clear to auscultation bilaterally Cardiovascular: regular rate and rhythm Breasts: normal appearance, no masses or tenderness Abdomen: soft, non-tender; non distended,  no masses,  no organomegaly Extremities: extremities normal, atraumatic, no cyanosis or edema Skin: Skin color, texture, turgor normal. No rashes or lesions Lymph nodes: Cervical, supraclavicular, and axillary nodes normal. No abnormal inguinal nodes palpated Neurologic: Grossly normal   Pelvic: External genitalia:  no lesions, clitoris is absent              Urethra:  normal appearing urethra with no masses, tenderness or lesions              Bartholins and Skenes: normal                 Vagina: normal appearing vagina with normal color and discharge, no lesions              Cervix: no lesions               Bimanual Exam:  Uterus:  normal size, contour, position,  consistency, mobility, non-tender, retroverted               Adnexa: no mass, fullness, tenderness               Rectovaginal: Confirms               Anus:  normal sphincter tone, no lesions  Carolynn Serve chaperoned for the exam.  1. Well woman exam Discussed breast self exam Discussed calcium and vit D intake Labs with primary Mammogram # given, she will schedule  2. Screening for cervical cancer If this pap is normal, can space paps to q 3 years - Cytology - PAP with hpv  3. Encounter for surveillance of injectable contraceptive Doing well on depo-provera, continue 150 mg IM q 3 months x 1 year  4. HIV positive (HCC) Well controlled

## 2021-01-08 LAB — CYTOLOGY - PAP
Comment: NEGATIVE
High risk HPV: POSITIVE — AB

## 2021-01-19 ENCOUNTER — Other Ambulatory Visit (HOSPITAL_COMMUNITY): Payer: Self-pay

## 2021-01-23 ENCOUNTER — Other Ambulatory Visit (HOSPITAL_COMMUNITY): Payer: Self-pay

## 2021-01-23 MED FILL — Emtricitabine-Tenofovir Alafenamide Fumarate Tab 200-25 MG: ORAL | 30 days supply | Qty: 30 | Fill #0 | Status: AC

## 2021-01-23 MED FILL — Dolutegravir Sodium Tab 50 MG (Base Equiv): ORAL | 30 days supply | Qty: 30 | Fill #0 | Status: AC

## 2021-01-24 ENCOUNTER — Other Ambulatory Visit (HOSPITAL_COMMUNITY): Payer: Self-pay

## 2021-01-25 ENCOUNTER — Other Ambulatory Visit (HOSPITAL_COMMUNITY): Payer: Self-pay

## 2021-01-29 ENCOUNTER — Ambulatory Visit (INDEPENDENT_AMBULATORY_CARE_PROVIDER_SITE_OTHER): Payer: BC Managed Care – PPO | Admitting: *Deleted

## 2021-01-29 ENCOUNTER — Other Ambulatory Visit: Payer: Self-pay

## 2021-01-29 DIAGNOSIS — Z3042 Encounter for surveillance of injectable contraceptive: Secondary | ICD-10-CM

## 2021-01-29 MED ORDER — MEDROXYPROGESTERONE ACETATE 150 MG/ML IM SUSP
150.0000 mg | Freq: Once | INTRAMUSCULAR | Status: AC
  Administered 2021-01-29: 150 mg via INTRAMUSCULAR

## 2021-02-02 ENCOUNTER — Other Ambulatory Visit: Payer: Self-pay

## 2021-02-02 DIAGNOSIS — R87612 Low grade squamous intraepithelial lesion on cytologic smear of cervix (LGSIL): Secondary | ICD-10-CM

## 2021-02-02 DIAGNOSIS — B977 Papillomavirus as the cause of diseases classified elsewhere: Secondary | ICD-10-CM

## 2021-02-18 ENCOUNTER — Other Ambulatory Visit (HOSPITAL_COMMUNITY): Payer: Self-pay

## 2021-02-18 ENCOUNTER — Ambulatory Visit: Payer: BC Managed Care – PPO | Admitting: Obstetrics and Gynecology

## 2021-02-18 DIAGNOSIS — Z0289 Encounter for other administrative examinations: Secondary | ICD-10-CM

## 2021-02-22 ENCOUNTER — Other Ambulatory Visit (HOSPITAL_COMMUNITY): Payer: Self-pay

## 2021-02-22 MED FILL — Emtricitabine-Tenofovir Alafenamide Fumarate Tab 200-25 MG: ORAL | 30 days supply | Qty: 30 | Fill #1 | Status: AC

## 2021-02-22 MED FILL — Dolutegravir Sodium Tab 50 MG (Base Equiv): ORAL | 30 days supply | Qty: 30 | Fill #1 | Status: AC

## 2021-03-01 ENCOUNTER — Other Ambulatory Visit: Payer: Self-pay

## 2021-03-01 ENCOUNTER — Ambulatory Visit
Admission: RE | Admit: 2021-03-01 | Discharge: 2021-03-01 | Disposition: A | Discharge status: Home / Self Care | Payer: BC Managed Care – PPO | Source: Ambulatory Visit | Attending: Obstetrics and Gynecology | Admitting: Obstetrics and Gynecology

## 2021-03-01 DIAGNOSIS — Z1231 Encounter for screening mammogram for malignant neoplasm of breast: Secondary | ICD-10-CM

## 2021-03-15 DIAGNOSIS — M545 Low back pain, unspecified: Secondary | ICD-10-CM | POA: Diagnosis not present

## 2021-03-15 DIAGNOSIS — R293 Abnormal posture: Secondary | ICD-10-CM | POA: Diagnosis not present

## 2021-03-15 DIAGNOSIS — M542 Cervicalgia: Secondary | ICD-10-CM | POA: Diagnosis not present

## 2021-03-15 DIAGNOSIS — M546 Pain in thoracic spine: Secondary | ICD-10-CM | POA: Diagnosis not present

## 2021-03-19 ENCOUNTER — Other Ambulatory Visit: Payer: Self-pay | Admitting: Internal Medicine

## 2021-03-19 ENCOUNTER — Other Ambulatory Visit (HOSPITAL_COMMUNITY): Payer: Self-pay

## 2021-03-19 DIAGNOSIS — M542 Cervicalgia: Secondary | ICD-10-CM | POA: Diagnosis not present

## 2021-03-19 DIAGNOSIS — B20 Human immunodeficiency virus [HIV] disease: Secondary | ICD-10-CM

## 2021-03-19 DIAGNOSIS — M546 Pain in thoracic spine: Secondary | ICD-10-CM | POA: Diagnosis not present

## 2021-03-19 DIAGNOSIS — M545 Low back pain, unspecified: Secondary | ICD-10-CM | POA: Diagnosis not present

## 2021-03-19 DIAGNOSIS — R293 Abnormal posture: Secondary | ICD-10-CM | POA: Diagnosis not present

## 2021-03-19 MED ORDER — DESCOVY 200-25 MG PO TABS
1.0000 | ORAL_TABLET | Freq: Every day | ORAL | 3 refills | Status: DC
  Filled 2021-03-19 – 2021-04-05 (×2): qty 30, 30d supply, fill #0
  Filled 2021-05-07: qty 30, 30d supply, fill #1
  Filled 2021-06-01: qty 30, 30d supply, fill #2
  Filled 2021-07-07: qty 30, 30d supply, fill #3

## 2021-03-19 MED ORDER — TIVICAY 50 MG PO TABS
ORAL_TABLET | Freq: Every day | ORAL | 3 refills | Status: DC
  Filled 2021-03-19: qty 30, fill #0
  Filled 2021-04-05: qty 30, 30d supply, fill #0
  Filled 2021-05-07: qty 30, 30d supply, fill #1
  Filled 2021-06-01: qty 30, 30d supply, fill #2
  Filled 2021-07-07: qty 30, 30d supply, fill #3

## 2021-04-05 ENCOUNTER — Other Ambulatory Visit (HOSPITAL_COMMUNITY): Payer: Self-pay

## 2021-04-08 ENCOUNTER — Other Ambulatory Visit (HOSPITAL_COMMUNITY): Payer: Self-pay

## 2021-04-12 ENCOUNTER — Ambulatory Visit: Payer: BC Managed Care – PPO | Admitting: Obstetrics and Gynecology

## 2021-04-12 ENCOUNTER — Other Ambulatory Visit: Payer: Self-pay

## 2021-04-30 ENCOUNTER — Ambulatory Visit (INDEPENDENT_AMBULATORY_CARE_PROVIDER_SITE_OTHER): Payer: BC Managed Care – PPO | Admitting: *Deleted

## 2021-04-30 ENCOUNTER — Other Ambulatory Visit: Payer: Self-pay

## 2021-04-30 DIAGNOSIS — Z3042 Encounter for surveillance of injectable contraceptive: Secondary | ICD-10-CM

## 2021-04-30 MED ORDER — MEDROXYPROGESTERONE ACETATE 150 MG/ML IM SUSP
150.0000 mg | Freq: Once | INTRAMUSCULAR | Status: AC
  Administered 2021-04-30: 150 mg via INTRAMUSCULAR

## 2021-05-03 ENCOUNTER — Other Ambulatory Visit (HOSPITAL_COMMUNITY): Payer: Self-pay

## 2021-05-05 ENCOUNTER — Other Ambulatory Visit (HOSPITAL_COMMUNITY): Payer: Self-pay

## 2021-05-07 ENCOUNTER — Other Ambulatory Visit (HOSPITAL_COMMUNITY): Payer: Self-pay

## 2021-05-10 ENCOUNTER — Other Ambulatory Visit (HOSPITAL_COMMUNITY): Payer: Self-pay

## 2021-05-10 ENCOUNTER — Ambulatory Visit: Payer: BC Managed Care – PPO | Admitting: Obstetrics and Gynecology

## 2021-05-10 DIAGNOSIS — Z0289 Encounter for other administrative examinations: Secondary | ICD-10-CM

## 2021-05-10 NOTE — Progress Notes (Deleted)
GYNECOLOGY  VISIT   HPI: 40 y.o.   Married Black or [place] American Not Hispanic or Latino  female   G1P1 with No LMP recorded. Patient has had an injection.   here for  colposcopy procedure    GYNECOLOGIC HISTORY: No LMP recorded. Patient has had an injection. Contraception:depo Menopausal hormone therapy: NA        OB History     Gravida  1   Para  1   Term      Preterm      AB      Living         SAB      IAB      Ectopic      Multiple      Live Births                 Patient Active Problem List   Diagnosis Date Noted   Diarrhea 12/02/2020   Renal insufficiency 10/30/2018   Encounter for long-term (current) use of high-risk medication 04/09/2018   LGSIL on Pap smear of cervix 04/10/2017   HIV disease (HCC) 11/28/2016   Screening examination for venereal disease 12/24/2015   History of Mycobacterium avium intracellulare infection 12/24/2015   Anemia 11/30/2015   Gallbladder disease    Acalculous cholecystitis 11/27/2015   TUBERCULOSIS, LATENT 12/12/2006   Poor dentition 12/12/2006    Past Medical History:  Diagnosis Date   Anemia    Hepatitis A    HIV (human immunodeficiency virus infection) (HCC)    Hypertension     No past surgical history on file.  Current Outpatient Medications  Medication Sig Dispense Refill   carvedilol (COREG) 6.25 MG tablet      dolutegravir (TIVICAY) 50 MG tablet TAKE 1 TABLET (50 MG TOTAL) BY MOUTH DAILY. 30 tablet 3   emtricitabine-tenofovir AF (DESCOVY) 200-25 MG tablet TAKE 1 TABLET BY MOUTH DAILY. 30 tablet 3   ferrous sulfate 325 (65 FE) MG tablet Take 1 tablet by mouth daily.     lisinopril (PRINIVIL,ZESTRIL) 30 MG tablet lisinopril 30 mg tablet     No current facility-administered medications for this visit.     ALLERGIES: Patient has no known allergies.  No family history on file.  Social History   Socioeconomic History   Marital status: Married    Spouse name: Not on file   Number of  children: Not on file   Years of education: Not on file   Highest education level: Not on file  Occupational History   Not on file  Tobacco Use   Smoking status: Never   Smokeless tobacco: Never  Vaping Use   Vaping Use: Never used  Substance and Sexual Activity   Alcohol use: No    Alcohol/week: 0.0 standard drinks   Drug use: No   Sexual activity: Yes    Partners: Male    Birth control/protection: Injection  Other Topics Concern   Not on file  Social History Narrative   Not on file   Social Determinants of Health   Financial Resource Strain: Not on file  Food Insecurity: Not on file  Transportation Needs: Not on file  Physical Activity: Not on file  Stress: Not on file  Social Connections: Not on file  Intimate Partner Violence: Not on file    ROS  PHYSICAL EXAMINATION:    There were no vitals taken for this visit.    General appearance: alert, cooperative and appears stated age Neck: no adenopathy, supple, symmetrical,  trachea midline and thyroid {CHL AMB PHY EX THYROID NORM DEFAULT:[phone]} Breasts: {Exam; breast:13139} Abdomen: soft, non-tender; non distended, no masses,  no organomegaly  Pelvic: External genitalia:  no lesions              Urethra:  normal appearing urethra with no masses, tenderness or lesions              Bartholins and Skenes: normal                 Vagina: normal appearing vagina with normal color and discharge, no lesions              Cervix: {CHL AMB PHY EX CERVIX NORM [phone]}              Bimanual Exam:  Uterus:  {CHL AMB PHY EX UTERUS NORM DEFAULT:[phone]}              Adnexa: {CHL AMB PHY EX ADNEXA NO MASS DEFAULT:[id number]}              Rectovaginal: {yes no:314532}.  Confirms.              Anus:  normal sphincter tone, no lesions  Chaperone was present for exam.  ASSESSMENT     PLAN    An After Visit Summary was printed and given to the patient.  *** minutes face to face time of which over 50% was  spent in counseling.

## 2021-05-19 ENCOUNTER — Other Ambulatory Visit: Payer: Self-pay

## 2021-05-19 DIAGNOSIS — Z113 Encounter for screening for infections with a predominantly sexual mode of transmission: Secondary | ICD-10-CM

## 2021-05-19 DIAGNOSIS — Z79899 Other long term (current) drug therapy: Secondary | ICD-10-CM

## 2021-05-19 DIAGNOSIS — B20 Human immunodeficiency virus [HIV] disease: Secondary | ICD-10-CM

## 2021-05-19 DIAGNOSIS — Z23 Encounter for immunization: Secondary | ICD-10-CM

## 2021-05-25 ENCOUNTER — Other Ambulatory Visit: Payer: BC Managed Care – PPO

## 2021-05-25 ENCOUNTER — Other Ambulatory Visit: Payer: Self-pay

## 2021-05-25 ENCOUNTER — Other Ambulatory Visit (HOSPITAL_COMMUNITY)
Admission: RE | Admit: 2021-05-25 | Discharge: 2021-05-25 | Disposition: A | Discharge status: Home / Self Care | Payer: BC Managed Care – PPO | Source: Ambulatory Visit | Attending: Internal Medicine | Admitting: Internal Medicine

## 2021-05-25 DIAGNOSIS — B20 Human immunodeficiency virus [HIV] disease: Secondary | ICD-10-CM

## 2021-05-25 DIAGNOSIS — Z79899 Other long term (current) drug therapy: Secondary | ICD-10-CM

## 2021-05-25 DIAGNOSIS — Z113 Encounter for screening for infections with a predominantly sexual mode of transmission: Secondary | ICD-10-CM | POA: Diagnosis not present

## 2021-05-26 LAB — T-HELPER CELL (CD4) - (RCID CLINIC ONLY)
CD4 % Helper T Cell: 28 % — ABNORMAL LOW (ref 33–65)
CD4 T Cell Abs: 429 /uL (ref 400–1790)

## 2021-05-27 LAB — COMPREHENSIVE METABOLIC PANEL
AG Ratio: 1.2 (calc) (ref 1.0–2.5)
ALT: 14 U/L (ref 6–29)
AST: 17 U/L (ref 10–30)
Albumin: 4.2 g/dL (ref 3.6–5.1)
Alkaline phosphatase (APISO): 56 U/L (ref 31–125)
BUN/Creatinine Ratio: 12 (calc) (ref 6–22)
BUN: 14 mg/dL (ref 7–25)
CO2: 25 mmol/L (ref 20–32)
Calcium: 9.5 mg/dL (ref 8.6–10.2)
Chloride: 105 mmol/L (ref 98–110)
Creat: 1.15 mg/dL — ABNORMAL HIGH (ref 0.50–0.99)
Globulin: 3.6 g/dL (calc) (ref 1.9–3.7)
Glucose, Bld: 87 mg/dL (ref 65–99)
Potassium: 3.7 mmol/L (ref 3.5–5.3)
Sodium: 137 mmol/L (ref 135–146)
Total Bilirubin: 0.3 mg/dL (ref 0.2–1.2)
Total Protein: 7.8 g/dL (ref 6.1–8.1)

## 2021-05-27 LAB — LIPID PANEL
Cholesterol: 219 mg/dL — ABNORMAL HIGH (ref <200)
HDL: 57 mg/dL (ref >=50)
LDL Cholesterol (Calc): 142 mg/dL (calc) — ABNORMAL HIGH
Non-HDL Cholesterol (Calc): 162 mg/dL (calc) — ABNORMAL HIGH (ref <130)
Total CHOL/HDL Ratio: 3.8 (calc) (ref <5.0)
Triglycerides: 91 mg/dL (ref <150)

## 2021-05-27 LAB — CBC WITH DIFFERENTIAL/PLATELET
Absolute Monocytes: 637 cells/uL (ref 200–950)
Basophils Absolute: 39 cells/uL (ref 0–200)
Basophils Relative: 0.8 %
Eosinophils Absolute: 93 cells/uL (ref 15–500)
Eosinophils Relative: 1.9 %
HCT: 35.2 % (ref 35.0–45.0)
Hemoglobin: 12.1 g/dL (ref 11.7–15.5)
Lymphs Abs: 1632 cells/uL (ref 850–3900)
MCH: 29.9 pg (ref 27.0–33.0)
MCHC: 34.4 g/dL (ref 32.0–36.0)
MCV: 86.9 fL (ref 80.0–100.0)
MPV: 10.1 fL (ref 7.5–12.5)
Monocytes Relative: 13 %
Neutro Abs: 2499 cells/uL (ref 1500–7800)
Neutrophils Relative %: 51 %
Platelets: 224 10*3/uL (ref 140–400)
RBC: 4.05 10*6/uL (ref 3.80–5.10)
RDW: 13.2 % (ref 11.0–15.0)
Total Lymphocyte: 33.3 %
WBC: 4.9 10*3/uL (ref 3.8–10.8)

## 2021-05-27 LAB — HIV-1 RNA QUANT-NO REFLEX-BLD
HIV 1 RNA Quant: NOT DETECTED Copies/mL
HIV-1 RNA Quant, Log: NOT DETECTED Log cps/mL

## 2021-05-27 LAB — RPR: RPR Ser Ql: NONREACTIVE

## 2021-05-27 LAB — URINE CYTOLOGY ANCILLARY ONLY
Chlamydia: NEGATIVE
Comment: NEGATIVE
Comment: NORMAL
Neisseria Gonorrhea: NEGATIVE

## 2021-06-01 ENCOUNTER — Other Ambulatory Visit (HOSPITAL_COMMUNITY): Payer: Self-pay

## 2021-06-07 ENCOUNTER — Other Ambulatory Visit (HOSPITAL_COMMUNITY): Payer: Self-pay

## 2021-06-08 ENCOUNTER — Other Ambulatory Visit: Payer: Self-pay

## 2021-06-08 ENCOUNTER — Encounter: Payer: Self-pay | Admitting: Internal Medicine

## 2021-06-08 ENCOUNTER — Ambulatory Visit (INDEPENDENT_AMBULATORY_CARE_PROVIDER_SITE_OTHER): Payer: BC Managed Care – PPO | Admitting: Internal Medicine

## 2021-06-08 VITALS — BP 114/72 | HR 73 | Temp 98.0°F | Wt 174.0 lb

## 2021-06-08 DIAGNOSIS — Z113 Encounter for screening for infections with a predominantly sexual mode of transmission: Secondary | ICD-10-CM

## 2021-06-08 DIAGNOSIS — B20 Human immunodeficiency virus [HIV] disease: Secondary | ICD-10-CM

## 2021-06-08 DIAGNOSIS — N289 Disorder of kidney and ureter, unspecified: Secondary | ICD-10-CM

## 2021-06-09 ENCOUNTER — Encounter: Payer: Self-pay | Admitting: Internal Medicine

## 2021-06-09 NOTE — Progress Notes (Signed)
   Subjective:    Patient ID: Allison Wilson, female    DOB: June 11, 1981, 40 y.o.   MRN: [id number]  HPI Here for follow up of HIV She continues on Tivicay and Descovy with no missed doses.  CD4 of 429 and viral load not detected. Creat mildly elevated similar to previous levels.  No concerns today.     Review of Systems  Constitutional:  Negative for unexpected weight change.  Gastrointestinal:  Negative for diarrhea and nausea.  Skin:  Negative for rash.      Objective:   Physical Exam Eyes:     General: No scleral icterus. Cardiovascular:     Rate and Rhythm: Normal rate and regular rhythm.  Pulmonary:     Effort: Pulmonary effort is normal.  Neurological:     Mental Status: She is alert.  Psychiatric:        Mood and Affect: Mood normal.   SH: no tobacco       Assessment & Plan:

## 2021-06-09 NOTE — Assessment & Plan Note (Signed)
Creat still mildly elevated but no new concerns.  Will continue to monitor yearly.   Advised to get a PCP as well.

## 2021-06-09 NOTE — Assessment & Plan Note (Signed)
Screened negative 

## 2021-06-09 NOTE — Assessment & Plan Note (Signed)
She continues to do well, no issues and no changes indicated.  She will follow up in 6 months.

## 2021-06-29 ENCOUNTER — Other Ambulatory Visit (HOSPITAL_COMMUNITY): Payer: Self-pay

## 2021-07-07 ENCOUNTER — Other Ambulatory Visit (HOSPITAL_COMMUNITY): Payer: Self-pay

## 2021-07-14 DIAGNOSIS — I10 Essential (primary) hypertension: Secondary | ICD-10-CM | POA: Diagnosis not present

## 2021-07-16 ENCOUNTER — Ambulatory Visit (INDEPENDENT_AMBULATORY_CARE_PROVIDER_SITE_OTHER): Payer: BC Managed Care – PPO | Admitting: Gynecology

## 2021-07-16 ENCOUNTER — Other Ambulatory Visit: Payer: Self-pay

## 2021-07-16 DIAGNOSIS — Z3042 Encounter for surveillance of injectable contraceptive: Secondary | ICD-10-CM

## 2021-07-16 MED ORDER — MEDROXYPROGESTERONE ACETATE 150 MG/ML IM SUSP
150.0000 mg | Freq: Once | INTRAMUSCULAR | Status: AC
  Administered 2021-07-16: 150 mg via INTRAMUSCULAR

## 2021-07-20 DIAGNOSIS — Z0000 Encounter for general adult medical examination without abnormal findings: Secondary | ICD-10-CM | POA: Diagnosis not present

## 2021-07-21 ENCOUNTER — Other Ambulatory Visit: Payer: Self-pay

## 2021-07-21 DIAGNOSIS — R87612 Low grade squamous intraepithelial lesion on cytologic smear of cervix (LGSIL): Secondary | ICD-10-CM

## 2021-07-21 DIAGNOSIS — R87618 Other abnormal cytological findings on specimens from cervix uteri: Secondary | ICD-10-CM

## 2021-07-29 ENCOUNTER — Other Ambulatory Visit: Payer: Self-pay | Admitting: Internal Medicine

## 2021-07-29 ENCOUNTER — Other Ambulatory Visit (HOSPITAL_COMMUNITY): Payer: Self-pay

## 2021-07-29 DIAGNOSIS — B20 Human immunodeficiency virus [HIV] disease: Secondary | ICD-10-CM

## 2021-07-29 MED ORDER — TIVICAY 50 MG PO TABS
ORAL_TABLET | Freq: Every day | ORAL | 4 refills | Status: DC
  Filled 2021-07-29: qty 30, 30d supply, fill #0
  Filled 2021-09-03: qty 30, 30d supply, fill #1
  Filled 2021-09-29: qty 30, 30d supply, fill #2
  Filled 2021-10-20: qty 30, 30d supply, fill #3
  Filled 2021-11-17: qty 30, 30d supply, fill #4

## 2021-07-29 MED ORDER — DESCOVY 200-25 MG PO TABS
1.0000 | ORAL_TABLET | Freq: Every day | ORAL | 4 refills | Status: DC
  Filled 2021-07-29: qty 30, 30d supply, fill #0
  Filled 2021-09-03: qty 30, 30d supply, fill #1
  Filled 2021-09-29: qty 30, 30d supply, fill #2
  Filled 2021-10-20: qty 30, 30d supply, fill #3
  Filled 2021-11-17: qty 30, 30d supply, fill #4

## 2021-08-02 ENCOUNTER — Other Ambulatory Visit (HOSPITAL_COMMUNITY): Payer: Self-pay

## 2021-08-25 ENCOUNTER — Other Ambulatory Visit (HOSPITAL_COMMUNITY): Payer: Self-pay

## 2021-08-27 ENCOUNTER — Other Ambulatory Visit (HOSPITAL_COMMUNITY): Payer: Self-pay

## 2021-08-30 ENCOUNTER — Other Ambulatory Visit (HOSPITAL_COMMUNITY): Payer: Self-pay

## 2021-09-03 ENCOUNTER — Other Ambulatory Visit (HOSPITAL_COMMUNITY): Payer: Self-pay

## 2021-09-29 ENCOUNTER — Ambulatory Visit: Payer: BC Managed Care – PPO | Admitting: Obstetrics and Gynecology

## 2021-09-29 ENCOUNTER — Telehealth: Payer: Self-pay

## 2021-09-29 ENCOUNTER — Other Ambulatory Visit (HOSPITAL_COMMUNITY): Payer: Self-pay

## 2021-09-29 DIAGNOSIS — Z0289 Encounter for other administrative examinations: Secondary | ICD-10-CM

## 2021-09-29 NOTE — Telephone Encounter (Signed)
Patient scheduled and no-showed her colposcopy appointment three times with Dr. Oscar La on 04/12/2021, 05/10/2021 and today (09/29/2021).  Dr. Oscar La, please advise on how you would like to proceed.  Cc: Carmelina Dane, Jerilynn Mages

## 2021-10-04 NOTE — Telephone Encounter (Signed)
Please send discharge letter. She should be strongly encouraged to seek care for her abnormal pap.

## 2021-10-04 NOTE — Telephone Encounter (Signed)
Routing to [employer]

## 2021-10-07 NOTE — Telephone Encounter (Signed)
Routing to [place] to update chart.   Encounter can then be closed.

## 2021-10-07 NOTE — Telephone Encounter (Signed)
I have completed the d/c letter to be amiled to her address on file & placed in outgoing mail.

## 2021-10-20 ENCOUNTER — Other Ambulatory Visit (HOSPITAL_COMMUNITY): Payer: Self-pay

## 2021-10-25 ENCOUNTER — Other Ambulatory Visit (HOSPITAL_COMMUNITY): Payer: Self-pay

## 2021-11-17 ENCOUNTER — Other Ambulatory Visit (HOSPITAL_COMMUNITY): Payer: Self-pay

## 2021-11-22 ENCOUNTER — Other Ambulatory Visit (HOSPITAL_COMMUNITY): Payer: Self-pay

## 2021-11-25 ENCOUNTER — Other Ambulatory Visit: Payer: Self-pay

## 2021-11-25 ENCOUNTER — Other Ambulatory Visit: Payer: BC Managed Care – PPO

## 2021-11-25 ENCOUNTER — Other Ambulatory Visit: Payer: Self-pay | Admitting: Internal Medicine

## 2021-11-25 DIAGNOSIS — B20 Human immunodeficiency virus [HIV] disease: Secondary | ICD-10-CM

## 2021-11-29 LAB — COMPLETE METABOLIC PANEL WITH GFR
AG Ratio: 1.1 (calc) (ref 1.0–2.5)
ALT: 11 U/L (ref 6–29)
AST: 16 U/L (ref 10–30)
Albumin: 4.2 g/dL (ref 3.6–5.1)
Alkaline phosphatase (APISO): 62 U/L (ref 31–125)
BUN/Creatinine Ratio: 11 (calc) (ref 6–22)
BUN: 12 mg/dL (ref 7–25)
CO2: 24 mmol/L (ref 20–32)
Calcium: 9.4 mg/dL (ref 8.6–10.2)
Chloride: 103 mmol/L (ref 98–110)
Creat: 1.08 mg/dL — ABNORMAL HIGH (ref 0.50–0.99)
Globulin: 3.8 g/dL (calc) — ABNORMAL HIGH (ref 1.9–3.7)
Glucose, Bld: 80 mg/dL (ref 65–99)
Potassium: 3.6 mmol/L (ref 3.5–5.3)
Sodium: 136 mmol/L (ref 135–146)
Total Bilirubin: 0.3 mg/dL (ref 0.2–1.2)
Total Protein: 8 g/dL (ref 6.1–8.1)
eGFR: 66 mL/min/{1.73_m2} (ref >=60)

## 2021-11-29 LAB — T-HELPER CELLS (CD4) COUNT (NOT AT ARMC)
Absolute CD4: 509 cells/uL (ref 490–1740)
CD4 T Helper %: 29 % — ABNORMAL LOW (ref 30–61)
Total lymphocyte count: 1748 cells/uL (ref 850–3900)

## 2021-11-29 LAB — HIV-1 RNA QUANT-NO REFLEX-BLD
HIV 1 RNA Quant: NOT DETECTED Copies/mL
HIV-1 RNA Quant, Log: NOT DETECTED Log cps/mL

## 2021-12-07 ENCOUNTER — Telehealth: Payer: Self-pay

## 2021-12-07 NOTE — Telephone Encounter (Signed)
Patient called office to reschedule follow up appt. States that she tested positive at work on 2/13. Is currently experiencing headache and sore throat. Is taking over the counter cold medication.  Refused virtual appt with provider. Prefers to come in person.  Will update provider on pos test.  Leatrice Jewels, RMA

## 2021-12-09 ENCOUNTER — Encounter: Payer: BC Managed Care – PPO | Admitting: Internal Medicine

## 2021-12-13 ENCOUNTER — Other Ambulatory Visit: Payer: Self-pay | Admitting: Internal Medicine

## 2021-12-13 ENCOUNTER — Other Ambulatory Visit (HOSPITAL_COMMUNITY): Payer: Self-pay

## 2021-12-13 DIAGNOSIS — B20 Human immunodeficiency virus [HIV] disease: Secondary | ICD-10-CM

## 2021-12-13 MED ORDER — DESCOVY 200-25 MG PO TABS
1.0000 | ORAL_TABLET | Freq: Every day | ORAL | 0 refills | Status: DC
  Filled 2021-12-13: qty 30, 30d supply, fill #0

## 2021-12-13 MED ORDER — TIVICAY 50 MG PO TABS
ORAL_TABLET | Freq: Every day | ORAL | 0 refills | Status: DC
  Filled 2021-12-13: qty 30, 30d supply, fill #0

## 2021-12-14 ENCOUNTER — Other Ambulatory Visit (HOSPITAL_COMMUNITY): Payer: Self-pay

## 2021-12-20 ENCOUNTER — Other Ambulatory Visit (HOSPITAL_COMMUNITY): Payer: Self-pay

## 2021-12-31 ENCOUNTER — Ambulatory Visit: Payer: BC Managed Care – PPO | Admitting: Internal Medicine

## 2021-12-31 ENCOUNTER — Other Ambulatory Visit: Payer: Self-pay

## 2021-12-31 ENCOUNTER — Encounter: Payer: Self-pay | Admitting: Internal Medicine

## 2021-12-31 ENCOUNTER — Encounter: Payer: BC Managed Care – PPO | Admitting: Internal Medicine

## 2021-12-31 VITALS — BP 138/86 | HR 80 | Temp 98.1°F | Ht 64.0 in | Wt 179.0 lb

## 2021-12-31 DIAGNOSIS — Z79899 Other long term (current) drug therapy: Secondary | ICD-10-CM

## 2021-12-31 DIAGNOSIS — Z23 Encounter for immunization: Secondary | ICD-10-CM | POA: Diagnosis not present

## 2021-12-31 DIAGNOSIS — N289 Disorder of kidney and ureter, unspecified: Secondary | ICD-10-CM

## 2021-12-31 DIAGNOSIS — Z113 Encounter for screening for infections with a predominantly sexual mode of transmission: Secondary | ICD-10-CM

## 2021-12-31 DIAGNOSIS — R87612 Low grade squamous intraepithelial lesion on cytologic smear of cervix (LGSIL): Secondary | ICD-10-CM

## 2021-12-31 DIAGNOSIS — B20 Human immunodeficiency virus [HIV] disease: Secondary | ICD-10-CM

## 2021-12-31 NOTE — Addendum Note (Signed)
Addended by: Primus Bravo E on: 12/31/2021 02:56 PM ? ? Modules accepted: Orders ? ?

## 2021-12-31 NOTE — Assessment & Plan Note (Signed)
She needs gynecology follow up and will have her referred back somewhere.  ?

## 2021-12-31 NOTE — Assessment & Plan Note (Signed)
Creat stable and will continue to monitor 

## 2021-12-31 NOTE — Progress Notes (Signed)
? ?  Subjective:  ? ? Patient ID: Allison Wilson, female    DOB: September 25, 1981, 41 y.o.   MRN: [id number] ? ?HPI ?Here for follow up of HIV ?She continues on Tivicay and Descovy and denies any missed doses.  No issues with getting, taking or tolerating the medication.  ?No complaints today.  Had symptomatic COVID-19 recently.   ? ? ?Review of Systems  ?Constitutional:  Negative for fatigue.  ?Gastrointestinal:  Negative for diarrhea and nausea.  ?Skin:  Negative for rash.  ? ?   ?Objective:  ? Physical Exam ?Eyes:  ?   General: No scleral icterus. ?Skin: ?   Findings: No rash.  ?Neurological:  ?   General: No focal deficit present.  ?   Mental Status: She is alert.  ?Psychiatric:     ?   Mood and Affect: Mood normal.  ? ?SH: no tobacco ? ? ? ?   ?Assessment & Plan:  ? ? ?

## 2021-12-31 NOTE — Assessment & Plan Note (Signed)
Discussed Prevnar-20 and given today °

## 2022-01-06 ENCOUNTER — Other Ambulatory Visit (HOSPITAL_COMMUNITY): Payer: Self-pay

## 2022-01-06 ENCOUNTER — Other Ambulatory Visit: Payer: Self-pay | Admitting: Internal Medicine

## 2022-01-06 MED ORDER — TIVICAY 50 MG PO TABS
ORAL_TABLET | Freq: Every day | ORAL | 5 refills | Status: DC
  Filled 2022-01-17: qty 30, 30d supply, fill #0
  Filled 2022-02-08: qty 30, 30d supply, fill #1
  Filled 2022-05-04: qty 30, 30d supply, fill #2
  Filled 2022-05-30: qty 30, 30d supply, fill #3
  Filled 2022-06-23: qty 30, 30d supply, fill #4
  Filled 2022-07-19: qty 30, 30d supply, fill #5

## 2022-01-06 MED ORDER — DESCOVY 200-25 MG PO TABS
1.0000 | ORAL_TABLET | Freq: Every day | ORAL | 5 refills | Status: DC
  Filled 2022-01-17: qty 30, 30d supply, fill #0
  Filled 2022-02-08: qty 30, 30d supply, fill #1
  Filled 2022-05-04: qty 30, 30d supply, fill #2
  Filled 2022-05-30: qty 30, 30d supply, fill #3
  Filled 2022-06-23: qty 30, 30d supply, fill #4
  Filled 2022-07-19: qty 30, 30d supply, fill #5

## 2022-01-10 ENCOUNTER — Other Ambulatory Visit (HOSPITAL_COMMUNITY): Payer: Self-pay

## 2022-01-17 ENCOUNTER — Other Ambulatory Visit (HOSPITAL_COMMUNITY): Payer: Self-pay

## 2022-01-28 ENCOUNTER — Other Ambulatory Visit: Payer: Self-pay | Admitting: Obstetrics and Gynecology

## 2022-01-28 DIAGNOSIS — Z1231 Encounter for screening mammogram for malignant neoplasm of breast: Secondary | ICD-10-CM

## 2022-02-08 ENCOUNTER — Other Ambulatory Visit (HOSPITAL_COMMUNITY): Payer: Self-pay

## 2022-02-11 IMAGING — MG MM DIGITAL SCREENING BILAT W/ TOMO AND CAD
8 series · 8 of 24 positions shown · non-contrast
Comparison: None.

CLINICAL DATA: Screening.

EXAM:
DIGITAL SCREENING BILATERAL MAMMOGRAM WITH TOMOSYNTHESIS AND CAD
TECHNIQUE: Bilateral screening digital craniocaudal and mediolateral oblique
mammograms were obtained. Bilateral screening digital breast
tomosynthesis was performed. The images were evaluated with
computer-aided detection.

[R CC synth-2D]
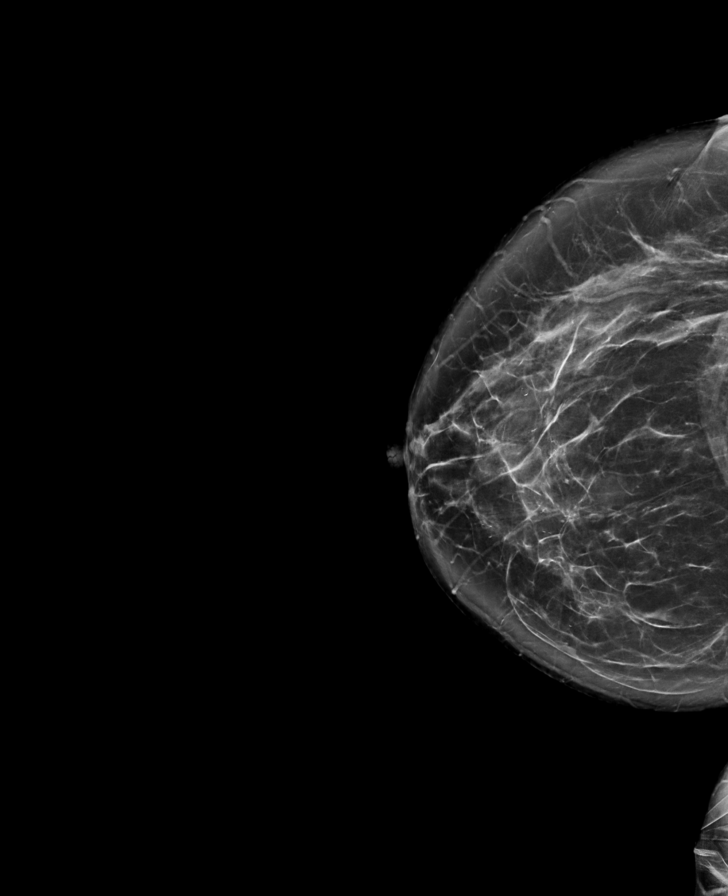

[R MLO synth-2D]
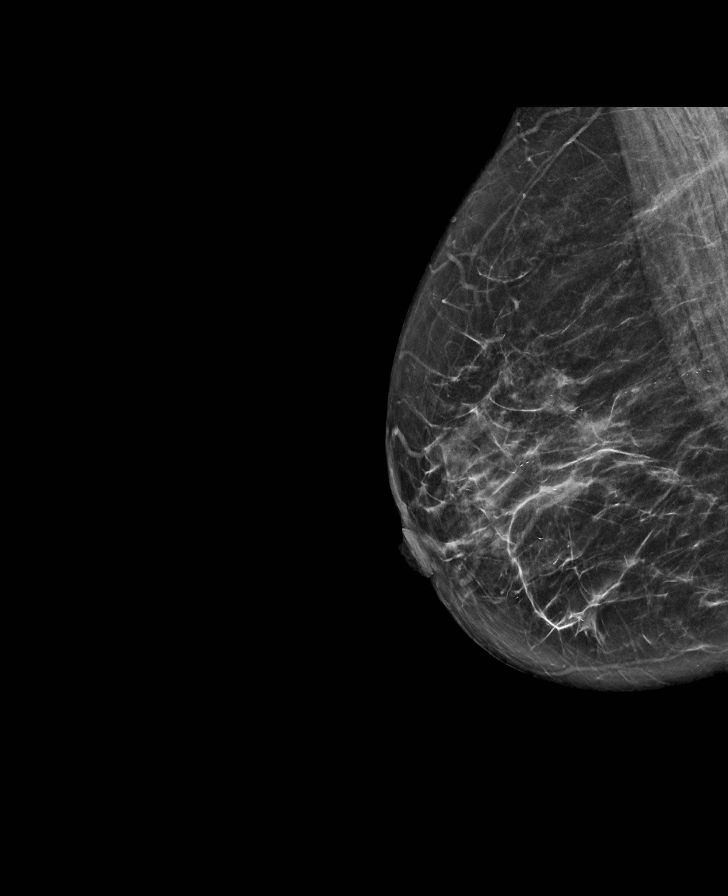

[L MLO synth-2D]
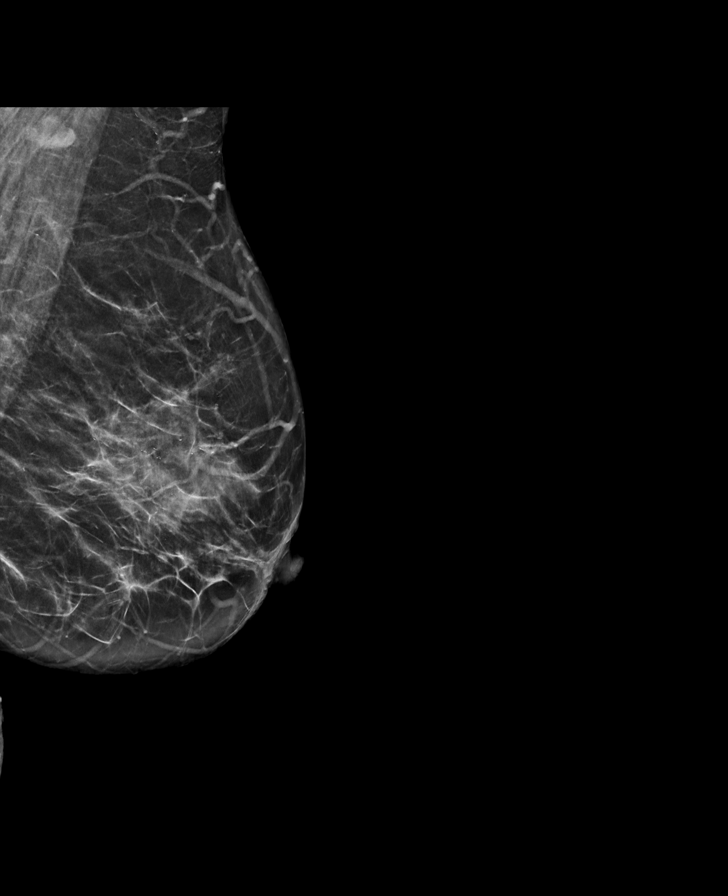

[L CC synth-2D]
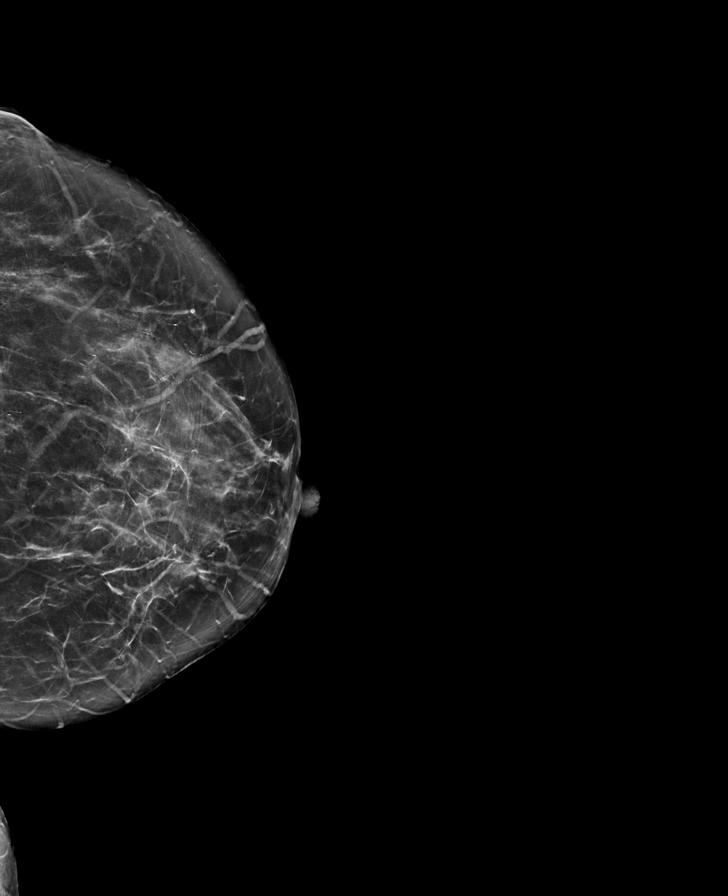

[L MLO tomo · tomo slice 33/66.0]
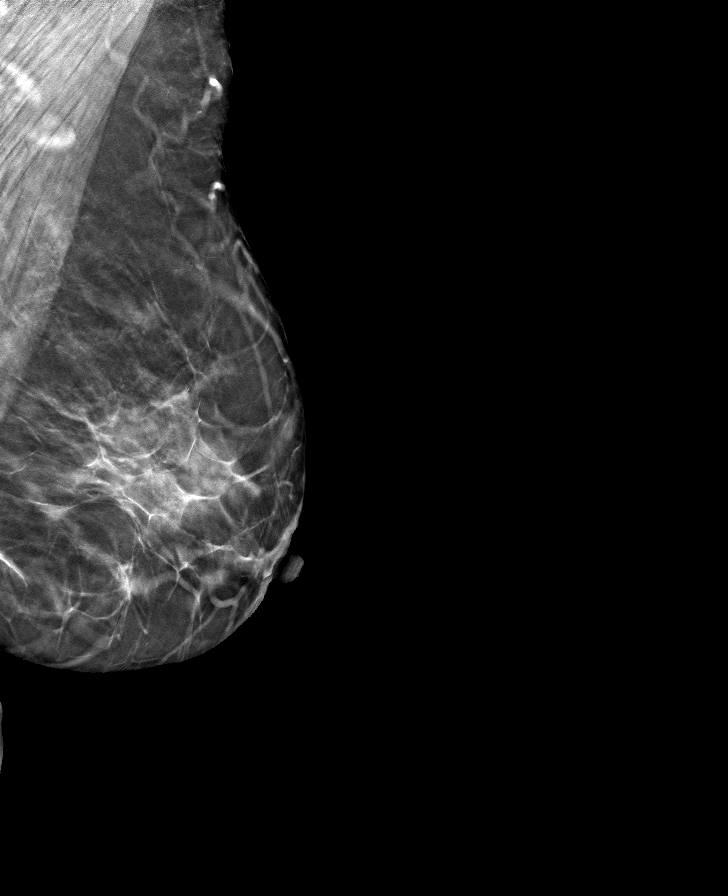

[L CC tomo · tomo slice 31/61.0]
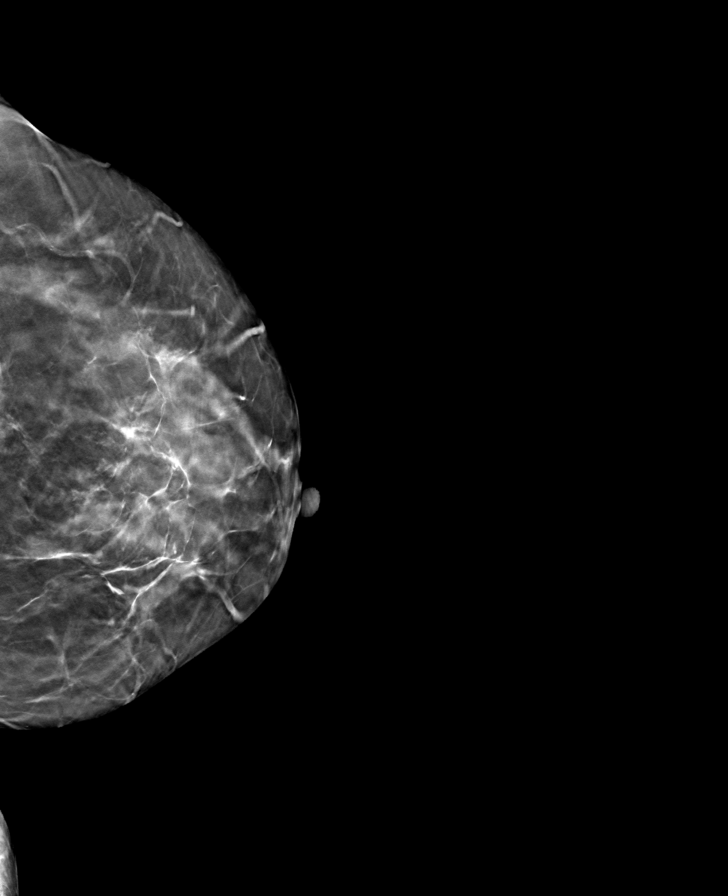

[R MLO tomo · tomo slice 33/65.0]
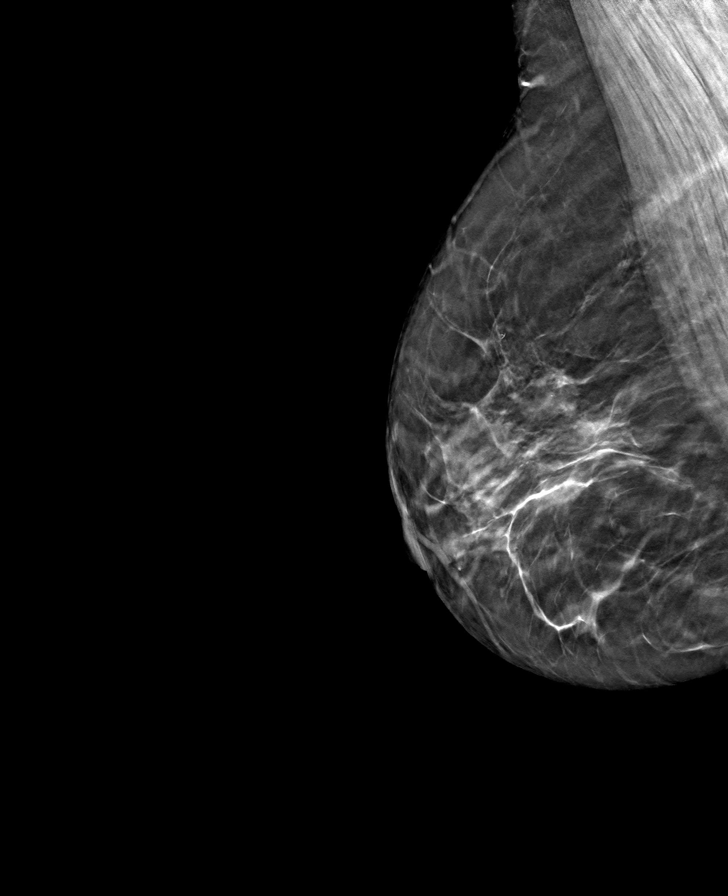

[R CC tomo · tomo slice 39/77.0]
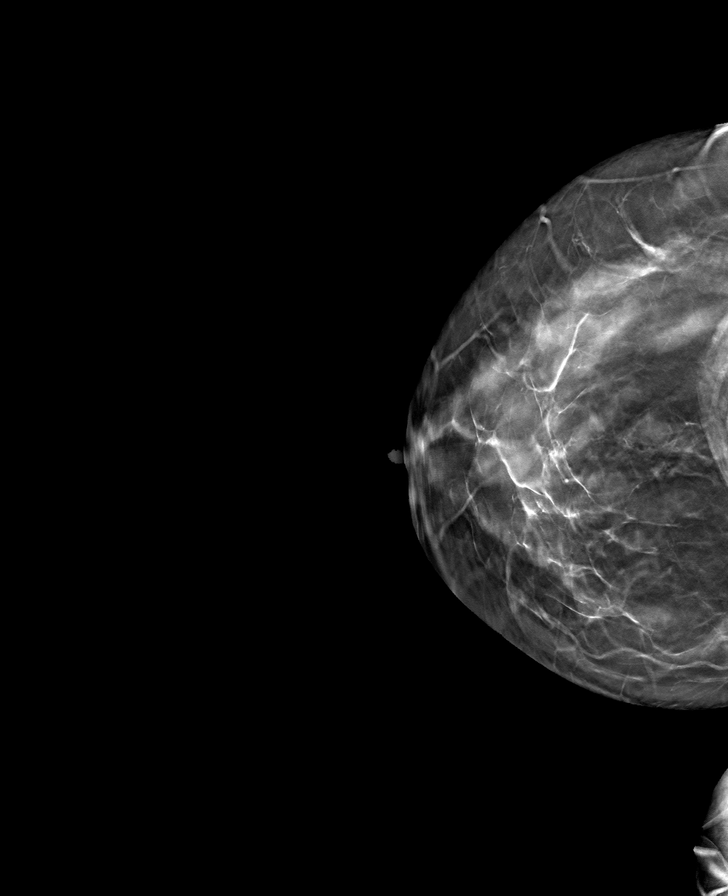

[8 of 24 positions shown; findings below may reference images not displayed]

ACR Breast Density Category c: The breast tissue is heterogeneously
dense, which may obscure small masses
FINDINGS: There are no findings suspicious for malignancy. Bilateral vascular
calcifications. The images were evaluated with computer-aided
detection.
IMPRESSION: No mammographic evidence of malignancy. A result letter of this
screening mammogram will be mailed directly to the patient.

RECOMMENDATION:
Screening mammogram in one year. (Code:[id number])

BI-RADS CATEGORY  2: Benign.

## 2022-02-15 ENCOUNTER — Other Ambulatory Visit (HOSPITAL_COMMUNITY): Payer: Self-pay

## 2022-03-02 ENCOUNTER — Ambulatory Visit
Admission: RE | Admit: 2022-03-02 | Discharge: 2022-03-02 | Disposition: A | Discharge status: Home / Self Care | Payer: BC Managed Care – PPO | Source: Ambulatory Visit | Attending: Obstetrics and Gynecology | Admitting: Obstetrics and Gynecology

## 2022-03-02 DIAGNOSIS — Z1231 Encounter for screening mammogram for malignant neoplasm of breast: Secondary | ICD-10-CM

## 2022-03-09 ENCOUNTER — Other Ambulatory Visit (HOSPITAL_COMMUNITY): Payer: Self-pay

## 2022-05-04 ENCOUNTER — Other Ambulatory Visit (HOSPITAL_COMMUNITY): Payer: Self-pay

## 2022-05-05 ENCOUNTER — Other Ambulatory Visit (HOSPITAL_COMMUNITY): Payer: Self-pay

## 2022-05-26 ENCOUNTER — Other Ambulatory Visit (HOSPITAL_COMMUNITY): Payer: Self-pay

## 2022-05-30 ENCOUNTER — Other Ambulatory Visit (HOSPITAL_COMMUNITY): Payer: Self-pay

## 2022-06-22 ENCOUNTER — Other Ambulatory Visit (HOSPITAL_COMMUNITY): Payer: Self-pay

## 2022-06-23 ENCOUNTER — Other Ambulatory Visit (HOSPITAL_COMMUNITY): Payer: Self-pay

## 2022-06-28 ENCOUNTER — Other Ambulatory Visit (HOSPITAL_COMMUNITY): Payer: Self-pay

## 2022-07-06 ENCOUNTER — Other Ambulatory Visit: Payer: Self-pay

## 2022-07-06 ENCOUNTER — Other Ambulatory Visit: Payer: BC Managed Care – PPO

## 2022-07-06 DIAGNOSIS — Z79899 Other long term (current) drug therapy: Secondary | ICD-10-CM

## 2022-07-06 DIAGNOSIS — Z113 Encounter for screening for infections with a predominantly sexual mode of transmission: Secondary | ICD-10-CM

## 2022-07-06 DIAGNOSIS — B20 Human immunodeficiency virus [HIV] disease: Secondary | ICD-10-CM

## 2022-07-07 LAB — T-HELPER CELL (CD4) - (RCID CLINIC ONLY)
CD4 % Helper T Cell: 29 % — ABNORMAL LOW (ref 33–65)
CD4 T Cell Abs: 417 /uL (ref 400–1790)

## 2022-07-09 LAB — COMPLETE METABOLIC PANEL WITH GFR
AG Ratio: 1.1 (calc) (ref 1.0–2.5)
ALT: 13 U/L (ref 6–29)
AST: 16 U/L (ref 10–30)
Albumin: 4 g/dL (ref 3.6–5.1)
Alkaline phosphatase (APISO): 65 U/L (ref 31–125)
BUN/Creatinine Ratio: 14 (calc) (ref 6–22)
BUN: 15 mg/dL (ref 7–25)
CO2: 25 mmol/L (ref 20–32)
Calcium: 9.2 mg/dL (ref 8.6–10.2)
Chloride: 102 mmol/L (ref 98–110)
Creat: 1.05 mg/dL — ABNORMAL HIGH (ref 0.50–0.99)
Globulin: 3.5 g/dL (calc) (ref 1.9–3.7)
Glucose, Bld: 90 mg/dL (ref 65–99)
Potassium: 3.6 mmol/L (ref 3.5–5.3)
Sodium: 136 mmol/L (ref 135–146)
Total Bilirubin: 0.3 mg/dL (ref 0.2–1.2)
Total Protein: 7.5 g/dL (ref 6.1–8.1)
eGFR: 68 mL/min/{1.73_m2} (ref >=60)

## 2022-07-09 LAB — CBC WITH DIFFERENTIAL/PLATELET
Absolute Monocytes: 702 cells/uL (ref 200–950)
Basophils Absolute: 41 cells/uL (ref 0–200)
Basophils Relative: 0.9 %
Eosinophils Absolute: 140 cells/uL (ref 15–500)
Eosinophils Relative: 3.1 %
HCT: 35.9 % (ref 35.0–45.0)
Hemoglobin: 12.4 g/dL (ref 11.7–15.5)
Lymphs Abs: 1472 cells/uL (ref 850–3900)
MCH: 29.5 pg (ref 27.0–33.0)
MCHC: 34.5 g/dL (ref 32.0–36.0)
MCV: 85.3 fL (ref 80.0–100.0)
MPV: 10.1 fL (ref 7.5–12.5)
Monocytes Relative: 15.6 %
Neutro Abs: 2147 cells/uL (ref 1500–7800)
Neutrophils Relative %: 47.7 %
Platelets: 232 10*3/uL (ref 140–400)
RBC: 4.21 10*6/uL (ref 3.80–5.10)
RDW: 12.5 % (ref 11.0–15.0)
Total Lymphocyte: 32.7 %
WBC: 4.5 10*3/uL (ref 3.8–10.8)

## 2022-07-09 LAB — HIV-1 RNA QUANT-NO REFLEX-BLD
HIV 1 RNA Quant: NOT DETECTED Copies/mL
HIV-1 RNA Quant, Log: NOT DETECTED Log cps/mL

## 2022-07-09 LAB — LIPID PANEL
Cholesterol: 186 mg/dL (ref <200)
HDL: 67 mg/dL (ref >=50)
LDL Cholesterol (Calc): 103 mg/dL (calc) — ABNORMAL HIGH
Non-HDL Cholesterol (Calc): 119 mg/dL (calc) (ref <130)
Total CHOL/HDL Ratio: 2.8 (calc) (ref <5.0)
Triglycerides: 73 mg/dL (ref <150)

## 2022-07-09 LAB — RPR: RPR Ser Ql: NONREACTIVE

## 2022-07-19 ENCOUNTER — Other Ambulatory Visit (HOSPITAL_COMMUNITY): Payer: Self-pay

## 2022-07-20 ENCOUNTER — Other Ambulatory Visit (HOSPITAL_COMMUNITY): Payer: Self-pay

## 2022-07-20 ENCOUNTER — Ambulatory Visit (INDEPENDENT_AMBULATORY_CARE_PROVIDER_SITE_OTHER): Payer: BC Managed Care – PPO | Admitting: Internal Medicine

## 2022-07-20 ENCOUNTER — Encounter: Payer: Self-pay | Admitting: Internal Medicine

## 2022-07-20 ENCOUNTER — Other Ambulatory Visit: Payer: Self-pay

## 2022-07-20 DIAGNOSIS — Z79899 Other long term (current) drug therapy: Secondary | ICD-10-CM

## 2022-07-20 DIAGNOSIS — Z113 Encounter for screening for infections with a predominantly sexual mode of transmission: Secondary | ICD-10-CM

## 2022-07-20 DIAGNOSIS — B20 Human immunodeficiency virus [HIV] disease: Secondary | ICD-10-CM | POA: Diagnosis not present

## 2022-07-20 MED ORDER — TIVICAY 50 MG PO TABS
ORAL_TABLET | Freq: Every day | ORAL | 11 refills | Status: DC
  Filled 2022-07-20: qty 30, 30d supply, fill #0
  Filled 2022-08-11: qty 30, 30d supply, fill #1
  Filled 2022-09-12: qty 30, 30d supply, fill #2
  Filled 2022-10-31: qty 30, 30d supply, fill #3
  Filled 2022-12-07: qty 30, 30d supply, fill #4
  Filled 2022-12-27: qty 30, 30d supply, fill #5
  Filled 2023-01-26: qty 30, 30d supply, fill #6
  Filled 2023-02-21: qty 30, 30d supply, fill #7
  Filled 2023-03-22: qty 30, 30d supply, fill #8
  Filled 2023-04-21: qty 30, 30d supply, fill #9
  Filled 2023-05-17: qty 30, 30d supply, fill #10
  Filled 2023-06-19: qty 30, 30d supply, fill #11

## 2022-07-20 MED ORDER — DESCOVY 200-25 MG PO TABS
1.0000 | ORAL_TABLET | Freq: Every day | ORAL | 11 refills | Status: DC
  Filled 2022-07-20: qty 30, 30d supply, fill #0
  Filled 2022-08-11: qty 30, 30d supply, fill #1
  Filled 2022-09-12: qty 30, 30d supply, fill #2
  Filled 2022-10-31: qty 30, 30d supply, fill #3
  Filled 2022-12-07: qty 30, 30d supply, fill #4
  Filled 2022-12-27: qty 30, 30d supply, fill #5
  Filled 2023-01-26: qty 30, 30d supply, fill #6
  Filled 2023-02-21: qty 30, 30d supply, fill #7
  Filled 2023-03-22: qty 30, 30d supply, fill #8
  Filled 2023-04-21: qty 30, 30d supply, fill #9
  Filled 2023-05-17: qty 30, 30d supply, fill #10
  Filled 2023-06-19: qty 30, 30d supply, fill #11

## 2022-07-20 NOTE — Assessment & Plan Note (Signed)
Lipid panel reviewed with her and since she is now on a statin, her LDL has declined.

## 2022-07-20 NOTE — Progress Notes (Signed)
   Subjective:    Patient ID: Allison Wilson, female    DOB: 09-09-1981, 41 y.o.   MRN: [id number]  HPI Here for follow up of HIV She continues on Tivicay and Descovy and has had no issues with getting, taking or tolreating her medication.  Gets her flu shot from her work.  No complaints today.    Review of Systems  Constitutional:  Negative for fatigue.  Gastrointestinal:  Negative for diarrhea.  Skin:  Negative for rash.       Objective:   Physical Exam Eyes:     General: No scleral icterus. Pulmonary:     Effort: Pulmonary effort is normal.  Neurological:     General: No focal deficit present.     Mental Status: She is alert.   SH: no tobacco        Assessment & Plan:

## 2022-07-20 NOTE — Assessment & Plan Note (Signed)
Screened negative 

## 2022-07-20 NOTE — Assessment & Plan Note (Signed)
She continues to do well on her regimen and no changes indicated.   Follow up in 6 months.  

## 2022-07-25 ENCOUNTER — Other Ambulatory Visit (HOSPITAL_COMMUNITY): Payer: Self-pay

## 2022-08-11 ENCOUNTER — Other Ambulatory Visit (HOSPITAL_COMMUNITY): Payer: Self-pay

## 2022-08-18 ENCOUNTER — Other Ambulatory Visit (HOSPITAL_COMMUNITY): Payer: Self-pay

## 2022-08-22 ENCOUNTER — Other Ambulatory Visit (HOSPITAL_COMMUNITY): Payer: Self-pay

## 2022-09-06 ENCOUNTER — Other Ambulatory Visit (HOSPITAL_COMMUNITY): Payer: Self-pay

## 2022-09-09 ENCOUNTER — Other Ambulatory Visit (HOSPITAL_COMMUNITY): Payer: Self-pay

## 2022-09-12 ENCOUNTER — Other Ambulatory Visit (HOSPITAL_COMMUNITY): Payer: Self-pay

## 2022-09-19 ENCOUNTER — Other Ambulatory Visit (HOSPITAL_COMMUNITY): Payer: Self-pay

## 2022-10-10 ENCOUNTER — Other Ambulatory Visit (HOSPITAL_COMMUNITY): Payer: Self-pay

## 2022-10-31 ENCOUNTER — Other Ambulatory Visit (HOSPITAL_COMMUNITY): Payer: Self-pay

## 2022-10-31 ENCOUNTER — Other Ambulatory Visit: Payer: Self-pay

## 2022-11-22 ENCOUNTER — Other Ambulatory Visit (HOSPITAL_COMMUNITY): Payer: Self-pay

## 2022-11-24 ENCOUNTER — Other Ambulatory Visit (HOSPITAL_COMMUNITY): Payer: Self-pay

## 2022-12-07 ENCOUNTER — Other Ambulatory Visit (HOSPITAL_COMMUNITY): Payer: Self-pay

## 2022-12-27 ENCOUNTER — Other Ambulatory Visit (HOSPITAL_COMMUNITY): Payer: Self-pay

## 2023-01-02 ENCOUNTER — Other Ambulatory Visit: Payer: Self-pay

## 2023-01-18 ENCOUNTER — Other Ambulatory Visit: Payer: Self-pay

## 2023-01-18 ENCOUNTER — Ambulatory Visit: Payer: BC Managed Care – PPO | Admitting: Internal Medicine

## 2023-01-18 ENCOUNTER — Encounter: Payer: Self-pay | Admitting: Internal Medicine

## 2023-01-18 VITALS — BP 123/81 | HR 75 | Temp 97.7°F | Ht 64.0 in | Wt 183.0 lb

## 2023-01-18 DIAGNOSIS — N289 Disorder of kidney and ureter, unspecified: Secondary | ICD-10-CM | POA: Diagnosis not present

## 2023-01-18 DIAGNOSIS — Z79899 Other long term (current) drug therapy: Secondary | ICD-10-CM | POA: Diagnosis not present

## 2023-01-18 DIAGNOSIS — Z113 Encounter for screening for infections with a predominantly sexual mode of transmission: Secondary | ICD-10-CM

## 2023-01-18 DIAGNOSIS — B20 Human immunodeficiency virus [HIV] disease: Secondary | ICD-10-CM

## 2023-01-18 NOTE — Assessment & Plan Note (Signed)
Has been mild and no changes.  Will continue to monitor

## 2023-01-18 NOTE — Progress Notes (Signed)
   Subjective:    Patient ID: Allison Wilson, female    DOB: 1981/03/26, 42 y.o.   MRN: [id number]  HPI She is here for follow up of HIV She continues to be well-controlled on Tivicay and Descovy with no issues.  Recently went back home for 1 month.  No complaints today.    Review of Systems  Constitutional:  Negative for fatigue.  Gastrointestinal:  Negative for diarrhea.  Skin:  Negative for rash.       Objective:   Physical Exam Eyes:     General: No scleral icterus. Pulmonary:     Effort: Pulmonary effort is normal.  Skin:    Findings: No rash.  Neurological:     Mental Status: She is alert.   SH no tobacco        Assessment & Plan:

## 2023-01-18 NOTE — Assessment & Plan Note (Signed)
She continues to do well, no concerns and she can rtc in 6 months.

## 2023-01-19 LAB — T-HELPER CELL (CD4) - (RCID CLINIC ONLY)
CD4 % Helper T Cell: 32 % — ABNORMAL LOW (ref 33–65)
CD4 T Cell Abs: 492 /uL (ref 400–1790)

## 2023-01-21 LAB — HIV-1 RNA QUANT-NO REFLEX-BLD
HIV 1 RNA Quant: <20 Copies/mL — ABNORMAL HIGH
HIV-1 RNA Quant, Log: <1.3 Log cps/mL — ABNORMAL HIGH

## 2023-01-26 ENCOUNTER — Other Ambulatory Visit (HOSPITAL_COMMUNITY): Payer: Self-pay

## 2023-01-27 ENCOUNTER — Other Ambulatory Visit: Payer: Self-pay | Admitting: Internal Medicine

## 2023-01-27 DIAGNOSIS — Z1231 Encounter for screening mammogram for malignant neoplasm of breast: Secondary | ICD-10-CM

## 2023-01-31 ENCOUNTER — Other Ambulatory Visit (HOSPITAL_COMMUNITY): Payer: Self-pay

## 2023-02-21 ENCOUNTER — Other Ambulatory Visit (HOSPITAL_COMMUNITY): Payer: Self-pay

## 2023-02-27 ENCOUNTER — Other Ambulatory Visit: Payer: Self-pay

## 2023-03-07 ENCOUNTER — Ambulatory Visit
Admission: RE | Admit: 2023-03-07 | Discharge: 2023-03-07 | Disposition: A | Discharge status: Home / Self Care | Payer: BC Managed Care – PPO | Source: Ambulatory Visit | Attending: Internal Medicine | Admitting: Internal Medicine

## 2023-03-07 DIAGNOSIS — Z1231 Encounter for screening mammogram for malignant neoplasm of breast: Secondary | ICD-10-CM

## 2023-03-22 ENCOUNTER — Other Ambulatory Visit (HOSPITAL_COMMUNITY): Payer: Self-pay

## 2023-03-24 ENCOUNTER — Other Ambulatory Visit (HOSPITAL_COMMUNITY): Payer: Self-pay

## 2023-03-27 ENCOUNTER — Other Ambulatory Visit (HOSPITAL_COMMUNITY): Payer: Self-pay

## 2023-04-18 ENCOUNTER — Other Ambulatory Visit (HOSPITAL_COMMUNITY): Payer: Self-pay

## 2023-04-21 ENCOUNTER — Other Ambulatory Visit (HOSPITAL_COMMUNITY): Payer: Self-pay

## 2023-04-24 ENCOUNTER — Other Ambulatory Visit (HOSPITAL_COMMUNITY): Payer: Self-pay

## 2023-05-17 ENCOUNTER — Other Ambulatory Visit (HOSPITAL_COMMUNITY): Payer: Self-pay

## 2023-05-19 ENCOUNTER — Other Ambulatory Visit: Payer: Self-pay

## 2023-06-14 ENCOUNTER — Other Ambulatory Visit: Payer: Self-pay

## 2023-06-19 ENCOUNTER — Other Ambulatory Visit: Payer: Self-pay

## 2023-06-19 ENCOUNTER — Other Ambulatory Visit (HOSPITAL_COMMUNITY): Payer: Self-pay

## 2023-06-20 ENCOUNTER — Other Ambulatory Visit (HOSPITAL_COMMUNITY): Payer: Self-pay

## 2023-06-21 ENCOUNTER — Other Ambulatory Visit (HOSPITAL_COMMUNITY): Payer: Self-pay

## 2023-07-11 ENCOUNTER — Telehealth: Payer: Self-pay

## 2023-07-11 ENCOUNTER — Other Ambulatory Visit: Payer: Self-pay

## 2023-07-11 ENCOUNTER — Other Ambulatory Visit (HOSPITAL_COMMUNITY): Payer: Self-pay

## 2023-07-11 DIAGNOSIS — B20 Human immunodeficiency virus [HIV] disease: Secondary | ICD-10-CM

## 2023-07-11 MED ORDER — TIVICAY 50 MG PO TABS: ORAL_TABLET | Freq: Every day | ORAL | 0 refills | Status: DC

## 2023-07-11 MED ORDER — DESCOVY 200-25 MG PO TABS: 1.0000 | ORAL_TABLET | Freq: Every day | ORAL | 0 refills | Status: DC

## 2023-07-11 NOTE — Telephone Encounter (Signed)
Received call from patient's spouse requesting medication refill. No insurance noted. Staff advised spouse that patient will need to contact [facility] financial counselor. Patient will reach out after work. Staff will send updated Rx to [facility] once paperwork is finalized.   Valarie Cones, LPN

## 2023-07-12 ENCOUNTER — Other Ambulatory Visit (HOSPITAL_COMMUNITY): Payer: Self-pay

## 2023-07-18 ENCOUNTER — Other Ambulatory Visit (HOSPITAL_COMMUNITY): Payer: Self-pay

## 2023-07-18 ENCOUNTER — Other Ambulatory Visit: Payer: Self-pay

## 2023-07-25 ENCOUNTER — Ambulatory Visit: Payer: Self-pay | Admitting: Internal Medicine

## 2023-07-25 ENCOUNTER — Ambulatory Visit: Payer: BC Managed Care – PPO | Admitting: Internal Medicine

## 2023-07-31 ENCOUNTER — Other Ambulatory Visit: Payer: Self-pay

## 2023-07-31 DIAGNOSIS — B20 Human immunodeficiency virus [HIV] disease: Secondary | ICD-10-CM

## 2023-07-31 MED ORDER — TIVICAY 50 MG PO TABS: ORAL_TABLET | Freq: Every day | ORAL | 0 refills | Status: DC

## 2023-07-31 MED ORDER — DESCOVY 200-25 MG PO TABS: 1.0000 | ORAL_TABLET | Freq: Every day | ORAL | 0 refills | Status: DC

## 2023-08-02 ENCOUNTER — Telehealth: Payer: Self-pay

## 2023-08-02 ENCOUNTER — Other Ambulatory Visit (HOSPITAL_COMMUNITY): Payer: Self-pay

## 2023-08-02 NOTE — Telephone Encounter (Signed)
[facility] Patient Advocate Encounter   Received notification from [facility] that prior authorization for Tivicay is required.   PA submitted on 08/02/23 Key [id number] Status is pending    [facility] will continue to follow.   Clearance Coots, CPhT Specialty Pharmacy Patient [facility] for Infectious Disease Phone: [phone] Fax:  [phone]

## 2023-08-03 ENCOUNTER — Other Ambulatory Visit (HOSPITAL_COMMUNITY): Payer: Self-pay

## 2023-08-03 ENCOUNTER — Other Ambulatory Visit: Payer: Self-pay

## 2023-08-08 ENCOUNTER — Other Ambulatory Visit (HOSPITAL_COMMUNITY): Payer: Self-pay

## 2023-08-08 ENCOUNTER — Other Ambulatory Visit: Payer: Self-pay

## 2023-08-08 ENCOUNTER — Telehealth: Payer: Self-pay

## 2023-08-08 ENCOUNTER — Encounter: Payer: Self-pay | Admitting: Internal Medicine

## 2023-08-08 ENCOUNTER — Ambulatory Visit (INDEPENDENT_AMBULATORY_CARE_PROVIDER_SITE_OTHER): Payer: Managed Care, Other (non HMO) | Admitting: Internal Medicine

## 2023-08-08 VITALS — BP 126/84 | HR 74 | Temp 98.2°F | Ht 64.0 in | Wt 184.0 lb

## 2023-08-08 DIAGNOSIS — B20 Human immunodeficiency virus [HIV] disease: Secondary | ICD-10-CM

## 2023-08-08 DIAGNOSIS — Z113 Encounter for screening for infections with a predominantly sexual mode of transmission: Secondary | ICD-10-CM

## 2023-08-08 DIAGNOSIS — Z79899 Other long term (current) drug therapy: Secondary | ICD-10-CM

## 2023-08-08 MED ORDER — BICTEGRAVIR-EMTRICITAB-TENOFOV 50-200-25 MG PO TABS
1.0000 | ORAL_TABLET | Freq: Every day | ORAL | 11 refills | Status: DC
  Filled 2023-08-09: qty 30, 30d supply, fill #0
  Filled 2023-08-30: qty 30, 30d supply, fill #1
  Filled 2023-10-02: qty 30, 30d supply, fill #2
  Filled 2023-11-06 – 2023-11-24 (×2): qty 30, 30d supply, fill #3
  Filled 2023-12-18 (×3): qty 30, 30d supply, fill #4
  Filled 2024-01-15: qty 30, 30d supply, fill #5

## 2023-08-08 NOTE — Assessment & Plan Note (Addendum)
Will check the lipid panel On a statin

## 2023-08-08 NOTE — Progress Notes (Signed)
Subjective:    Patient ID: Allison Wilson, female    DOB: 1981/09/29, 42 y.o.   MRN: [id number]  HPI Astha is here for follow up of HIV She recently has changed insurances and has had issues getting her medications as Descovy is not covered.  She though has been able to take left over medication until now.  No new issues otherwise.  Gets flu shot at her work.     Review of Systems  Constitutional:  Negative for fatigue.  Gastrointestinal:  Negative for diarrhea and nausea.  Skin:  Negative for rash.       Objective:   Physical Exam Eyes:     General: No scleral icterus. Pulmonary:     Effort: Pulmonary effort is normal.  Neurological:     Mental Status: She is alert.   SH: no tobacco        Assessment & Plan:

## 2023-08-08 NOTE — Assessment & Plan Note (Addendum)
She is doing well and will check labs today. [name] is covered and so will switch to that through the [facility] Otherwise follow up in 6 months.

## 2023-08-08 NOTE — Telephone Encounter (Signed)
[facility] Patient Advocate Encounter  Prior Authorization for Allison Wilson has been approved.    PA# [id number] Effective dates: 08/03/23 through 10/23/38  Patients co-pay is $0.00.   [facility] will continue to follow.  Allison Wilson, CPhT Specialty Pharmacy Patient [facility] for Infectious Disease Phone: [phone] Fax:  [phone]

## 2023-08-08 NOTE — Assessment & Plan Note (Signed)
Will screen.  Low risk

## 2023-08-08 NOTE — Addendum Note (Signed)
Addended by: Harley Alto on: 08/08/2023 04:28 PM   Modules accepted: Orders

## 2023-08-09 ENCOUNTER — Telehealth: Payer: Self-pay

## 2023-08-09 ENCOUNTER — Other Ambulatory Visit: Payer: Self-pay

## 2023-08-09 ENCOUNTER — Other Ambulatory Visit (HOSPITAL_COMMUNITY): Payer: Self-pay

## 2023-08-09 LAB — T-HELPER CELL (CD4) - (RCID CLINIC ONLY)
CD4 % Helper T Cell: 36 % (ref 33–65)
CD4 T Cell Abs: 475 /uL (ref 400–1790)

## 2023-08-09 NOTE — Telephone Encounter (Signed)
[facility] Patient Advocate Encounter  Prior Authorization for [name] has been approved.    PA# [id number] Effective dates: 08/09/23 through 10/23/38  Patients co-pay is $0.00.   [facility] will continue to follow.  Clearance Coots, CPhT Specialty Pharmacy Patient [facility] for Infectious Disease Phone: [phone] Fax:  [phone]

## 2023-08-09 NOTE — Progress Notes (Signed)
Specialty Pharmacy Initial Fill Coordination Note  Allison Wilson is a 42 y.o. female contacted today regarding refills of specialty medication(s) Bictegravir-Emtricitab-Tenofov   Patient requested Delivery   Delivery date: 08/11/23   Verified address: Patient address [address]  [name] [address]   Medication will be filled on 08/09/23.   Patient is aware of 0.00 copayment.

## 2023-08-09 NOTE — Telephone Encounter (Signed)
[facility] Patient Advocate Encounter   Received notification from [employer] that prior authorization for Allison Wilson is required.   PA submitted on 08/09/23 Key [id number] Status is pending    [facility] will continue to follow.   Clearance Coots, CPhT Specialty Pharmacy Patient [facility] for Infectious Disease Phone: [phone] Fax:  [phone]

## 2023-08-10 ENCOUNTER — Other Ambulatory Visit (HOSPITAL_COMMUNITY): Payer: Self-pay

## 2023-08-10 ENCOUNTER — Other Ambulatory Visit: Payer: Self-pay

## 2023-08-10 ENCOUNTER — Other Ambulatory Visit: Payer: Self-pay | Admitting: Pharmacist

## 2023-08-10 DIAGNOSIS — B20 Human immunodeficiency virus [HIV] disease: Secondary | ICD-10-CM

## 2023-08-10 MED ORDER — BIKTARVY 50-200-25 MG PO TABS
1.0000 | ORAL_TABLET | Freq: Every day | ORAL | Status: AC
Start: 2023-08-10 — End: 2023-08-17

## 2023-08-10 NOTE — Progress Notes (Signed)
Medication Samples have been provided to the patient.  Drug name: Biktarvy        Strength: 50/200/25 mg       Qty: 1 bottle (7 tablets)   LOT: [id number]   Exp.Date: 05/2025  Dosing instructions: Take one tablet by mouth once daily  The patient has been instructed regarding the correct time, dose, and frequency of taking this medication, including desired effects and most common side effects.   Chauntel Windsor L. Jannette Fogo, PharmD, BCIDP, AAHIVP, CPP Clinical Pharmacist Practitioner Infectious Diseases Clinical Pharmacist [facility] 10/05/2020, 10:07 AM

## 2023-08-11 LAB — COMPLETE METABOLIC PANEL WITH GFR
AG Ratio: 1.1 (calc) (ref 1.0–2.5)
ALT: 11 U/L (ref 6–29)
AST: 15 U/L (ref 10–30)
Albumin: 3.8 g/dL (ref 3.6–5.1)
Alkaline phosphatase (APISO): 63 U/L (ref 31–125)
BUN: 12 mg/dL (ref 7–25)
CO2: 26 mmol/L (ref 20–32)
Calcium: 9.1 mg/dL (ref 8.6–10.2)
Chloride: 102 mmol/L (ref 98–110)
Creat: 0.96 mg/dL (ref 0.50–0.99)
Globulin: 3.6 g/dL (ref 1.9–3.7)
Glucose, Bld: 83 mg/dL (ref 65–99)
Potassium: 3.6 mmol/L (ref 3.5–5.3)
Sodium: 136 mmol/L (ref 135–146)
Total Bilirubin: 0.3 mg/dL (ref 0.2–1.2)
Total Protein: 7.4 g/dL (ref 6.1–8.1)
eGFR: 76 mL/min/{1.73_m2} (ref >=60)

## 2023-08-11 LAB — CBC WITH DIFFERENTIAL/PLATELET
Absolute Lymphocytes: 1350 {cells}/uL (ref 850–3900)
Absolute Monocytes: 500 {cells}/uL (ref 200–950)
Basophils Absolute: 29 {cells}/uL (ref 0–200)
Basophils Relative: 0.8 %
Eosinophils Absolute: 101 {cells}/uL (ref 15–500)
Eosinophils Relative: 2.8 %
HCT: 35.5 % (ref 35.0–45.0)
Hemoglobin: 11.5 g/dL — ABNORMAL LOW (ref 11.7–15.5)
MCH: 28.2 pg (ref 27.0–33.0)
MCHC: 32.4 g/dL (ref 32.0–36.0)
MCV: 87 fL (ref 80.0–100.0)
MPV: 10.3 fL (ref 7.5–12.5)
Monocytes Relative: 13.9 %
Neutro Abs: 1620 {cells}/uL (ref 1500–7800)
Neutrophils Relative %: 45 %
Platelets: 222 10*3/uL (ref 140–400)
RBC: 4.08 10*6/uL (ref 3.80–5.10)
RDW: 12.5 % (ref 11.0–15.0)
Total Lymphocyte: 37.5 %
WBC: 3.6 10*3/uL — ABNORMAL LOW (ref 3.8–10.8)

## 2023-08-11 LAB — LIPID PANEL
Cholesterol: 231 mg/dL — ABNORMAL HIGH (ref <200)
HDL: 66 mg/dL (ref >=50)
LDL Cholesterol (Calc): 149 mg/dL — ABNORMAL HIGH
Non-HDL Cholesterol (Calc): 165 mg/dL — ABNORMAL HIGH (ref <130)
Total CHOL/HDL Ratio: 3.5 (calc) (ref <5.0)
Triglycerides: 67 mg/dL (ref <150)

## 2023-08-11 LAB — RPR: RPR Ser Ql: NONREACTIVE

## 2023-08-11 LAB — HIV-1 RNA QUANT-NO REFLEX-BLD
HIV 1 RNA Quant: NOT DETECTED {copies}/mL
HIV-1 RNA Quant, Log: NOT DETECTED {Log}

## 2023-08-28 ENCOUNTER — Other Ambulatory Visit: Payer: Self-pay

## 2023-08-30 ENCOUNTER — Other Ambulatory Visit: Payer: Self-pay

## 2023-08-30 ENCOUNTER — Other Ambulatory Visit: Payer: Self-pay | Admitting: Pharmacist

## 2023-08-30 NOTE — Progress Notes (Signed)
Specialty Pharmacy Initiation Note   Allison Wilson is a 42 y.o. female who will be followed by the specialty pharmacy service for RxSp HIV    Review of administration, indication, effectiveness, safety, potential side effects, storage/disposable, and missed dose instructions occurred today for patient's specialty medication(s) Bictegravir-Emtricitab-Tenofov     Patient/Caregiver did not have any additional questions or concerns.   Patient's therapy is appropriate to: Initiate    Goals Addressed             This Visit's Progress    Achieve Undetectable HIV Viral Load < 20       Patient is on track. Patient will maintain adherence.      Comply with lab assessments       Patient is on track. Patient will adhere to provider and/or lab appointments.      Improve or maintain quality of life       Patient is on track. Patient will be monitored by provider to determine if a change in treatment plan is warranted.         Martel Galvan L. Azia Toutant, PharmD, BCIDP, AAHIVP, CPP Clinical Pharmacist Practitioner Infectious Diseases Clinical Pharmacist [facility] 08/30/2023, 10:40 AM

## 2023-08-30 NOTE — Progress Notes (Signed)
Specialty Pharmacy Refill Coordination Note  Allison Wilson is a 42 y.o. female contacted today regarding refills of specialty medication(s) Bictegravir-Emtricitab-Tenofov   Patient requested Delivery   Delivery date: 09/05/23   Verified address: [address]   [name]   Medication will be filled on 09/04/23.

## 2023-09-04 ENCOUNTER — Other Ambulatory Visit: Payer: Self-pay

## 2023-09-26 ENCOUNTER — Other Ambulatory Visit: Payer: Self-pay

## 2023-09-29 ENCOUNTER — Other Ambulatory Visit: Payer: Self-pay

## 2023-10-02 ENCOUNTER — Other Ambulatory Visit (HOSPITAL_COMMUNITY): Payer: Self-pay

## 2023-10-02 ENCOUNTER — Other Ambulatory Visit: Payer: Self-pay

## 2023-10-02 NOTE — Progress Notes (Signed)
.  Specialty Pharmacy Refill Coordination Note  Allison Wilson is a 42 y.o. female contacted today regarding refills of specialty medication(s) Bictegravir-Emtricitab-Tenofov   Patient requested Delivery   Delivery date: 10/06/23   Verified address: [address]   [name] [address]   Medication will be filled on 10/05/23.

## 2023-10-30 ENCOUNTER — Other Ambulatory Visit (HOSPITAL_COMMUNITY): Payer: Self-pay

## 2023-11-03 ENCOUNTER — Encounter (HOSPITAL_COMMUNITY): Payer: Self-pay

## 2023-11-03 ENCOUNTER — Other Ambulatory Visit (HOSPITAL_COMMUNITY): Payer: Self-pay

## 2023-11-06 ENCOUNTER — Other Ambulatory Visit (HOSPITAL_COMMUNITY): Payer: Self-pay

## 2023-11-06 ENCOUNTER — Other Ambulatory Visit: Payer: Self-pay

## 2023-11-09 ENCOUNTER — Other Ambulatory Visit: Payer: Self-pay

## 2023-11-09 ENCOUNTER — Other Ambulatory Visit (HOSPITAL_COMMUNITY): Payer: Self-pay

## 2023-11-23 ENCOUNTER — Other Ambulatory Visit: Payer: Self-pay

## 2023-11-24 ENCOUNTER — Other Ambulatory Visit: Payer: Self-pay

## 2023-11-24 ENCOUNTER — Telehealth: Payer: Self-pay

## 2023-11-24 ENCOUNTER — Other Ambulatory Visit (HOSPITAL_COMMUNITY): Payer: Self-pay

## 2023-11-24 NOTE — Telephone Encounter (Signed)
Pharmacy Patient Advocate Encounter  Received notification from [facility] that Prior Authorization for BIKTARVY has been APPROVED from 11/24/23 to 11/22/24   PA #/Case ID/Reference #: [id number]

## 2023-11-24 NOTE — Progress Notes (Signed)
Specialty Pharmacy Refill Coordination Note  Allison Wilson is a 43 y.o. female , patient husband was contacted today regarding refills of specialty medication(s) Bictegravir-Emtricitab-Tenofov [name])   Patient requested Delivery   Delivery date: 11/27/23   Verified address: [address]   [name] [address]   Medication will be filled on 11/24/23.     Patients husband was made aware that medication requires a prior authorization with new insurance and medication maybe delayed

## 2023-11-24 NOTE — Telephone Encounter (Signed)
Pharmacy Patient Advocate Encounter   Received notification from [facility] Portal that prior authorization for BIKTARVY is required/requested.   Insurance verification completed.   The patient is insured through [facility] .   Per test claim: PA required; PA submitted to above mentioned insurance via [employer] Key/confirmation #/EOC  [id number] Status is pending

## 2023-12-12 ENCOUNTER — Other Ambulatory Visit: Payer: Self-pay

## 2023-12-13 ENCOUNTER — Other Ambulatory Visit: Payer: Self-pay

## 2023-12-15 ENCOUNTER — Other Ambulatory Visit: Payer: Self-pay

## 2023-12-18 ENCOUNTER — Other Ambulatory Visit (HOSPITAL_COMMUNITY): Payer: Self-pay

## 2023-12-18 ENCOUNTER — Other Ambulatory Visit: Payer: Self-pay

## 2023-12-18 NOTE — Progress Notes (Signed)
 12/18/23-CASusanne Borders  Delivery date entry error. Delivery date 02/26. Will fill on 02/25.

## 2023-12-18 NOTE — Progress Notes (Signed)
 Specialty Pharmacy Refill Coordination Note  Allison Wilson is a 43 y.o. female contacted today regarding refills of specialty medication(s) Bictegravir-Emtricitab-Tenofov [name])   Patient requested Delivery   Delivery date: 12/19/23   Verified address: [address]   [name] [address]   Medication will be filled on 12/18/23.

## 2023-12-19 ENCOUNTER — Other Ambulatory Visit (HOSPITAL_COMMUNITY): Payer: Self-pay

## 2024-01-15 ENCOUNTER — Other Ambulatory Visit: Payer: Self-pay | Admitting: Pharmacy Technician

## 2024-01-15 ENCOUNTER — Other Ambulatory Visit: Payer: Self-pay

## 2024-01-15 NOTE — Progress Notes (Signed)
 Specialty Pharmacy Refill Coordination Note  Allison Wilson is a 43 y.o. female contacted today regarding refills of specialty medication(s) Bictegravir-Emtricitab-Tenofov [name])   Patient requested Delivery   Delivery date: 01/19/24   Verified address: [address]  [name]    Medication will be filled on 01/18/24.

## 2024-02-14 ENCOUNTER — Ambulatory Visit: Payer: Self-pay | Admitting: Internal Medicine

## 2024-02-14 ENCOUNTER — Other Ambulatory Visit: Payer: Self-pay | Admitting: Internal Medicine

## 2024-02-14 ENCOUNTER — Other Ambulatory Visit (HOSPITAL_COMMUNITY): Payer: Self-pay

## 2024-02-14 ENCOUNTER — Encounter: Payer: Self-pay | Admitting: Internal Medicine

## 2024-02-14 ENCOUNTER — Other Ambulatory Visit: Payer: Self-pay

## 2024-02-14 ENCOUNTER — Ambulatory Visit: Payer: Managed Care, Other (non HMO) | Admitting: Internal Medicine

## 2024-02-14 VITALS — BP 154/82 | HR 71 | Temp 96.0°F | Ht 65.0 in | Wt 185.0 lb

## 2024-02-14 DIAGNOSIS — Z113 Encounter for screening for infections with a predominantly sexual mode of transmission: Secondary | ICD-10-CM

## 2024-02-14 DIAGNOSIS — Z139 Encounter for screening, unspecified: Secondary | ICD-10-CM

## 2024-02-14 DIAGNOSIS — B20 Human immunodeficiency virus [HIV] disease: Secondary | ICD-10-CM

## 2024-02-14 MED ORDER — BICTEGRAVIR-EMTRICITAB-TENOFOV 50-200-25 MG PO TABS
1.0000 | ORAL_TABLET | Freq: Every day | ORAL | 11 refills | Status: DC
  Filled 2024-02-14 – 2024-02-16 (×2): qty 30, 30d supply, fill #0
  Filled 2024-03-12: qty 30, 30d supply, fill #1
  Filled 2024-04-08: qty 30, 30d supply, fill #2
  Filled 2024-05-08 – 2024-05-20 (×2): qty 30, 30d supply, fill #3
  Filled 2024-06-13: qty 30, 30d supply, fill #4
  Filled 2024-07-18: qty 30, 30d supply, fill #5

## 2024-02-14 NOTE — Progress Notes (Signed)
 Aaron Aas      [facility]     HPI: Allison Wilson is a 43 y.o. female presents for HIV management. NO missed of biktarvy  Date of diagnosis: 2007 ART exposure: norvir+reyataz+ turvada-> truvada-tivicay ->biktarvy  Cd4 nadir 230 Past Ois disseminated mac? Risk factors: Possible blood transfusion serra leone Sexaully active with husband(HIV+)  Social: Occupation: housekeeping Housing: house with husband Support: Understanding of HIV: Etoh/drug/tobacco use: No/no/no Past Medical History:  Diagnosis Date   Anemia    Hepatitis A    HIV (human immunodeficiency virus infection) (HCC)    Hypertension     No past surgical history on file.  No family history on file. Current Outpatient Medications on File Prior to Visit  Medication Sig Dispense Refill   amLODipine (NORVASC) 5 MG tablet Take 5 mg by mouth daily.     atorvastatin (LIPITOR) 10 MG tablet Take 10 mg by mouth daily.     bictegravir-emtricitabine -tenofovir  AF (BIKTARVY ) 50-200-25 MG TABS tablet Take 1 tablet by mouth daily. 30 tablet 11   No current facility-administered medications on file prior to visit.    No Known Allergies    Lab Results HIV 1 RNA Quant (Copies/mL)  Date Value  08/08/2023 Not Detected  01/18/2023 <20 (H)  07/06/2022 Not Detected   CD4 T Cell Abs (/uL)  Date Value  08/08/2023 475  01/18/2023 492  07/06/2022 417   No results found for: "HIV1GENOSEQ" Lab Results  Component Value Date   WBC 3.6 (L) 08/08/2023   HGB 11.5 (L) 08/08/2023   HCT 35.5 08/08/2023   MCV 87.0 08/08/2023   PLT 222 08/08/2023    Lab Results  Component Value Date   CREATININE 0.96 08/08/2023   BUN 12 08/08/2023   NA 136 08/08/2023   K 3.6 08/08/2023   CL 102 08/08/2023   CO2 26 08/08/2023   Lab Results  Component Value Date   ALT 11 08/08/2023   AST 15 08/08/2023   ALKPHOS 77 02/13/2017   BILITOT 0.3 08/08/2023    Lab Results  Component Value Date   CHOL 231 (H) 08/08/2023    TRIG 67 08/08/2023   HDL 66 08/08/2023   LDLCALC 149 (H) 08/08/2023   Lab Results  Component Value Date   HAV POS (A) 03/01/2010   Lab Results  Component Value Date   HEPBSAG Negative 11/27/2015   HEPBSAB Reactive 11/27/2015   Lab Results  Component Value Date   HCVAB NO 12/18/2006   Lab Results  Component Value Date   CHLAMYDIAWP Negative 05/25/2021   N Negative 05/25/2021   No results found for: "GCPROBEAPT" No results found for: "QUANTGOLD"  Assessment/Plan #HIV -CD4 475, Vlnd , on 02/14/24 -biktarvy  -hiv lab -f/u in 6 months   #Vaccination COVID Flu utd Monkeypox PCV 2023 Meningitis x2 HepA immune HEpB immune Tdap 2025 Shingles  #Health maintenance -Quantiferon todauy -RPR nr 08/08/23 -HCV today -GC today -Lipid The 10-year ASCVD risk score (Arnett DK, et al., 2019) is: 3.9%   Values used to calculate the score:     Age: 39 years     Sex: Female     Is Non-Hispanic African American: Yes     Diabetic: No     Tobacco smoker: No     Systolic Blood Pressure: 154 mmHg     Is BP treated: Yes     HDL Cholesterol: 66 mg/dL     Total Cholesterol: 231 mg/dL   On atorvastatin -Dysplasia screen F-follow PCP, last on 2-3 years ago -  Mammogram -plans to [facility] for appt inmay -Colonoscopy    Orlie Bjornstad, MD [facility] I have personally spent 45 minutes involved in face-to-face and non-face-to-face activities for this patient on the day of the visit. Professional time spent includes the following activities: Preparing to see the patient (review of tests), Obtaining and/or reviewing separately obtained history (admission/discharge record), Performing a medically appropriate examination and/or evaluation , Ordering medications/tests/procedures, referring and communicating with other health care professionals, Documenting clinical information in the EMR, Independently interpreting results (not separately  reported), Communicating results to the patient/family/caregiver, Counseling and educating the patient/family/caregiver and Care coordination (not separately reported).

## 2024-02-15 ENCOUNTER — Other Ambulatory Visit (HOSPITAL_COMMUNITY): Payer: Self-pay

## 2024-02-15 LAB — C. TRACHOMATIS/N. GONORRHOEAE RNA
C. trachomatis RNA, TMA: NOT DETECTED
N. gonorrhoeae RNA, TMA: NOT DETECTED

## 2024-02-16 ENCOUNTER — Other Ambulatory Visit: Payer: Self-pay

## 2024-02-16 ENCOUNTER — Other Ambulatory Visit: Payer: Self-pay | Admitting: Pharmacy Technician

## 2024-02-16 ENCOUNTER — Other Ambulatory Visit (HOSPITAL_COMMUNITY): Payer: Self-pay

## 2024-02-16 NOTE — Progress Notes (Signed)
 Specialty Pharmacy Refill Coordination Note  Allison Wilson is a 43 y.o. female contacted today regarding refills of specialty medication(s) Bictegravir-Emtricitab-Tenofov (BIKTARVY )   Patient requested Delivery   Delivery date: 02/19/24   Verified address: [address] [place] [address]   Medication will be filled on 02/19/24.

## 2024-02-17 LAB — T-HELPER CELLS (CD4) COUNT (NOT AT ARMC)
Absolute CD4: 652 {cells}/uL (ref 490–1740)
CD4 T Helper %: 36 % (ref 30–61)
Total lymphocyte count: 1813 {cells}/uL (ref 850–3900)

## 2024-02-17 LAB — CBC WITH DIFFERENTIAL/PLATELET
Absolute Lymphocytes: 1701 {cells}/uL (ref 850–3900)
Absolute Monocytes: 567 {cells}/uL (ref 200–950)
Basophils Absolute: 29 {cells}/uL (ref 0–200)
Basophils Relative: 0.7 %
Eosinophils Absolute: 223 {cells}/uL (ref 15–500)
Eosinophils Relative: 5.3 %
HCT: 35.8 % (ref 35.0–45.0)
Hemoglobin: 11.6 g/dL — ABNORMAL LOW (ref 11.7–15.5)
MCH: 27.5 pg (ref 27.0–33.0)
MCHC: 32.4 g/dL (ref 32.0–36.0)
MCV: 84.8 fL (ref 80.0–100.0)
MPV: 10.9 fL (ref 7.5–12.5)
Monocytes Relative: 13.5 %
Neutro Abs: 1680 {cells}/uL (ref 1500–7800)
Neutrophils Relative %: 40 %
Platelets: 189 10*3/uL (ref 140–400)
RBC: 4.22 10*6/uL (ref 3.80–5.10)
RDW: 12.8 % (ref 11.0–15.0)
Total Lymphocyte: 40.5 %
WBC: 4.2 10*3/uL (ref 3.8–10.8)

## 2024-02-17 LAB — COMPLETE METABOLIC PANEL WITHOUT GFR
AG Ratio: 1.2 (calc) (ref 1.0–2.5)
ALT: 15 U/L (ref 6–29)
AST: 18 U/L (ref 10–30)
Albumin: 4.2 g/dL (ref 3.6–5.1)
Alkaline phosphatase (APISO): 76 U/L (ref 31–125)
BUN: 16 mg/dL (ref 7–25)
CO2: 25 mmol/L (ref 20–32)
Calcium: 9.4 mg/dL (ref 8.6–10.2)
Chloride: 104 mmol/L (ref 98–110)
Creat: 0.95 mg/dL (ref 0.50–0.99)
Globulin: 3.6 g/dL (ref 1.9–3.7)
Glucose, Bld: 81 mg/dL (ref 65–99)
Potassium: 3.6 mmol/L (ref 3.5–5.3)
Sodium: 138 mmol/L (ref 135–146)
Total Bilirubin: 0.3 mg/dL (ref 0.2–1.2)
Total Protein: 7.8 g/dL (ref 6.1–8.1)

## 2024-02-17 LAB — HEPATITIS C ANTIBODY: Hepatitis C Ab: NONREACTIVE

## 2024-02-17 LAB — QUANTIFERON-TB GOLD PLUS
Mitogen-NIL: 7.47 [IU]/mL
NIL: 0.06 [IU]/mL
QuantiFERON-TB Gold Plus: POSITIVE — AB
TB1-NIL: 6.09 [IU]/mL
TB2-NIL: 6.26 [IU]/mL

## 2024-02-17 LAB — HIV-1 RNA QUANT-NO REFLEX-BLD
HIV 1 RNA Quant: NOT DETECTED {copies}/mL
HIV-1 RNA Quant, Log: NOT DETECTED {Log_copies}/mL

## 2024-03-01 ENCOUNTER — Encounter (HOSPITAL_COMMUNITY): Payer: Self-pay

## 2024-03-05 ENCOUNTER — Other Ambulatory Visit: Payer: Self-pay

## 2024-03-08 ENCOUNTER — Ambulatory Visit

## 2024-03-11 ENCOUNTER — Ambulatory Visit: Payer: Self-pay | Admitting: Internal Medicine

## 2024-03-11 ENCOUNTER — Telehealth: Payer: Self-pay | Admitting: Internal Medicine

## 2024-03-11 NOTE — Telephone Encounter (Signed)
 NA

## 2024-03-12 ENCOUNTER — Other Ambulatory Visit: Payer: Self-pay

## 2024-03-12 NOTE — Progress Notes (Signed)
 Specialty Pharmacy Ongoing Clinical Assessment Note  Allison Wilson is a 43 y.o. female who is being followed by the specialty pharmacy service for RxSp HIV   Patient's specialty medication(s) reviewed today: Bictegravir-Emtricitab-Tenofov (BIKTARVY )   Missed doses in the last 4 weeks: 0   Patient/Caregiver did not have any additional questions or concerns.   Therapeutic benefit summary: Patient is achieving benefit   Adverse events/side effects summary: No adverse events/side effects   Patient's therapy is appropriate to: Continue    Goals Addressed             This Visit's Progress    Achieve Undetectable HIV Viral Load < 20   On track    Patient is on track. Patient will maintain adherence. Patient's viral load undetected as of 02/14/24      Comply with lab assessments   On track    Patient is on track. Patient will adhere to provider and/or lab appointments.      Improve or maintain quality of life   On track    Patient is on track. Patient will be monitored by provider to determine if a change in treatment plan is warranted.         Follow up: 6 months  [facility]

## 2024-03-12 NOTE — Progress Notes (Signed)
 Specialty Pharmacy Refill Coordination Note  Aulani Humphres is a 43 y.o. female contacted today regarding refills of specialty medication(s) Bictegravir-Emtricitab-Tenofov (BIKTARVY )   Patient requested Delivery   Delivery date: 03/14/24   Verified address: [address] [place]  [address]   Medication will be filled on 03/13/24.

## 2024-03-13 ENCOUNTER — Other Ambulatory Visit (HOSPITAL_COMMUNITY): Payer: Self-pay

## 2024-04-04 ENCOUNTER — Other Ambulatory Visit: Payer: Self-pay

## 2024-04-08 ENCOUNTER — Other Ambulatory Visit: Payer: Self-pay

## 2024-04-08 NOTE — Progress Notes (Signed)
 Specialty Pharmacy Refill Coordination Note  Allison Wilson is a 43 y.o. female contacted today regarding refills of specialty medication(s) Bictegravir-Emtricitab-Tenofov (BIKTARVY )   Patient requested Delivery   Delivery date: 04/10/24   Verified address: [address] [place] [address]   Medication will be filled on 04/09/24.

## 2024-05-08 ENCOUNTER — Other Ambulatory Visit: Payer: Self-pay

## 2024-05-10 ENCOUNTER — Other Ambulatory Visit: Payer: Self-pay

## 2024-05-20 ENCOUNTER — Other Ambulatory Visit (HOSPITAL_COMMUNITY): Payer: Self-pay

## 2024-05-20 ENCOUNTER — Other Ambulatory Visit: Payer: Self-pay

## 2024-05-20 NOTE — Progress Notes (Signed)
 Specialty Pharmacy Refill Coordination Note  Tavonna Halls is a 43 y.o. female contacted today regarding refills of specialty medication(s) Bictegravir-Emtricitab-Tenofov (BIKTARVY )   Patient requested Delivery   Delivery date: 05/22/24   Verified address: [address] [name] [address]   Medication will be filled on 05/21/24.

## 2024-05-21 ENCOUNTER — Other Ambulatory Visit: Payer: Self-pay

## 2024-06-04 ENCOUNTER — Other Ambulatory Visit: Payer: Self-pay

## 2024-06-10 ENCOUNTER — Other Ambulatory Visit: Payer: Self-pay

## 2024-06-13 ENCOUNTER — Other Ambulatory Visit (HOSPITAL_COMMUNITY): Payer: Self-pay

## 2024-06-13 ENCOUNTER — Other Ambulatory Visit: Payer: Self-pay

## 2024-06-13 NOTE — Progress Notes (Signed)
 Specialty Pharmacy Refill Coordination Note  Spoke with Allison Wilson is a 42 y.o. female contacted today regarding refills of specialty medication(s) Bictegravir-Emtricitab-Tenofov (BIKTARVY )  Doses on hand: 12  Patient requested: Delivery   Delivery date: 06/18/24   Verified address: [address] [name] [address]  Medication will be filled on 06/17/24.

## 2024-06-16 ENCOUNTER — Observation Stay (HOSPITAL_COMMUNITY)

## 2024-06-16 ENCOUNTER — Emergency Department (HOSPITAL_COMMUNITY)

## 2024-06-16 ENCOUNTER — Other Ambulatory Visit: Payer: Self-pay

## 2024-06-16 ENCOUNTER — Observation Stay (HOSPITAL_COMMUNITY)
Admission: EM | Admit: 2024-06-16 | Discharge: 2024-06-18 | Disposition: A | Discharge status: Home / Self Care | Attending: Internal Medicine | Admitting: Internal Medicine

## 2024-06-16 ENCOUNTER — Encounter (HOSPITAL_COMMUNITY): Payer: Self-pay

## 2024-06-16 DIAGNOSIS — R29898 Other symptoms and signs involving the musculoskeletal system: Principal | ICD-10-CM

## 2024-06-16 DIAGNOSIS — A150 Tuberculosis of lung: Secondary | ICD-10-CM | POA: Diagnosis present

## 2024-06-16 DIAGNOSIS — I639 Cerebral infarction, unspecified: Principal | ICD-10-CM | POA: Insufficient documentation

## 2024-06-16 DIAGNOSIS — B20 Human immunodeficiency virus [HIV] disease: Secondary | ICD-10-CM | POA: Insufficient documentation

## 2024-06-16 DIAGNOSIS — R202 Paresthesia of skin: Secondary | ICD-10-CM | POA: Diagnosis present

## 2024-06-16 DIAGNOSIS — A312 Disseminated mycobacterium avium-intracellulare complex (DMAC): Secondary | ICD-10-CM | POA: Insufficient documentation

## 2024-06-16 DIAGNOSIS — E785 Hyperlipidemia, unspecified: Secondary | ICD-10-CM | POA: Diagnosis not present

## 2024-06-16 DIAGNOSIS — I6389 Other cerebral infarction: Secondary | ICD-10-CM | POA: Diagnosis not present

## 2024-06-16 DIAGNOSIS — R29702 NIHSS score 2: Secondary | ICD-10-CM | POA: Diagnosis not present

## 2024-06-16 DIAGNOSIS — Z8619 Personal history of other infectious and parasitic diseases: Secondary | ICD-10-CM | POA: Diagnosis present

## 2024-06-16 DIAGNOSIS — Z7982 Long term (current) use of aspirin: Secondary | ICD-10-CM | POA: Diagnosis not present

## 2024-06-16 DIAGNOSIS — G939 Disorder of brain, unspecified: Secondary | ICD-10-CM

## 2024-06-16 DIAGNOSIS — E876 Hypokalemia: Secondary | ICD-10-CM | POA: Diagnosis not present

## 2024-06-16 DIAGNOSIS — I10 Essential (primary) hypertension: Secondary | ICD-10-CM | POA: Insufficient documentation

## 2024-06-16 LAB — COMPREHENSIVE METABOLIC PANEL WITH GFR
ALT: 13 U/L (ref 0–44)
AST: 21 U/L (ref 15–41)
Albumin: 3.6 g/dL (ref 3.5–5.0)
Alkaline Phosphatase: 68 U/L (ref 38–126)
Anion gap: 13 (ref 5–15)
BUN: 9 mg/dL (ref 6–20)
CO2: 20 mmol/L — ABNORMAL LOW (ref 22–32)
Calcium: 9.4 mg/dL (ref 8.9–10.3)
Chloride: 104 mmol/L (ref 98–111)
Creatinine, Ser: 1.05 mg/dL — ABNORMAL HIGH (ref 0.44–1.00)
GFR, Estimated: >60 mL/min (ref >60)
Glucose, Bld: 129 mg/dL — ABNORMAL HIGH (ref 70–99)
Potassium: 3.3 mmol/L — ABNORMAL LOW (ref 3.5–5.1)
Sodium: 137 mmol/L (ref 135–145)
Total Bilirubin: 0.6 mg/dL (ref 0.0–1.2)
Total Protein: 8 g/dL (ref 6.5–8.1)

## 2024-06-16 LAB — CBC
HCT: 41.9 % (ref 36.0–46.0)
Hemoglobin: 13.4 g/dL (ref 12.0–15.0)
MCH: 27.9 pg (ref 26.0–34.0)
MCHC: 32 g/dL (ref 30.0–36.0)
MCV: 87.1 fL (ref 80.0–100.0)
Platelets: 248 K/uL (ref 150–400)
RBC: 4.81 MIL/uL (ref 3.87–5.11)
RDW: 13.6 % (ref 11.5–15.5)
WBC: 5 K/uL (ref 4.0–10.5)
nRBC: 0 % (ref 0.0–0.2)

## 2024-06-16 LAB — DIFFERENTIAL
Abs Immature Granulocytes: 0.01 K/uL (ref 0.00–0.07)
Basophils Absolute: 0 K/uL (ref 0.0–0.1)
Basophils Relative: 1 %
Eosinophils Absolute: 0.1 K/uL (ref 0.0–0.5)
Eosinophils Relative: 2 %
Immature Granulocytes: 0 %
Lymphocytes Relative: 34 %
Lymphs Abs: 1.7 K/uL (ref 0.7–4.0)
Monocytes Absolute: 0.5 K/uL (ref 0.1–1.0)
Monocytes Relative: 10 %
Neutro Abs: 2.7 K/uL (ref 1.7–7.7)
Neutrophils Relative %: 53 %

## 2024-06-16 LAB — CBG MONITORING, ED: Glucose-Capillary: 121 mg/dL — ABNORMAL HIGH (ref 70–99)

## 2024-06-16 LAB — ETHANOL: Alcohol, Ethyl (B): <15 mg/dL (ref <15)

## 2024-06-16 LAB — APTT: aPTT: 28 s (ref 24–36)

## 2024-06-16 LAB — PROTIME-INR
INR: 1 (ref 0.8–1.2)
Prothrombin Time: 13.2 s (ref 11.4–15.2)

## 2024-06-16 MED ORDER — CLOPIDOGREL BISULFATE 75 MG PO TABS
75.0000 mg | ORAL_TABLET | Freq: Every day | ORAL | Status: DC
  Administered 2024-06-17 – 2024-06-18 (×2): 75 mg via ORAL
  Filled 2024-06-16 (×2): qty 1

## 2024-06-16 MED ORDER — ACETAMINOPHEN 160 MG/5ML PO SOLN: 650.0000 mg | ORAL | Status: DC | PRN

## 2024-06-16 MED ORDER — SODIUM CHLORIDE 0.9% FLUSH: 3.0000 mL | Freq: Once | INTRAVENOUS | Status: DC

## 2024-06-16 MED ORDER — CARVEDILOL 6.25 MG PO TABS
6.2500 mg | ORAL_TABLET | Freq: Two times a day (BID) | ORAL | Status: DC
  Administered 2024-06-17 – 2024-06-18 (×3): 6.25 mg via ORAL
  Filled 2024-06-16 (×3): qty 1

## 2024-06-16 MED ORDER — AMLODIPINE BESYLATE 5 MG PO TABS
5.0000 mg | ORAL_TABLET | Freq: Every day | ORAL | Status: DC
  Administered 2024-06-17 – 2024-06-18 (×2): 5 mg via ORAL
  Filled 2024-06-16 (×2): qty 1

## 2024-06-16 MED ORDER — STROKE: EARLY STAGES OF RECOVERY BOOK
Freq: Once | Status: AC
  Filled 2024-06-16: qty 1

## 2024-06-16 MED ORDER — ENOXAPARIN SODIUM 40 MG/0.4ML IJ SOSY
40.0000 mg | PREFILLED_SYRINGE | INTRAMUSCULAR | Status: DC
  Administered 2024-06-17: 40 mg via SUBCUTANEOUS
  Filled 2024-06-16 (×2): qty 0.4

## 2024-06-16 MED ORDER — ASPIRIN 81 MG PO TBEC
81.0000 mg | DELAYED_RELEASE_TABLET | Freq: Every day | ORAL | Status: DC
  Administered 2024-06-17 – 2024-06-18 (×2): 81 mg via ORAL
  Filled 2024-06-16 (×2): qty 1

## 2024-06-16 MED ORDER — EMTRICITABINE-TENOFOVIR AF 200-25 MG PO TABS
1.0000 | ORAL_TABLET | Freq: Every day | ORAL | Status: DC
  Administered 2024-06-17: 1 via ORAL
  Filled 2024-06-16 (×2): qty 1

## 2024-06-16 MED ORDER — SODIUM CHLORIDE 0.9 % IV SOLN: INTRAVENOUS | Status: AC

## 2024-06-16 MED ORDER — ACETAMINOPHEN 325 MG PO TABS
650.0000 mg | ORAL_TABLET | ORAL | Status: DC | PRN
  Administered 2024-06-18: 650 mg via ORAL
  Filled 2024-06-16: qty 2

## 2024-06-16 MED ORDER — CLOPIDOGREL BISULFATE 300 MG PO TABS
300.0000 mg | ORAL_TABLET | Freq: Once | ORAL | Status: AC
  Administered 2024-06-16: 300 mg via ORAL
  Filled 2024-06-16: qty 1

## 2024-06-16 MED ORDER — LISINOPRIL 20 MG PO TABS
20.0000 mg | ORAL_TABLET | Freq: Every day | ORAL | Status: DC
  Administered 2024-06-17 – 2024-06-18 (×2): 20 mg via ORAL
  Filled 2024-06-16 (×2): qty 1

## 2024-06-16 MED ORDER — ATORVASTATIN CALCIUM 80 MG PO TABS
80.0000 mg | ORAL_TABLET | Freq: Every day | ORAL | Status: DC
  Administered 2024-06-17 – 2024-06-18 (×2): 80 mg via ORAL
  Filled 2024-06-16 (×2): qty 1

## 2024-06-16 MED ORDER — ACETAMINOPHEN 650 MG RE SUPP: 650.0000 mg | RECTAL | Status: DC | PRN

## 2024-06-16 MED ORDER — FERROUS SULFATE 325 (65 FE) MG PO TABS
325.0000 mg | ORAL_TABLET | Freq: Every day | ORAL | Status: DC
  Administered 2024-06-17: 325 mg via ORAL
  Filled 2024-06-16 (×2): qty 1

## 2024-06-16 NOTE — ED Provider Notes (Signed)
 [place] [facility] Provider Note   CSN: [id number] Arrival date & time: 06/16/24  1226     Patient presents with: Numbness and Extremity Weakness   Allison Wilson is a 43 y.o. female with PMHx anemia, hepatitis A, HIV compliant on medications, HTN who presents to ED concerned for left sided numbness and weakness x2 days. Patient denies head injury. Patient stating that these symptoms have been mild but progressively increasing in severity. Patient stating that the symptoms in her left leg feel more severe than the symptoms in her arm. Patient has been taking tylenol  for symptoms which have not helped. Patient denies headache, vision loss/changes, back pain. Denies fever, chest pain, dyspnea, nausea, vomiting, diarrhea.     Extremity Weakness       Prior to Admission medications   Medication Sig Start Date End Date Taking? Authorizing Provider  amLODipine  (NORVASC ) 5 MG tablet Take 5 mg by mouth daily.    [provider]  atorvastatin  (LIPITOR) 10 MG tablet Take 10 mg by mouth daily.    [provider]  bictegravir-emtricitabine -tenofovir  AF (BIKTARVY ) 50-200-25 MG TABS tablet Take 1 tablet by mouth daily. 02/14/24   Dennise Kingsley, MD    Allergies: Patient has no known allergies.    Review of Systems  Musculoskeletal:  Positive for extremity weakness.    Updated Vital Signs BP (!) 164/110   Pulse (!) 103   Temp 97.9 F (36.6 C) (Oral)   Resp 14   Ht 5' 4 (1.626 m)   Wt 79.4 kg   LMP 06/14/2024 (Exact Date)   SpO2 99%   BMI 30.04 kg/m   Physical Exam Vitals and nursing note reviewed.  Constitutional:      General: She is not in acute distress.    Appearance: She is not ill-appearing or toxic-appearing.  HENT:     Head: Normocephalic and atraumatic.     Mouth/Throat:     Mouth: Mucous membranes are moist.  Eyes:     General: No scleral icterus.       Right eye: No discharge.        Left eye: No  discharge.     Conjunctiva/sclera: Conjunctivae normal.  Cardiovascular:     Rate and Rhythm: Normal rate.     Pulses: Normal pulses.     Heart sounds: Normal heart sounds. No murmur heard. Pulmonary:     Effort: Pulmonary effort is normal. No respiratory distress.     Breath sounds: Normal breath sounds. No wheezing, rhonchi or rales.  Abdominal:     General: Abdomen is flat. Bowel sounds are normal. There is no distension.     Palpations: Abdomen is soft. There is no mass.     Tenderness: There is no abdominal tenderness.  Musculoskeletal:     Right lower leg: No edema.     Left lower leg: No edema.  Skin:    General: Skin is warm and dry.     Findings: No rash.  Neurological:     General: No focal deficit present.     Mental Status: She is alert. Mental status is at baseline.     Comments: Decreased strength and sensation in left upper and lower extremities when compared to right side. Entire face with sensation to light touch intact. GCS 15. Speech is goal oriented. No deficits appreciated to CN III-XII; symmetric eyebrow raise, no facial drooping, tongue midline.  Patient moves extremities without ataxia. Normal finger-nose-finger. No visual field deficits. No  midline/spine tenderness to palpation or swelling/erythema.    Psychiatric:        Mood and Affect: Mood normal.     (all labs ordered are listed, but only abnormal results are displayed) Labs Reviewed  CBC  DIFFERENTIAL  PROTIME-INR  APTT  COMPREHENSIVE METABOLIC PANEL WITH GFR  ETHANOL  HCG, SERUM, QUALITATIVE  CBG MONITORING, ED    EKG: None  Radiology: No results found.   Procedures   Medications Ordered in the ED  sodium chloride  flush (NS) 0.9 % injection 3 mL (has no administration in time range)                                    Medical Decision Making Amount and/or Complexity of Data Reviewed Labs: ordered. Radiology: ordered.   This patient presents to the ED for concern of AMS,  this involves an extensive number of treatment options, and is a complaint that carries with it a high risk of complications and morbidity.  The differential diagnosis includes CVA, ICH, intracranial mass, critical dehydration, heptatic dysfunction, uremia, hypercarbia, intoxication/withdrawal, endocrine abnormality, sepsis/infection.   Co morbidities that complicate the patient evaluation  anemia, hepatitis A, HIV compliant on medications, HTN    Additional history obtained:  No PCP listed in chart   Problem List / ED Course / Critical interventions / Medication management  Patient presented for decreased sensation and strength in left upper and lower extremities. Rest of physical/neuro exam reassuring. Patient initially mildly tachycardic and HTN.  I Ordered, and personally interpreted labs.  APTT/PT/INR within normal limits.  CBC without leukocytosis or anemia.  CMP with mild hypokalemia 3.3.  CO2 is also mildly low at 20.  EtOH within normal limits.  I personally viewed and interpreted the EKG/cardiac monitored which showed an underlying rhythm of: Sinus tachycardia I ordered imaging studies including CT head.  This imaging study is pending. I have reviewed the patients home medicines and have made adjustments as needed   Social Determinants of Health:  none  3:16 PM Care of Allison Wilson transferred to Dr. Nada at the end of my shift as the patient will require reassessment once labs/imaging have resulted. Patient presentation, ED course, and plan of care discussed with review of all pertinent labs and imaging. Please see his/her note for further details regarding further ED course and disposition. Plan at time of handoff is reassess patient after imaging. She will need MRI brain if CT is non-diagnostic. This may be altered or completely changed at the discretion of the oncoming team pending results of further workup.       Final diagnoses:  None    ED Discharge Orders      None          Hoy Nidia FALCON, [place] 06/16/24 1517    Neysa Caron PARAS, DO 06/16/24 1524

## 2024-06-16 NOTE — ED Notes (Signed)
 Patient transported to CT

## 2024-06-16 NOTE — ED Notes (Signed)
 Provider at bedside

## 2024-06-16 NOTE — ED Triage Notes (Addendum)
 Pt c.o left arm and leg numbness since Friday, denies pain but it is worse when she walks. Pt also had an episode of dizziness Friday while she was at work.  Significant weakness noted on the left side and left sided facial droop.

## 2024-06-16 NOTE — ED Provider Notes (Signed)
 Assumed care of patient at handoff from prior provider.  Please review of their note for detailed HPI and physical examination as well as medical decision making.  43 year old female history of HIV on ARV T who is presenting to the emergency department for left upper and lower extremity sensory deficits and decreased strength since Friday.  On evaluation patient is afebrile and has no signs of meningismus per prior provider.  CT head obtained and pending results.  Previous team did not reach out to neurology as they were waiting on imaging results.  Plan at handoff:  -Follow-up CT head imaging - Order MR brain - Consider neurology consult    Physical Exam  BP (!) 144/85   Pulse 78   Temp 97.6 F (36.4 C) (Oral)   Resp 20   Ht 5' 4 (1.626 m)   Wt 79.4 kg   LMP 06/14/2024 (Exact Date)   SpO2 100%   BMI 30.04 kg/m   Physical Exam Vitals reviewed.  Constitutional:      General: She is not in acute distress. Neurological:     Mental Status: She is alert and oriented to person, place, and time.     GCS: GCS eye subscore is 4. GCS verbal subscore is 5. GCS motor subscore is 6.     Sensory: Sensation is intact.     Motor: Weakness present.     Coordination: Finger-Nose-Finger Test abnormal.     Comments: Left-sided dysmetria present. Left upper extremity: 4/5 strength.  Sensation intact.  2+ radial pulse Left lower extremity: 4/5 strength.  Sensation intact.  2+ DP pulse Pronator drift present of left upper and left lower extremity  Right upper extremity 5/5 strength.  Sensation intact.  2+ radial pulse Right lower extremity: 5/5 strength.  Sensation intact.  2+ DP pulse No pronator drift of right upper or lower extremity     Procedures  Procedures  ED Course / MDM   Clinical Course as of 06/16/24 2322  Sun Jun 16, 2024  1531 S. Hx HIV on ART, no meningismus. CC l ext decreased sensation and strength since Friday. 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 Radiology: ordered. Decision-making details documented in ED Course.  Risk Prescription drug management. Decision regarding hospitalization.   On initial evaluation patient was hemodynamically stable, afebrile and not in acute distress however does have focal neurologic deficits on examination as described above.  CT head reviewed and negative for acute ICH.  MRI brain obtained with evidence of acute infarct.  Neurology consulted with recommendations to give patient Plavix  load.  Patient will be admitted to hospitalist for continued care and management.  Patient was updated on plan of care and agreed and understood and had no further questions at the time of admission    Chroman Nada DO Emergency Medicine PGY2    Nada Chroman, DO 06/16/24 2322    Doretha Folks, MD 06/17/24 [phone]

## 2024-06-16 NOTE — ED Notes (Signed)
 Patient transported to MRI

## 2024-06-16 NOTE — Consult Note (Signed)
 NEUROLOGY CONSULT NOTE   Date of service: June 16, 2024 Patient Name: Allison Wilson MRN:  [id number] DOB:  1981-05-05 Chief Complaint: Left-sided weakness for 2 days Requesting Provider: Franky Redia SAILOR, MD  History of Present Illness  Allison Wilson is a 43 y.o. female with hx of HIV (2007, on Tivicay , [place] , last CD4 count 652 in 01/2024), latent TB (s/p treatment in 2007 for 9 months), pre-diabetes, HTN, HLD who presents with two days of left-sided weakness. Reports on Friday 8/22 around lunch time, felt it came on. She took some tylenol  hoping it would go away.   Denies any history of smoking. Denies history of hypertension (although it appears she is on anti-hypertensives). Denies cardiac, renal dysfunction, or HLD (is on ator 10mg ).   Denies family history of strokes or other neurologic diseases. Denies any family in a wheelchair or with mobility issues.    LKW: 8/22, sometime around lunch Modified rankin score: 2-Slight disability-UNABLE to perform all activities but does not need assistance IV Thrombolysis: No (out of window) EVT: No (out of window) ICH Score: N/A  (At time of presentation, which is 2 days after) NIHSS components Score: Comment  1a Level of Conscious 0[x]  1[]  2[]  3[]      1b LOC Questions 0[x]  1[]  2[]       1c LOC Commands 0[x]  1[]  2[]       2 Best Gaze 0[x]  1[]  2[]       3 Visual 0[x]  1[]  2[]  3[]      4 Facial Palsy 0[]  1[]  2[x]  3[]      5a Motor Arm - left 0[]  1[x]  2[]  3[]  4[]  UN[]    5b Motor Arm - Right 0[x]  1[]  2[]  3[]  4[]  UN[]    6a Motor Leg - Left 0[]  1[x]  2[]  3[]  4[]  UN[]    6b Motor Leg - Right 0[x]  1[]  2[]  3[]  4[]  UN[]    7 Limb Ataxia 0[x]  1[]  2[]  UN[]      8 Sensory 0[x]  1[]  2[]  UN[]      9 Best Language 0[x]  1[]  2[]  3[]      10 Dysarthria 0[x]  1[]  2[]  UN[]      11 Extinct. and Inattention 0[x]  1[]  2[]       TOTAL: 2      ROS  Comprehensive ROS performed and pertinent positives documented in HPI   Past History   Past Medical History:   Diagnosis Date   Anemia    Hepatitis A    HIV (human immunodeficiency virus infection) (HCC)    Hypertension     History reviewed. No pertinent surgical history.  Family History: History reviewed. No pertinent family history.  Social History  reports that she has never smoked. She has never used smokeless tobacco. She reports that she does not drink alcohol and does not use drugs.  No Known Allergies  Medications   Current Facility-Administered Medications:    sodium chloride  flush (NS) 0.9 % injection 3 mL, 3 mL, Intravenous, Once, Young, Travis J, DO  Current Outpatient Medications:    amLODipine  (NORVASC ) 5 MG tablet, Take 5 mg by mouth daily., Disp: , Rfl:    atorvastatin  (LIPITOR) 10 MG tablet, Take 10 mg by mouth daily., Disp: , Rfl:    carvedilol  (COREG ) 6.25 MG tablet, Take 6.25 mg by mouth 2 (two) times daily with a meal., Disp: , Rfl:    emtricitabine -tenofovir  AF (DESCOVY ) 200-25 MG tablet, Take 1 tablet by mouth daily., Disp: , Rfl:    FEROSUL 325 (65 Fe) MG tablet, Take 325 mg by mouth daily., Disp: ,  Rfl:    lisinopril -hydrochlorothiazide (ZESTORETIC ) 20-12.5 MG tablet, Take 1 tablet by mouth daily., Disp: , Rfl:    Vitamin D , Ergocalciferol , (DRISDOL ) 1.25 MG (50000 UNIT) CAPS capsule, Take 50,000 Units by mouth 2 (two) times a week., Disp: , Rfl:    bictegravir-emtricitabine -tenofovir  AF (BIKTARVY ) 50-200-25 MG TABS tablet, Take 1 tablet by mouth daily. (Patient not taking: Reported on 16-Jul-2024), Disp: 30 tablet, Rfl: 11  Vitals   Vitals:   16-Jul-2024 1232 16-Jul-2024 1234 07-16-2024 1701 Jul 16, 2024 2006  BP:  (!) 164/110 (!) 150/93 (!) 157/93  Pulse:  (!) 103 70 72  Resp:  14 (!) 21 14  Temp:  97.9 F (36.6 C) 98.6 F (37 C) 97.6 F (36.4 C)  TempSrc:  Oral  Oral  SpO2:  99% 100% 100%  Weight: 79.4 kg     Height: 5' 4 (1.626 m)       Body mass index is 30.04 kg/m.   Physical Exam   Constitutional: Appears well-developed and well-nourished.  Psych:  Affect appropriate to situation.  Eyes: No scleral injection.  HENT: No OP obstruction.  Head: Normocephalic.  Cardiovascular: Normal rate and regular rhythm.  Respiratory: Effort normal, non-labored breathing.  GI: Soft.  No distension. There is no tenderness.  Skin: WDI.   Neurologic Examination   Mental status: alert, oriented to person, place and time. Able to provide history. Speech: no dysarthria, word-finding difficulty, paraphasic errors. Cranial nerves: PERRL EOMI VF full Face sensation intact bilaterally. Face with left lower facial droop. Hearing grossly intact. Palate elevation symmetric Tongue protrudes midline and has full range of motion. SCM's full strength bilaterally. Motor: Normal bulk and tone. No abnormal movements RUE: shoulder abduction 5/5, biceps 5/5, triceps 5/5, wrist flexion 5/5, wrist extension 5/5, hand grip 5/5 LUE: shoulder abduction 4/5, biceps 4+/5, triceps 5/5, wrist flexion 5/5, wrist extension 5/5, hand grip 5/5 RLE: hip flexion 5/5, knee flexion 5/5, knee extension 5/5, ankle dorsiflexion 5/5, plantar flexion 5/5 LLE: hip flexion 5/5, knee flexion 5/5, knee extension 5/5, ankle dorsiflexion 5/5, plantar flexion 5/5 Sensory: Grossly intact to light touch throughout. Romberg negative. Reflexes: RUE: 2+ biceps, 2+ brachioradialis, 2+ triceps LUE: 2+ brisk throughout RLE: 1+ patellar, 2+ ankle LLE: 3+ patellar (suprapatellar present), 2+ ankle.  Upgoing toe on L, equivocal on R Coordination: FTN intact. HTS intact. Gait: On cursory exam, is wide-based, non-antalgic.  Labs/Imaging/Neurodiagnostic studies   CBC:  Recent Labs  Lab 07-16-2024 1253  WBC 5.0  NEUTROABS 2.7  HGB 13.4  HCT 41.9  MCV 87.1  PLT 248   Basic Metabolic Panel:  Lab Results  Component Value Date   NA 137 2024-07-16   K 3.3 (L) 07-16-24   CO2 20 (L) 07-16-2024   GLUCOSE 129 (H) Jul 16, 2024   BUN 9 2024-07-16   CREATININE 1.05 (H) 16-Jul-2024   CALCIUM   9.4 2024/07/16   GFRNONAA >60 07-16-2024   GFRAA 79 05/12/2020   Lipid Panel:  Lab Results  Component Value Date   LDLCALC 149 (H) 08/08/2023   HgbA1c: No results found for: HGBA1C Urine Drug Screen: No results found for: LABOPIA, COCAINSCRNUR, LABBENZ, AMPHETMU, THCU, LABBARB  Alcohol Level     Component Value Date/Time   [facility] <15 07-16-24 1253   INR  Lab Results  Component Value Date   INR 1.0 Jul 16, 2024   APTT  Lab Results  Component Value Date   APTT 28 16-Jul-2024   AED levels: No results found for: PHENYTOIN, ZONISAMIDE, LAMOTRIGINE, LEVETIRACETA  CT Head without contrast(Personally  reviewed): Hypodensity in R BG.  MRI Brain(Personally reviewed): T2/FLAIR hyperintensity in R BG. DWI & ADC correlates. Additional T2/FLAIR hyperintensity in R cerebellum, medial L midbrain, periventricular (R>L anterior lateral), L parietal subcortical.    ASSESSMENT   Allison Wilson is a 43 y.o. female with PMH of HIV (2007, on Tivicay , [place] ), latent TB (s/p treatment in 2007 for 9 months), pre-diabetes, HTN, HLD who presents with two days of left-sided weakness, found to have R BG infarct. MRI brain shows additional T2/FLAIR changes that are greater than expected for her age. Although she does have risk factors for small vessel disease, recommend further work-up to characterize these lesions due to her young age.   RECOMMENDATIONS  MRI brain w/wo contrast (MS protocol/sagittal T2) & MRA head Will get back to you on whether vessel wall imaging is possible Will defer on spine imaging for now due to neuro exam and no prior history of focal neurologic deficits Stroke DAPT S/p plavix  300mg  load in ED on 8/24 Start plavix  75mg  tomorrow for 20 days, for a total of 21 days of plavix  Start ASA 81mg  To continue for life  Carotid neck vessel imaging to evaluate for stenosis Echocardiogram with bubble study to evaluate for thrombus & PFO OK to start home blood  pressure medications with normal BP goals  Serum studies:  LDL Should be on high-intensity statin (please increase home ator 10 to 40 or 80mg ) ANA, ENA, APLS  OT/PT evaluation ______________________________________________________________________    Signed, Normie CHRISTELLA Blower, MD [facility]

## 2024-06-16 NOTE — H&P (Signed)
 History and Physical    Allison Wilson FMW:[id number] DOB: Nov 22, 1980 DOA: 06/16/2024  Patient coming from: Home.  Chief Complaint: Left-sided numbness and weakness.  HPI: Allison Wilson is a 43 y.o. female with history of HIV, hypertension, hyperlipidemia presents to the ER with complaints of left-sided numbness and weakness over the last 48 hours.  Denies any visual symptoms difficulty speaking or swallowing.  Denies any weakness on the right side.  Since the symptoms persisted patient presents to the ER.  ED Course: In the ER MRI of the brain shows acute right basal ganglia infarct.  On exam patient is weak on the left side.  Neurologist on-call was consulted patient admitted for further workup.  EKG shows sinus tachycardia.  Labs show potassium of 3.3 creatinine 1.05.  Patient admitted for further stroke workup.  Review of Systems: As per HPI, rest all negative.   Past Medical History:  Diagnosis Date   Anemia    Hepatitis A    HIV (human immunodeficiency virus infection) (HCC)    Hypertension     History reviewed. No pertinent surgical history.   reports that she has never smoked. She has never used smokeless tobacco. She reports that she does not drink alcohol and does not use drugs.  No Known Allergies  History reviewed. No pertinent family history.  Prior to Admission medications   Medication Sig Start Date End Date Taking? Authorizing Provider  amLODipine  (NORVASC ) 5 MG tablet Take 5 mg by mouth daily.   Yes [provider]  atorvastatin  (LIPITOR) 10 MG tablet Take 10 mg by mouth daily.   Yes [provider]  carvedilol  (COREG ) 6.25 MG tablet Take 6.25 mg by mouth 2 (two) times daily with a meal. 05/08/22  Yes [provider]  emtricitabine -tenofovir  AF (DESCOVY ) 200-25 MG tablet Take 1 tablet by mouth daily. 05/08/22  Yes [provider]  FEROSUL 325 (65 Fe) MG tablet Take 325 mg by mouth daily.   Yes [provider]   lisinopril -hydrochlorothiazide (ZESTORETIC ) 20-12.5 MG tablet Take 1 tablet by mouth daily. 05/08/22  Yes [provider]  Vitamin D , Ergocalciferol , (DRISDOL ) 1.25 MG (50000 UNIT) CAPS capsule Take 50,000 Units by mouth 2 (two) times a week.   Yes [provider]  bictegravir-emtricitabine -tenofovir  AF (BIKTARVY ) 50-200-25 MG TABS tablet Take 1 tablet by mouth daily. Patient not taking: Reported on 06/16/2024 02/14/24   Dennise Kingsley, MD    Physical Exam: Constitutional: Moderately built and nourished. Vitals:   06/16/24 1234 06/16/24 1701 06/16/24 2006 06/16/24 2030  BP: (!) 164/110 (!) 150/93 (!) 157/93 112/84  Pulse: (!) 103 70 72   Resp: 14 (!) 21 14 18   Temp: 97.9 F (36.6 C) 98.6 F (37 C) 97.6 F (36.4 C)   TempSrc: Oral  Oral   SpO2: 99% 100% 100%   Weight:      Height:       Eyes: Anicteric no pallor. ENMT: No discharge from the ears eyes nose or mouth. Neck: No mass felt.  No neck rigidity. Respiratory: No rhonchi or crepitations. Cardiovascular: S1-S2 heard. Abdomen: Soft nontender bowel sound present. Musculoskeletal: No edema. Skin: No rash. Neurologic: Alert awake oriented to time place and person.  Left upper extremity is 4 x 5.  Left lower extremity is 3 x 5.  Right upper and lower extremity is 5 x 5.  No facial asymmetry tongue is midline pupils equal and reacting to light. Psychiatric: Appears normal.  Normal affect.   Labs on Admission: I have  personally reviewed following labs and imaging studies  CBC: Recent Labs  Lab 06/16/24 1253  WBC 5.0  NEUTROABS 2.7  HGB 13.4  HCT 41.9  MCV 87.1  PLT 248   Basic Metabolic Panel: Recent Labs  Lab 06/16/24 1253  NA 137  K 3.3*  CL 104  CO2 20*  GLUCOSE 129*  BUN 9  CREATININE 1.05*  CALCIUM  9.4   GFR: Estimated Creatinine Clearance: 70.5 mL/min (A) (by C-G formula based on SCr of 1.05 mg/dL (H)). Liver Function Tests: Recent Labs  Lab 06/16/24 1253  AST 21  ALT 13  ALKPHOS  68  BILITOT 0.6  PROT 8.0  ALBUMIN 3.6   No results for input(s): LIPASE, AMYLASE in the last 168 hours. No results for input(s): AMMONIA in the last 168 hours. Coagulation Profile: Recent Labs  Lab 06/16/24 1253  INR 1.0   Cardiac Enzymes: No results for input(s): CKTOTAL, CKMB, CKMBINDEX, TROPONINI in the last 168 hours. BNP (last 3 results) No results for input(s): PROBNP in the last 8760 hours. HbA1C: No results for input(s): HGBA1C in the last 72 hours. CBG: Recent Labs  Lab 06/16/24 1318  GLUCAP 121*   Lipid Profile: No results for input(s): CHOL, HDL, LDLCALC, TRIG, CHOLHDL, LDLDIRECT in the last 72 hours. Thyroid Function Tests: No results for input(s): TSH, T4TOTAL, FREET4, T3FREE, THYROIDAB in the last 72 hours. Anemia Panel: No results for input(s): VITAMINB12, FOLATE, FERRITIN, TIBC, IRON, RETICCTPCT in the last 72 hours. Urine analysis:    Component Value Date/Time   COLORURINE AMBER (A) 12/25/2015 1930   APPEARANCEUR TURBID (A) 12/25/2015 1930   LABSPEC 1.021 12/25/2015 1930   PHURINE 5.0 12/25/2015 1930   GLUCOSEU NEGATIVE 12/25/2015 1930   GLUCOSEU NEG mg/dL 89/73/7990 7958   HGBUR NEGATIVE 12/25/2015 1930   BILIRUBINUR SMALL (A) 12/25/2015 1930   KETONESUR NEGATIVE 12/25/2015 1930   PROTEINUR 30 (A) 12/25/2015 1930   UROBILINOGEN 0.2 02/04/2010 0230   NITRITE NEGATIVE 12/25/2015 1930   LEUKOCYTESUR SMALL (A) 12/25/2015 1930   Sepsis Labs: @LABRCNTIP (procalcitonin:4,lacticidven:4) )No results found for this or any previous visit (from the past 240 hours).   Radiological Exams on Admission: MR BRAIN WO CONTRAST Result Date: 06/16/2024 CLINICAL DATA:  LUE and LLE weakness EXAM: MRI HEAD WITHOUT CONTRAST TECHNIQUE: Multiplanar, multiecho pulse sequences of the brain and surrounding structures were obtained without intravenous contrast. COMPARISON:  same day CTHead. FINDINGS: Brain: Acute perforator  infarct in the right basal ganglia. No substantial mass effect. No evidence of acute hemorrhage, mass lesion, midline shift or hydrocephalus. Vascular: Normal flow voids. Skull and upper cervical spine: Normal marrow signal. Sinuses/Orbits: Negative. Other: No mastoid effusions. IMPRESSION: Acute perforator infarct in the right basal ganglia. No substantial mass effect. Electronically Signed   By: Gilmore GORMAN Molt M.D.   On: 06/16/2024 18:10   CT HEAD WO CONTRAST Result Date: 06/16/2024 CLINICAL DATA:  Acute neurologic deficit. EXAM: CT HEAD WITHOUT CONTRAST TECHNIQUE: Contiguous axial images were obtained from the base of the skull through the vertex without intravenous contrast. RADIATION DOSE REDUCTION: This exam was performed according to the departmental dose-optimization program which includes automated exposure control, adjustment of the mA and/or kV according to patient size and/or use of iterative reconstruction technique. COMPARISON:  None Available. FINDINGS: Brain: No evidence of intracranial hemorrhage, hydrocephalus, extra-axial collection, or mass lesion/mass effect. An ill-defined area decreased attenuation is seen in the right parietal deep periventricular white matter, suspicious for acute or subacute infarct. Vascular:  No hyperdense vessel or other  acute findings. Skull: No evidence of fracture or other significant bone abnormality. Sinuses/Orbits:  No acute findings. Other: None. IMPRESSION: Ill-defined area of decreased attenuation in right parietal deep periventricular white matter, suspicious for acute or subacute infarct. No mass effect. No evidence of intracranial hemorrhage. Electronically Signed   By: Norleen DELENA Kil M.D.   On: 06/16/2024 16:51    EKG: Independently reviewed.  Sinus tachycardia.  Assessment/Plan Principal Problem:   Acute CVA (cerebrovascular accident) [facility]) Active Problems:   Pulmonary tuberculosis   History of Mycobacterium avium intracellulare infection    HIV disease (HCC)   Essential hypertension    Acute CVA -    appreciate neurology consult.  Neurology is planning to get MRI brain with and without contrast.  Check lipid panel hemoglobin A1c neurochecks 2D echo.  Patient did pass stroke swallow screen.  Patient was given Plavix  loading dose 300 mg and to be continued on Plavix  75 mg and 81 mg aspirin .  Neurology recommend increasing the Lipitor to 80 mg.  Also recheck ANA, ENA and APLS.  PT and OT evaluation. Hypertension per neurology okay to continue antihypertensives given the symptoms were present more than 48 hours.  Continue with lisinopril  amlodipine  Coreg .  Holding hydrochlorothiazide for now until potassium corrected also patient receiving fluids. Hypokalemia replace and recheck. Hyperlipidemia on statins.  Dose increased.  Follow lipid panel. HIV on Descovy  last CD4 count was 652 on April 2025.  Since patient has acute CVA will need further workup and more than 2 midnight stay.   DVT prophylaxis: Lovenox . Code Status: Full code. Family Communication: Family at the bedside. Disposition Plan: Monitored bed. Consults called: Neurologist. Admission status: Observation.

## 2024-06-16 NOTE — ED Notes (Signed)
 IV team at bedside

## 2024-06-17 ENCOUNTER — Observation Stay (HOSPITAL_BASED_OUTPATIENT_CLINIC_OR_DEPARTMENT_OTHER)

## 2024-06-17 ENCOUNTER — Observation Stay (HOSPITAL_COMMUNITY)

## 2024-06-17 DIAGNOSIS — E785 Hyperlipidemia, unspecified: Secondary | ICD-10-CM | POA: Diagnosis not present

## 2024-06-17 DIAGNOSIS — I6389 Other cerebral infarction: Secondary | ICD-10-CM | POA: Diagnosis not present

## 2024-06-17 DIAGNOSIS — I639 Cerebral infarction, unspecified: Secondary | ICD-10-CM

## 2024-06-17 DIAGNOSIS — B20 Human immunodeficiency virus [HIV] disease: Secondary | ICD-10-CM | POA: Diagnosis not present

## 2024-06-17 DIAGNOSIS — R29702 NIHSS score 2: Secondary | ICD-10-CM | POA: Diagnosis not present

## 2024-06-17 LAB — CBC
HCT: 38 % (ref 36.0–46.0)
Hemoglobin: 12.5 g/dL (ref 12.0–15.0)
MCH: 28.3 pg (ref 26.0–34.0)
MCHC: 32.9 g/dL (ref 30.0–36.0)
MCV: 86 fL (ref 80.0–100.0)
Platelets: 229 K/uL (ref 150–400)
RBC: 4.42 MIL/uL (ref 3.87–5.11)
RDW: 13.5 % (ref 11.5–15.5)
WBC: 5 K/uL (ref 4.0–10.5)
nRBC: 0 % (ref 0.0–0.2)

## 2024-06-17 LAB — COMPREHENSIVE METABOLIC PANEL WITH GFR
ALT: 13 U/L (ref 0–44)
AST: 17 U/L (ref 15–41)
Albumin: 3.1 g/dL — ABNORMAL LOW (ref 3.5–5.0)
Alkaline Phosphatase: 71 U/L (ref 38–126)
Anion gap: 9 (ref 5–15)
BUN: 9 mg/dL (ref 6–20)
CO2: 23 mmol/L (ref 22–32)
Calcium: 8.9 mg/dL (ref 8.9–10.3)
Chloride: 107 mmol/L (ref 98–111)
Creatinine, Ser: 0.85 mg/dL (ref 0.44–1.00)
GFR, Estimated: >60 mL/min (ref >60)
Glucose, Bld: 98 mg/dL (ref 70–99)
Potassium: 3.3 mmol/L — ABNORMAL LOW (ref 3.5–5.1)
Sodium: 139 mmol/L (ref 135–145)
Total Bilirubin: 0.5 mg/dL (ref 0.0–1.2)
Total Protein: 7.2 g/dL (ref 6.5–8.1)

## 2024-06-17 LAB — LIPID PANEL
Cholesterol: 165 mg/dL (ref 0–200)
HDL: 59 mg/dL (ref >40)
LDL Cholesterol: 95 mg/dL (ref 0–99)
Total CHOL/HDL Ratio: 2.8 ratio
Triglycerides: 53 mg/dL (ref <150)
VLDL: 11 mg/dL (ref 0–40)

## 2024-06-17 LAB — ECHOCARDIOGRAM COMPLETE
Area-P 1/2: 3.37 cm2
Height: 64 in
S' Lateral: 2 cm
Weight: 2931.24 [oz_av]

## 2024-06-17 LAB — HEMOGLOBIN A1C
Hgb A1c MFr Bld: 5.2 % (ref 4.8–5.6)
Mean Plasma Glucose: 102.54 mg/dL

## 2024-06-17 LAB — HCG, SERUM, QUALITATIVE: Preg, Serum: NEGATIVE

## 2024-06-17 MED ORDER — IOHEXOL 350 MG/ML SOLN
75.0000 mL | Freq: Once | INTRAVENOUS | Status: AC | PRN
  Administered 2024-06-17: 75 mL via INTRAVENOUS

## 2024-06-17 MED ORDER — POTASSIUM CHLORIDE 20 MEQ PO PACK
40.0000 meq | PACK | Freq: Once | ORAL | Status: AC
  Administered 2024-06-17: 40 meq via ORAL
  Filled 2024-06-17: qty 2

## 2024-06-17 MED ORDER — BICTEGRAVIR-EMTRICITAB-TENOFOV 50-200-25 MG PO TABS
1.0000 | ORAL_TABLET | Freq: Every day | ORAL | Status: DC
  Administered 2024-06-18: 1 via ORAL
  Filled 2024-06-17: qty 1

## 2024-06-17 MED ORDER — POTASSIUM CHLORIDE CRYS ER 20 MEQ PO TBCR
40.0000 meq | EXTENDED_RELEASE_TABLET | Freq: Once | ORAL | Status: AC
  Administered 2024-06-17: 40 meq via ORAL
  Filled 2024-06-17: qty 2

## 2024-06-17 NOTE — Progress Notes (Signed)
 PROGRESS NOTE    Allison Wilson  FMW:[id number] DOB: 19-Feb-1981 DOA: 06/16/2024 PCP: Pcp, No    Chief Complaint  Patient presents with   Numbness   Extremity Weakness    Brief Narrative:   Allison Wilson is a 43 y.o. female with history of HIV, hypertension, hyperlipidemia presents to the ER with complaints of left-sided numbness and weakness over the last 48 hours.  Denies any visual symptoms difficulty speaking or swallowing.  Denies any weakness on the right side.  Since the symptoms persisted patient presents to the ER.   ED Course: In the ER MRI of the brain shows acute right basal ganglia infarct.  On exam patient is weak on the left side.  Neurologist on-call was consulted patient admitted for further workup.  EKG shows sinus tachycardia.  Labs show potassium of 3.3 creatinine 1.05.  Patient admitted for further stroke workup.  Assessment & Plan:   Principal Problem:   Acute CVA (cerebrovascular accident) (HCC) Active Problems:   History of Mycobacterium avium intracellulare infection   HIV disease (HCC)   Essential hypertension   HLD (hyperlipidemia)   Acute CVA  - Patient with significant left-sided weakness, left facial droop.  - MRI brain: Acute perforator infarct in the right basal ganglia  - Check lipid panel hemoglobin A1c neurochecks 2D echo.   -  Patient was given Plavix  loading dose 300 mg and to be continued on Plavix  75 mg and 81 mg aspirin .   -Neurology recommend increasing the Lipitor to 80 mg.   -Also recheck ANA, ENA and APLS.   - 2D echo is pending, but patient will need TEE as discussed with neurology.   Hypertension - Okay to resume antihypertensive regimen per neurology as symptoms ongoing for> 48 hours  Hypokalemia -replace and recheck.  Hyperlipidemia - on statins.  Dose increased.   HIV  -on Descovy  last CD4 count was 652 on April 2025.     DVT prophylaxis: Lovenox  Code Status: Full Family Communication: (Discussed with patient,  none at bedside) Disposition: Awaiting PT, OT evaluation recommendation     Consultants:  Neurology  Subjective:  She still reports significant weakness in the left side.  Objective: Vitals:   06/17/24 0500 06/17/24 0801 06/17/24 0809 06/17/24 1140  BP: (!) (P) 155/97 (!) 146/135 (!) 147/97 (!) 132/92  Pulse: (P) 61  67   Resp: (P) 18  18   Temp: (P) 97.7 F (36.5 C) 98 F (36.7 C)  97.9 F (36.6 C)  TempSrc: (P) Oral   Oral  SpO2: (P) 99%  99%   Weight: (P) 83.1 kg     Height: (P) 5' 4 (1.626 m)      No intake or output data in the 24 hours ending 06/17/24 1146 Filed Weights   06/16/24 1232 06/17/24 0500  Weight: 79.4 kg (P) 83.1 kg    Examination:  Awake Alert, Oriented X 3, she has left facial droop and left-sided weakness Symmetrical Chest wall movement, Good air movement bilaterally, CTAB RRR,No Gallops,Rubs or new Murmurs, No Parasternal Heave +ve B.Sounds, Abd Soft, No tenderness, No rebound - guarding or rigidity. No Cyanosis, Clubbing or edema, No new Rash or bruise     Data Reviewed: I have personally reviewed following labs and imaging studies  CBC: Recent Labs  Lab 06/16/24 1253 06/17/24 0223  WBC 5.0 5.0  NEUTROABS 2.7  --   HGB 13.4 12.5  HCT 41.9 38.0  MCV 87.1 86.0  PLT 248 229    Basic Metabolic  Panel: Recent Labs  Lab 06/16/24 1253 06/17/24 0223  NA 137 139  K 3.3* 3.3*  CL 104 107  CO2 20* 23  GLUCOSE 129* 98  BUN 9 9  CREATININE 1.05* 0.85  CALCIUM  9.4 8.9    GFR: Estimated Creatinine Clearance: 87 mL/min (by C-G formula based on SCr of 0.85 mg/dL).  Liver Function Tests: Recent Labs  Lab 06/16/24 1253 06/17/24 0223  AST 21 17  ALT 13 13  ALKPHOS 68 71  BILITOT 0.6 0.5  PROT 8.0 7.2  ALBUMIN 3.6 3.1*    CBG: Recent Labs  Lab 06/16/24 1318  GLUCAP 121*     No results found for this or any previous visit (from the past 240 hours).       Radiology Studies: CT ANGIO HEAD NECK W WO CM Result Date:  06/17/2024 CLINICAL DATA:  Stroke/TIA, determine embolic source EXAM: CT ANGIOGRAPHY HEAD AND NECK WITH AND WITHOUT CONTRAST TECHNIQUE: Multidetector CT imaging of the head and neck was performed using the standard protocol during bolus administration of intravenous contrast. Multiplanar CT image reconstructions and MIPs were obtained to evaluate the vascular anatomy. Carotid stenosis measurements (when applicable) are obtained utilizing NASCET criteria, using the distal internal carotid diameter as the denominator. RADIATION DOSE REDUCTION: This exam was performed according to the departmental dose-optimization program which includes automated exposure control, adjustment of the mA and/or kV according to patient size and/or use of iterative reconstruction technique. CONTRAST:  75mL OMNIPAQUE  IOHEXOL  350 MG/ML SOLN COMPARISON:  None Available. FINDINGS: CT HEAD FINDINGS Brain: Similar appearance of a known acute perforator infarct in the right basal ganglia. No significant mass effect. No acute hemorrhage. No hydrocephalus or mass lesion. Vascular: See below. Skull: No acute fracture. Sinuses/Orbits: Clear sinuses.  No acute orbital findings. Review of the MIP images confirms the above findings CTA NECK FINDINGS Aortic arch: Great vessel origins are patent without significant stenosis. Right carotid system: No evidence of dissection, stenosis (50% or greater), or occlusion. Left carotid system: No evidence of dissection, stenosis (50% or greater), or occlusion. Vertebral arteries: Left dominant. No evidence of dissection, stenosis (50% or greater), or occlusion. Skeleton: No acute abnormality on limited assessment. Other neck: No acute abnormality on limited assessment. Upper chest: The lung apices are clear. Review of the MIP images confirms the above findings CTA HEAD FINDINGS Anterior circulation: Hypoplastic left A1 ACA. Otherwise, bilateral intracranial ICAs, MCAs, and ACAs are patent without proximal  hemodynamically significant stenosis. No aneurysm identified. Posterior circulation: The intradural vertebral arteries, basilar artery and bilateral posterior bodies are patent without proximal hemodynamically significant stenosis. Venous sinuses: As permitted by contrast timing, patent. Review of the MIP images confirms the above findings IMPRESSION: No emergent large vessel occlusion or proximal hemodynamically significant stenosis. Electronically Signed   By: Gilmore GORMAN Molt M.D.   On: 06/17/2024 00:45   MR BRAIN WO CONTRAST Result Date: 06/16/2024 CLINICAL DATA:  LUE and LLE weakness EXAM: MRI HEAD WITHOUT CONTRAST TECHNIQUE: Multiplanar, multiecho pulse sequences of the brain and surrounding structures were obtained without intravenous contrast. COMPARISON:  same day CTHead. FINDINGS: Brain: Acute perforator infarct in the right basal ganglia. No substantial mass effect. No evidence of acute hemorrhage, mass lesion, midline shift or hydrocephalus. Vascular: Normal flow voids. Skull and upper cervical spine: Normal marrow signal. Sinuses/Orbits: Negative. Other: No mastoid effusions. IMPRESSION: Acute perforator infarct in the right basal ganglia. No substantial mass effect. Electronically Signed   By: Gilmore GORMAN Molt M.D.   On: 06/16/2024 18:10  CT HEAD WO CONTRAST Result Date: 06/16/2024 CLINICAL DATA:  Acute neurologic deficit. EXAM: CT HEAD WITHOUT CONTRAST TECHNIQUE: Contiguous axial images were obtained from the base of the skull through the vertex without intravenous contrast. RADIATION DOSE REDUCTION: This exam was performed according to the departmental dose-optimization program which includes automated exposure control, adjustment of the mA and/or kV according to patient size and/or use of iterative reconstruction technique. COMPARISON:  None Available. FINDINGS: Brain: No evidence of intracranial hemorrhage, hydrocephalus, extra-axial collection, or mass lesion/mass effect. An ill-defined  area decreased attenuation is seen in the right parietal deep periventricular white matter, suspicious for acute or subacute infarct. Vascular:  No hyperdense vessel or other acute findings. Skull: No evidence of fracture or other significant bone abnormality. Sinuses/Orbits:  No acute findings. Other: None. IMPRESSION: Ill-defined area of decreased attenuation in right parietal deep periventricular white matter, suspicious for acute or subacute infarct. No mass effect. No evidence of intracranial hemorrhage. Electronically Signed   By: Norleen DELENA Kil M.D.   On: 06/16/2024 16:51        Scheduled Meds:  amLODipine   5 mg Oral Daily   aspirin  EC  81 mg Oral Daily   atorvastatin   80 mg Oral Daily   carvedilol   6.25 mg Oral BID WC   clopidogrel   75 mg Oral Daily   emtricitabine -tenofovir  AF  1 tablet Oral Daily   enoxaparin  (LOVENOX ) injection  40 mg Subcutaneous Q24H   ferrous sulfate   325 mg Oral Daily   lisinopril   20 mg Oral Daily   sodium chloride  flush  3 mL Intravenous Once   Continuous Infusions:  sodium chloride  75 mL/hr at 06/16/24 2338     LOS: 0 days     Brayton Lye, MD [facility]   To contact the attending provider between 7A-7P or the covering provider during after hours 7P-7A, please log into the web site [email or web address] and access using universal Scissors password for that web site. If you do not have the password, please call the hospital operator.  06/17/2024, 11:46 AM

## 2024-06-17 NOTE — Progress Notes (Signed)
 PT Cancellation Note  Patient Details Name: Allison Wilson MRN: [id number] DOB: 1981/01/31   Cancelled Treatment:    Reason Eval/Treat Not Completed: Patient at procedure or test/unavailable (Pt meeting with neurology. Will follow up if time allows.)   Aniyiah Zell 06/17/2024, 9:35 AM

## 2024-06-17 NOTE — Progress Notes (Signed)
 SLP Cancellation Note  Patient Details Name: Isadore Bokhari MRN: [id number] DOB: 1980/11/19   Cancelled treatment:       Reason Eval/Treat Not Completed: Patient at procedure or test/unavailable. Procedure just starting in room. Will continue efforts.    Dustin Olam Bull 06/17/2024, 2:03 PM

## 2024-06-17 NOTE — Plan of Care (Signed)
  Problem: Education: Goal: Knowledge of disease or condition will improve 06/17/2024 0618 by Mickiel Cotta, RN Outcome: Progressing 06/17/2024 0617 by Mickiel Cotta, RN Outcome: Progressing Goal: Knowledge of patient specific risk factors will improve (DELETE if not current risk factor) 06/17/2024 0618 by Mickiel Cotta, RN Outcome: Progressing 06/17/2024 0617 by Mickiel Cotta, RN Outcome: Progressing   Problem: Ischemic Stroke/TIA Tissue Perfusion: Goal: Complications of ischemic stroke/TIA will be minimized 06/17/2024 0618 by Mickiel Cotta, RN Outcome: Progressing 06/17/2024 0617 by Mickiel Cotta, RN Outcome: Progressing   Problem: Coping: Goal: Will identify appropriate support needs Outcome: Progressing   Problem: Health Behavior/Discharge Planning: Goal: Ability to manage health-related needs will improve Outcome: Progressing

## 2024-06-17 NOTE — Evaluation (Signed)
 Speech Language Pathology Evaluation Patient Details Name: Allison Wilson MRN: [id number] DOB: 22-Oct-1981 Today's Date: 06/17/2024 Time:  -     Problem List:  Patient Active Problem List   Diagnosis Date Noted   HLD (hyperlipidemia) 06/17/2024   Acute CVA (cerebrovascular accident) (HCC) 06/16/2024   Essential hypertension 06/16/2024   Diarrhea 12/02/2020   Renal insufficiency 10/30/2018   Encounter for long-term (current) use of high-risk medication 04/09/2018   LGSIL on Pap smear of cervix 04/10/2017   HIV disease (HCC) 11/28/2016   Screening examination for venereal disease 12/24/2015   History of Mycobacterium avium intracellulare infection 12/24/2015   Anemia 11/30/2015   Gallbladder disease    Acalculous cholecystitis 11/27/2015   Pulmonary tuberculosis 12/12/2006   Poor dentition 12/12/2006   Past Medical History:  Past Medical History:  Diagnosis Date   Anemia    Hepatitis A    HIV (human immunodeficiency virus infection) (HCC)    Hypertension    Past Surgical History: History reviewed. No pertinent surgical history. HPI:  Pt is a 43 year old woman admitted on 06/16/24 with L side weakness. MRI + for R basal ganglia CVA. PMH:  anemia, Hep A, HIV, HLD.   Assessment / Plan / Recommendation Clinical Impression  Pt, husband and daughter had no prior or current concerns with speech-language-cognition. Her speech is 100% intelligible and language appropriate. Pt given parts of the Cognistat and scored within normal range in all subtests except calculations and reasoning. There were some words she was unfamiliar with as English is her second language. No further ST is needed however therapist recommended that husband observe pt initially when taking her medications- she states she is going to use a pill box. Husband and daughter  voiced understanding.    SLP Assessment  SLP Recommendation/Assessment: Patient does not need any further Speech Language Pathology Services SLP  Visit Diagnosis: Cognitive communication deficit (R41.841)     Assistance Recommended at Discharge     Functional Status Assessment Patient has not had a recent decline in their functional status  Frequency and Duration           SLP Evaluation Cognition  Overall Cognitive Status: Impaired/Different from baseline Arousal/Alertness: Awake/alert Orientation Level: Oriented X4 Year: 2025 Month: August Day of Week: Correct Attention: Sustained Sustained Attention: Appears intact Memory: Appears intact Awareness: Appears intact Problem Solving: Appears intact Safety/Judgment: Appears intact       Comprehension  Auditory Comprehension Overall Auditory Comprehension: Appears within functional limits for tasks assessed Commands: Within Functional Limits (3 step) Visual Recognition/Discrimination Discrimination: Not tested Reading Comprehension Reading Status: Not tested    Expression Expression Primary Mode of Expression: Verbal Verbal Expression Overall Verbal Expression: Appears within functional limits for tasks assessed Initiation: No impairment Level of Generative/Spontaneous Verbalization: Conversation Repetition: No impairment Naming: No impairment Pragmatics: No impairment Written Expression Written Expression: Not tested   Oral / Motor  Oral Motor/Sensory Function Overall Oral Motor/Sensory Function: Mild impairment Facial ROM: Reduced left (minimal) Facial Symmetry: Abnormal symmetry left (minimal) Motor Speech Overall Motor Speech: Appears within functional limits for tasks assessed Respiration: Within functional limits Phonation: Normal Resonance: Within functional limits Articulation: Within functional limitis Intelligibility: Intelligible Motor Planning: Within functional limits Motor Speech Errors: Not applicable            Dustin Olam Bull 06/17/2024, 3:14 PM

## 2024-06-17 NOTE — ED Notes (Signed)
 Patient in CT at this time.

## 2024-06-17 NOTE — Evaluation (Signed)
 Physical Therapy Evaluation Patient Details Name: Allison Wilson MRN: [id number] DOB: January 09, 1981 Today's Date: 06/17/2024  History of Present Illness  Pt is a 43 year old woman admitted on 06/16/24 with L side weakness. MRI + for R basal ganglia CVA. PMH:  anemia, Hep A, HIV, HLD.  Clinical Impression  Pt presents with admitting diagnosis above. Pt today was able to ambulate in hallway with no AD supervision/CGA and navigate 3 steps with CGA. Pt noted with some L sided weakness grossly 4/5 strength with some slight ataxia noted. 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 Recommend OPPT upon DC. PT will continue to follow.           If plan is discharge home, recommend the following: A little help with walking and/or transfers;A little help with bathing/dressing/bathroom;Assistance with cooking/housework;Direct supervision/assist for medications management;Assist for transportation;Help with stairs or ramp for entrance   Can travel by private vehicle        Equipment Recommendations None recommended by PT  Recommendations for Other Services       Functional Status Assessment Patient has had a recent decline in their functional status and demonstrates the ability to make significant improvements in function in a reasonable and predictable amount of time.     Precautions / Restrictions Precautions Precautions: Fall Recall of Precautions/Restrictions: Intact Restrictions Weight Bearing Restrictions Per Provider Order: No      Mobility  Bed Mobility Overal bed mobility: Modified Independent                  Transfers Overall transfer level: Independent Equipment used: None                    Ambulation/Gait Ambulation/Gait assistance: Supervision, Contact guard assist Gait Distance (Feet): 200 Feet Assistive device: Rolling walker (2 wheels), None Gait Pattern/deviations: Ataxic, Drifts right/left, Decreased stride length, Step-through  pattern Gait velocity: decreased     General Gait Details: Some slight ataxia noted on LLE during ambulation. Pt ambulated to bathroom with RW at supervision level and in the hall with no AD CGA. no LOB noted.  Stairs Stairs: Yes Stairs assistance: Contact guard assist Stair Management: Two rails, Alternating pattern, Step to pattern, Forwards Number of Stairs: 3 General stair comments: Limited by IV pole however CGA throughout. No LOB noted. Pt cued for sequencing.  Wheelchair Mobility     Tilt Bed    Modified Rankin (Stroke Patients Only)       Balance Overall balance assessment: Needs assistance   Sitting balance-Leahy Scale: Good       Standing balance-Leahy Scale: Good                               Pertinent Vitals/Pain Pain Assessment Pain Assessment: No/denies pain    Home Living Family/patient expects to be discharged to:: Private residence Living Arrangements: Spouse/significant other;Children Available Help at Discharge: Family;Available 24 hours/day Type of Home: House Home Access: Level entry     Alternate Level Stairs-Number of Steps: 5 Home Layout: Two level Home Equipment: Agricultural consultant (2 wheels);Cane - single point;Grab bars - toilet;Shower seat;Grab bars - tub/shower;Hand held shower head;Wheelchair - manual Additional Comments: All equipment belongs to pts father.    Prior Function Prior Level of Function : Independent/Modified Independent;Driving;Working/employed             Mobility Comments: Ind ADLs Comments: works in Insurance claims handler  Upper Extremity Assessment Upper Extremity Assessment: Right hand dominant;LUE deficits/detail LUE Deficits / Details: strength 4+/5 shoulder, otherwise 5/5 LUE Coordination: decreased fine motor    Lower Extremity Assessment Lower Extremity Assessment: LLE deficits/detail LLE Deficits / Details: L side grossly 4/5    Cervical / Trunk  Assessment Cervical / Trunk Assessment: Normal  Communication   Communication Communication: No apparent difficulties    Cognition Arousal: Alert Behavior During Therapy: WFL for tasks assessed/performed                             Following commands: Intact       Cueing Cueing Techniques: Verbal cues     General Comments General comments (skin integrity, edema, etc.): VSS    Exercises     Assessment/Plan    PT Assessment Patient needs continued PT services  PT Problem List Decreased strength;Decreased range of motion;Decreased activity tolerance;Decreased balance;Decreased mobility;Decreased cognition;Decreased coordination;Decreased knowledge of use of DME;Decreased safety awareness;Decreased knowledge of precautions;Cardiopulmonary status limiting activity       PT Treatment Interventions DME instruction;Gait training;Stair training;Functional mobility training;Therapeutic activities;Therapeutic exercise;Balance training;Neuromuscular re-education;Patient/family education    PT Goals (Current goals can be found in the Care Plan section)  Acute Rehab PT Goals Patient Stated Goal: to go home PT Goal Formulation: With patient Time For Goal Achievement: 07/01/24 Potential to Achieve Goals: Good    Frequency Min 2X/week     Co-evaluation               AM-PAC PT 6 Clicks Mobility  Outcome Measure Help needed turning from your back to your side while in a flat bed without using bedrails?: None Help needed moving from lying on your back to sitting on the side of a flat bed without using bedrails?: None Help needed moving to and from a bed to a chair (including a wheelchair)?: A Little Help needed standing up from a chair using your arms (e.g., wheelchair or bedside chair)?: None Help needed to walk in hospital room?: A Little Help needed climbing 3-5 steps with a railing? : A Little 6 Click Score: 21    End of Session Equipment Utilized During  Treatment: Gait belt Activity Tolerance: Patient tolerated treatment well Patient left: in bed;with call bell/phone within reach;with family/visitor present Nurse Communication: Mobility status PT Visit Diagnosis: Other abnormalities of gait and mobility (R26.89)    Time: 9064-9043 PT Time Calculation (min) (ACUTE ONLY): 21 min   Charges:   PT Evaluation $PT Eval Moderate Complexity: 1 Mod   PT General Charges $$ ACUTE PT VISIT: 1 Visit         Sueellen NOVAK, PT, DPT Acute Rehab Services [phone]   Mohab Ashby 06/17/2024, 12:21 PM

## 2024-06-17 NOTE — Progress Notes (Addendum)
 STROKE TEAM PROGRESS NOTE    INTERIM HISTORY/SUBJECTIVE Patient continues to have mild left facial and leg weakness.  No new changes Patient has remained hemodynamically stable and afebrile overnight.  She will need TCD bubble study and TEE.  OBJECTIVE  CBC    Component Value Date/Time   WBC 5.0 06/17/2024 0223   RBC 4.42 06/17/2024 0223   HGB 12.5 06/17/2024 0223   HCT 38.0 06/17/2024 0223   PLT 229 06/17/2024 0223   MCV 86.0 06/17/2024 0223   MCH 28.3 06/17/2024 0223   MCHC 32.9 06/17/2024 0223   RDW 13.5 06/17/2024 0223   LYMPHSABS 1.7 06/16/2024 1253   MONOABS 0.5 06/16/2024 1253   EOSABS 0.1 06/16/2024 1253   BASOSABS 0.0 06/16/2024 1253    BMET    Component Value Date/Time   NA 139 06/17/2024 0223   K 3.3 (L) 06/17/2024 0223   CL 107 06/17/2024 0223   CO2 23 06/17/2024 0223   GLUCOSE 98 06/17/2024 0223   BUN 9 06/17/2024 0223   CREATININE 0.85 06/17/2024 0223   CREATININE 0.95 02/14/2024 1617   CALCIUM  8.9 06/17/2024 0223   EGFR 76 08/08/2023 1600   GFRNONAA >60 06/17/2024 0223   GFRNONAA 68 05/12/2020 1654    IMAGING past 24 hours CT ANGIO HEAD NECK W WO CM Result Date: 06/17/2024 CLINICAL DATA:  Stroke/TIA, determine embolic source EXAM: CT ANGIOGRAPHY HEAD AND NECK WITH AND WITHOUT CONTRAST TECHNIQUE: Multidetector CT imaging of the head and neck was performed using the standard protocol during bolus administration of intravenous contrast. Multiplanar CT image reconstructions and MIPs were obtained to evaluate the vascular anatomy. Carotid stenosis measurements (when applicable) are obtained utilizing NASCET criteria, using the distal internal carotid diameter as the denominator. RADIATION DOSE REDUCTION: This exam was performed according to the departmental dose-optimization program which includes automated exposure control, adjustment of the mA and/or kV according to patient size and/or use of iterative reconstruction technique. CONTRAST:  75mL OMNIPAQUE   IOHEXOL  350 MG/ML SOLN COMPARISON:  None Available. FINDINGS: CT HEAD FINDINGS Brain: Similar appearance of a known acute perforator infarct in the right basal ganglia. No significant mass effect. No acute hemorrhage. No hydrocephalus or mass lesion. Vascular: See below. Skull: No acute fracture. Sinuses/Orbits: Clear sinuses.  No acute orbital findings. Review of the MIP images confirms the above findings CTA NECK FINDINGS Aortic arch: Great vessel origins are patent without significant stenosis. Right carotid system: No evidence of dissection, stenosis (50% or greater), or occlusion. Left carotid system: No evidence of dissection, stenosis (50% or greater), or occlusion. Vertebral arteries: Left dominant. No evidence of dissection, stenosis (50% or greater), or occlusion. Skeleton: No acute abnormality on limited assessment. Other neck: No acute abnormality on limited assessment. Upper chest: The lung apices are clear. Review of the MIP images confirms the above findings CTA HEAD FINDINGS Anterior circulation: Hypoplastic left A1 ACA. Otherwise, bilateral intracranial ICAs, MCAs, and ACAs are patent without proximal hemodynamically significant stenosis. No aneurysm identified. Posterior circulation: The intradural vertebral arteries, basilar artery and bilateral posterior bodies are patent without proximal hemodynamically significant stenosis. Venous sinuses: As permitted by contrast timing, patent. Review of the MIP images confirms the above findings IMPRESSION: No emergent large vessel occlusion or proximal hemodynamically significant stenosis. Electronically Signed   By: Gilmore GORMAN Molt M.D.   On: 06/17/2024 00:45   MR BRAIN WO CONTRAST Result Date: 06/16/2024 CLINICAL DATA:  LUE and LLE weakness EXAM: MRI HEAD WITHOUT CONTRAST TECHNIQUE: Multiplanar, multiecho pulse sequences of the brain and  surrounding structures were obtained without intravenous contrast. COMPARISON:  same day CTHead. FINDINGS: Brain:  Acute perforator infarct in the right basal ganglia. No substantial mass effect. No evidence of acute hemorrhage, mass lesion, midline shift or hydrocephalus. Vascular: Normal flow voids. Skull and upper cervical spine: Normal marrow signal. Sinuses/Orbits: Negative. Other: No mastoid effusions. IMPRESSION: Acute perforator infarct in the right basal ganglia. No substantial mass effect. Electronically Signed   By: Gilmore GORMAN Molt M.D.   On: 06/16/2024 18:10   CT HEAD WO CONTRAST Result Date: 06/16/2024 CLINICAL DATA:  Acute neurologic deficit. EXAM: CT HEAD WITHOUT CONTRAST TECHNIQUE: Contiguous axial images were obtained from the base of the skull through the vertex without intravenous contrast. RADIATION DOSE REDUCTION: This exam was performed according to the departmental dose-optimization program which includes automated exposure control, adjustment of the mA and/or kV according to patient size and/or use of iterative reconstruction technique. COMPARISON:  None Available. FINDINGS: Brain: No evidence of intracranial hemorrhage, hydrocephalus, extra-axial collection, or mass lesion/mass effect. An ill-defined area decreased attenuation is seen in the right parietal deep periventricular white matter, suspicious for acute or subacute infarct. Vascular:  No hyperdense vessel or other acute findings. Skull: No evidence of fracture or other significant bone abnormality. Sinuses/Orbits:  No acute findings. Other: None. IMPRESSION: Ill-defined area of decreased attenuation in right parietal deep periventricular white matter, suspicious for acute or subacute infarct. No mass effect. No evidence of intracranial hemorrhage. Electronically Signed   By: Norleen DELENA Kil M.D.   On: 06/16/2024 16:51    Vitals:   06/17/24 0500 06/17/24 0801 06/17/24 0809 06/17/24 1140  BP: (!) (P) 155/97 (!) 146/135 (!) 147/97 (!) 132/92  Pulse: (P) 61  67 82  Resp: (P) 18  18 (!) 21  Temp: (P) 97.7 F (36.5 C) 98 F (36.7 C)  97.9  F (36.6 C)  TempSrc: (P) Oral   Oral  SpO2: (P) 99%  99%   Weight: (P) 83.1 kg     Height: (P) 5' 4 (1.626 m)        PHYSICAL EXAM General:  Alert, well-nourished, well-developed patient in no acute distress Psych:  Mood and affect appropriate for situation CV: Regular rate and rhythm on monitor Respiratory:  Regular, unlabored respirations on room air =   NEURO:  Mental Status: AA&Ox3, patient is able to give clear and coherent history Speech/Language: speech is without dysarthria or aphasia.  Naming, repetition, fluency, and comprehension intact.  Cranial Nerves:  II: PERRL. Visual fields full.  III, IV, VI: EOMI. Eyelids elevate symmetrically.  V: Sensation is intact to light touch and symmetrical to face.  VII: Left facial droop VIII: hearing intact to voice. IX, X:Phonation is normal.  XII: tongue is midline without fasciculations. Motor: Able to move all 4 extremities with good antigravity strength, however grip weak on the left, diminished fine finger movements on the left and right arm orbits left.  Some weakness and drift noted in the left leg as well Tone: is normal and bulk is normal Sensation- Intact to light touch bilaterally.  Coordination: FTN intact bilaterally Gait- deferred  Most Recent NIH  1a Level of Conscious.: 0 1b LOC Questions: 0 1c LOC Commands: 0 2 Best Gaze: 0 3 Visual: 0 4 Facial Palsy: 1 5a Motor Arm - left: 0 5b Motor Arm - Right: 0 6a Motor Leg - Left: 1 6b Motor Leg - Right: 0 7 Limb Ataxia: 0 8 Sensory: 0 9 Best Language: 0 10 Dysarthria: 0 11 Extinct.  and Inatten.: 0 TOTAL: 2   ASSESSMENT/PLAN  Allison Wilson is a 43 y.o. female with history of HIV, latent TB status posttreatment, prediabetes, hypertension and hyperlipidemia admitted for 2 days of left-sided weakness.  Patient states that weakness began on Friday while she was at work, and she did not immediately seek help thinking it would go away.  MRI reveals right  basal ganglia infarct.  NIH on Admission 3  Acute Ischemic Infarct:  right basal ganglia infarct  Etiology: Cryptogenic CT head ill-defined area of decreased attenuation in right parietal deep periventricular white matter, suspicious for acute or subacute infarct CTA head & neck no LVO or hemodynamically significant stenosis MRI acute perforator infarct in the right basal ganglia 2D Echo pending TCD bubble study pending TEE pending May consider loop recorder or 30-day cardiac monitor at discharge LDL 95 HgbA1c 5.2 VTE prophylaxis -Lovenox  No antithrombotic prior to admission, now on aspirin  81 mg daily and clopidogrel  75 mg daily for 3 weeks and then aspirin  alone. Therapy recommendations:  Outpatient PT/OT/ST Disposition: Pending, likely home  Hypertension Home meds: Amlodipine  5 mg daily, carvedilol  6.25 mg twice daily, lisinopril -hydrochlorothiazide 20-12.5 mg daily Stable Blood Pressure Goal: BP less than 220/110   Hyperlipidemia Home meds: Atorvastatin  10 mg daily, increased to 80 LDL 95, goal < 70 Continue statin at discharge  Other Stroke Risk Factors Obesity, Body mass index is 31.45 kg/m (pended)., BMI >/= 30 associated with increased stroke risk, recommend weight loss, diet and exercise as appropriate   Other Active Problems HIV infection-continue home Descovy   Hospital day # 0  Patient seen by NP with MD, MD to edit note as needed. Cortney E Everitt Clint Kill , MSN, AGACNP-BC [facility] See Amion for schedule and pager information 06/17/2024 1:08 PM   I have personally obtained history,examined this patient, reviewed notes, independently viewed imaging studies, participated in medical decision making and plan of care.ROS completed by me personally and pertinent positives fully documented  I have made any additions or clarifications directly to the above note. Agree with note above.  Patient continues to have mild left-sided weakness.  Plan check TCD bubble  study for right-to-left shunt and lab work for hypercoagulability and vasculitis.  Continue aspirin  and Plavix  for 3 weeks followed by aspirin  alone and aggressive risk factor modification.  No family at the bedside.   I personally spent a total of 35 minutes in the care of the patient today including getting/reviewing separately obtained history, performing a medically appropriate exam/evaluation, counseling and educating, placing orders, referring and communicating with other health care professionals, documenting clinical information in the EHR, independently interpreting results, and coordinating care.         Eather Popp, MD Medical Director [facility] Pager: [phone] 06/17/2024 5:03 PM   To contact Stroke Continuity provider, please refer to [email or web address]. After hours, contact General Neurology

## 2024-06-17 NOTE — Progress Notes (Signed)
   [facility] has been requested to perform a transesophageal echocardiogram on Allison Wilson for stroke.     The patient does NOT have any absolute or relative contraindications to a Transesophageal Echocardiogram (TEE).  The patient has: No other conditions that may impact this procedure.    After careful review of history and examination, the risks and benefits of transesophageal echocardiogram have been explained including risks of esophageal damage, perforation (1:10,000 risk), bleeding, pharyngeal hematoma as well as other potential complications associated with conscious sedation including aspiration, arrhythmia, respiratory failure and death. Alternatives to treatment were discussed, questions were answered. Patient is willing to proceed.   Signed, Waddell DELENA Donath, PA-C  06/17/2024 4:11 PM

## 2024-06-17 NOTE — Progress Notes (Signed)
 TCD bubble study has been completed.   Results can be found under chart review under CV PROC. 06/17/2024 5:15 PM Tamia Dial RVT, RDMS

## 2024-06-17 NOTE — TOC CAGE-AID Note (Signed)
 Transition of Care [facility]) - CAGE-AID Screening   Patient Details  Name: Allison Wilson MRN: [id number] Date of Birth: 03/16/1981  [facility]) CM/SW Contact:    Letta Cargile E Berthold Glace, LCSW Phone Number: 06/17/2024, 10:20 AM   Clinical Narrative: No SA noted.   CAGE-AID Screening:    Have You Ever Felt You Ought to Cut Down on Your Drinking or Drug Use?: No Have People Annoyed You By Critizing Your Drinking Or Drug Use?: No Have You Felt Bad Or Guilty About Your Drinking Or Drug Use?: No Have You Ever Had a Drink or Used Drugs First Thing In The Morning to Steady Your Nerves or to Get Rid of a Hangover?: No CAGE-AID Score: 0  Substance Abuse Education Offered: No

## 2024-06-17 NOTE — Evaluation (Signed)
 Occupational Therapy Evaluation Patient Details Name: Allison Wilson MRN: [id number] DOB: February 03, 1981 Today's Date: 06/17/2024   History of Present Illness   Pt is a 43 year old woman admitted on 06/16/24 with L side weakness. MRI + for R basal ganglia CVA. PMH:  anemia, Hep A, HIV, HLD.     Clinical Impressions Pt is typically independent, drives and works as a Advertising copywriter. Presents with mild L side weakness, L UE with proximal weakness and distal incoordination. No apparent L UE sensation deficits. Pt needs supervision for lines with OOB mobility, no loss of balance observed. Educated in fine motor home exercise program and provided therapy putty for home use. Instructed in L shoulder theraband exercises, cautioned pt to not hold her breath with exercise or to use theraband when BP high. Pt verbalized understanding. Recommending further OT in OP neuro clinic.      If plan is discharge home, recommend the following:         Functional Status Assessment   Patient has had a recent decline in their functional status and demonstrates the ability to make significant improvements in function in a reasonable and predictable amount of time.     Equipment Recommendations   None recommended by OT     Recommendations for Other Services         Precautions/Restrictions   Precautions Precautions: Fall Recall of Precautions/Restrictions: Intact Restrictions Weight Bearing Restrictions Per Provider Order: No     Mobility Bed Mobility Overal bed mobility: Modified Independent                  Transfers Overall transfer level: Independent Equipment used: None                      Balance Overall balance assessment: Needs assistance   Sitting balance-Leahy Scale: Good       Standing balance-Leahy Scale: Good                             ADL either performed or assessed with clinical judgement   ADL Overall ADL's : Needs  assistance/impaired Eating/Feeding: Independent;Sitting   Grooming: Supervision/safety;Standing   Upper Body Bathing: Supervision/ safety;Standing   Lower Body Bathing: Independent;Sit to/from stand;Supervison/ safety   Upper Body Dressing : Independent;Sitting   Lower Body Dressing: Independent;Sit to/from stand   Toilet Transfer: Supervision/safety   Toileting- Architect and Hygiene: Independent;Sit to/from stand       Functional mobility during ADLs: Supervision/safety       Vision Ability to See in Adequate Light: 0 Adequate Patient Visual Report: No change from baseline       Perception         Praxis         Pertinent Vitals/Pain Pain Assessment Pain Assessment: No/denies pain     Extremity/Trunk Assessment Upper Extremity Assessment Upper Extremity Assessment: Right hand dominant;LUE deficits/detail LUE Deficits / Details: strength 4+/5 shoulder, otherwise 5/5 LUE Coordination: decreased fine motor   Lower Extremity Assessment Lower Extremity Assessment: Defer to PT evaluation   Cervical / Trunk Assessment Cervical / Trunk Assessment: Normal   Communication Communication Communication: No apparent difficulties   Cognition Arousal: Alert Behavior During Therapy: WFL for tasks assessed/performed Cognition: No apparent impairments                               Following commands: Intact  Cueing  General Comments   Cueing Techniques: Verbal cues      Exercises     Shoulder Instructions      Home Living Family/patient expects to be discharged to:: Private residence Living Arrangements: Spouse/significant other;Children Available Help at Discharge: Family;Available 24 hours/day Type of Home: House Home Access: Level entry     Home Layout: Two level Alternate Level Stairs-Number of Steps: 5 Alternate Level Stairs-Rails: Can reach both Bathroom Shower/Tub: Chief Strategy Officer: Standard      Home Equipment: Agricultural consultant (2 wheels);Cane - single point;Grab bars - toilet;Shower seat;Grab bars - tub/shower;Hand held shower head;Wheelchair - manual   Additional Comments: All equipment belongs to pts father.      Prior Functioning/Environment Prior Level of Function : Independent/Modified Independent;Driving;Working/employed             Mobility Comments: Ind ADLs Comments: works in housekeeping    OT Problem List: Decreased strength;Decreased coordination   OT Treatment/Interventions:        OT Goals(Current goals can be found in the care plan section)   Acute Rehab OT Goals OT Goal Formulation: With patient Time For Goal Achievement: 07/01/24 Potential to Achieve Goals: Good   OT Frequency:       Co-evaluation              AM-PAC OT 6 Clicks Daily Activity     Outcome Measure Help from another person eating meals?: None Help from another person taking care of personal grooming?: A Little Help from another person toileting, which includes using toliet, bedpan, or urinal?: A Little Help from another person bathing (including washing, rinsing, drying)?: A Little Help from another person to put on and taking off regular upper body clothing?: None   6 Click Score: 17   End of Session Equipment Utilized During Treatment: Gait belt  Activity Tolerance: Patient tolerated treatment well Patient left: in bed;with call bell/phone within reach  OT Visit Diagnosis: Hemiplegia and hemiparesis Hemiplegia - Right/Left: Left Hemiplegia - dominant/non-dominant: Non-Dominant Hemiplegia - caused by: Cerebral infarction                Time: 8897-8873 OT Time Calculation (min): 24 min Charges:  OT General Charges $OT Visit: 1 Visit OT Evaluation $OT Eval Low Complexity: 1 Low OT Treatments $Therapeutic Exercise: 8-22 mins Mliss HERO, OTR/L Acute Rehabilitation Services Office: [phone]   Kennth Mliss Helling 06/17/2024, 11:35 AM

## 2024-06-17 NOTE — Progress Notes (Signed)
  Echocardiogram 2D Echocardiogram has been performed.  Allison Wilson, RDCS 06/17/2024, 2:29 PM

## 2024-06-18 ENCOUNTER — Encounter (HOSPITAL_COMMUNITY): Payer: Self-pay | Admitting: Internal Medicine

## 2024-06-18 ENCOUNTER — Other Ambulatory Visit: Payer: Self-pay | Admitting: Cardiology

## 2024-06-18 ENCOUNTER — Observation Stay (HOSPITAL_COMMUNITY): Admitting: Certified Registered"

## 2024-06-18 ENCOUNTER — Other Ambulatory Visit (HOSPITAL_COMMUNITY): Payer: Self-pay

## 2024-06-18 ENCOUNTER — Other Ambulatory Visit: Payer: Self-pay

## 2024-06-18 ENCOUNTER — Encounter (HOSPITAL_COMMUNITY)
Admission: EM | Disposition: A | Discharge status: Home / Self Care | Payer: Self-pay | Source: Home / Self Care | Attending: Emergency Medicine

## 2024-06-18 ENCOUNTER — Observation Stay (HOSPITAL_BASED_OUTPATIENT_CLINIC_OR_DEPARTMENT_OTHER)

## 2024-06-18 DIAGNOSIS — E785 Hyperlipidemia, unspecified: Secondary | ICD-10-CM

## 2024-06-18 DIAGNOSIS — I6389 Other cerebral infarction: Secondary | ICD-10-CM | POA: Diagnosis not present

## 2024-06-18 DIAGNOSIS — R29702 NIHSS score 2: Secondary | ICD-10-CM | POA: Diagnosis not present

## 2024-06-18 DIAGNOSIS — I639 Cerebral infarction, unspecified: Secondary | ICD-10-CM

## 2024-06-18 HISTORY — PX: TRANSESOPHAGEAL ECHOCARDIOGRAM (CATH LAB): EP1270

## 2024-06-18 LAB — CBC
HCT: 42 % (ref 36.0–46.0)
Hemoglobin: 13.6 g/dL (ref 12.0–15.0)
MCH: 27.8 pg (ref 26.0–34.0)
MCHC: 32.4 g/dL (ref 30.0–36.0)
MCV: 85.7 fL (ref 80.0–100.0)
Platelets: 214 K/uL (ref 150–400)
RBC: 4.9 MIL/uL (ref 3.87–5.11)
RDW: 13.7 % (ref 11.5–15.5)
WBC: 4.4 K/uL (ref 4.0–10.5)
nRBC: 0 % (ref 0.0–0.2)

## 2024-06-18 LAB — BASIC METABOLIC PANEL WITH GFR
Anion gap: 8 (ref 5–15)
BUN: 11 mg/dL (ref 6–20)
CO2: 24 mmol/L (ref 22–32)
Calcium: 9.5 mg/dL (ref 8.9–10.3)
Chloride: 105 mmol/L (ref 98–111)
Creatinine, Ser: 0.83 mg/dL (ref 0.44–1.00)
GFR, Estimated: >60 mL/min (ref >60)
Glucose, Bld: 79 mg/dL (ref 70–99)
Potassium: 3.8 mmol/L (ref 3.5–5.1)
Sodium: 137 mmol/L (ref 135–145)

## 2024-06-18 LAB — ANA W/REFLEX IF POSITIVE: Anti Nuclear Antibody (ANA): NEGATIVE

## 2024-06-18 LAB — ANTIEXTRACTABLE NUCLEAR AG
ENA SM Ab Ser-aCnc: <0.2 AI (ref 0.0–0.9)
Ribonucleic Protein: 0.2 AI (ref 0.0–0.9)

## 2024-06-18 LAB — GLUCOSE, CAPILLARY: Glucose-Capillary: 90 mg/dL (ref 70–99)

## 2024-06-18 LAB — ECHO TEE

## 2024-06-18 SURGERY — TRANSESOPHAGEAL ECHOCARDIOGRAM (TEE) (CATHLAB)
Anesthesia: Monitor Anesthesia Care

## 2024-06-18 MED ORDER — LABETALOL HCL 5 MG/ML IV SOLN
INTRAVENOUS | Status: DC | PRN
  Administered 2024-06-18: 5 mg via INTRAVENOUS

## 2024-06-18 MED ORDER — ATORVASTATIN CALCIUM 80 MG PO TABS
80.0000 mg | ORAL_TABLET | Freq: Every day | ORAL | 0 refills | Status: AC
  Filled 2024-06-18: qty 30, 30d supply, fill #0

## 2024-06-18 MED ORDER — ASPIRIN 81 MG PO TBEC
81.0000 mg | DELAYED_RELEASE_TABLET | Freq: Every day | ORAL | 0 refills | Status: AC
  Filled 2024-06-18: qty 90, 90d supply, fill #0

## 2024-06-18 MED ORDER — PANTOPRAZOLE SODIUM 40 MG PO TBEC
40.0000 mg | DELAYED_RELEASE_TABLET | Freq: Every day | ORAL | 0 refills | Status: AC
  Filled 2024-06-18: qty 30, 30d supply, fill #0

## 2024-06-18 MED ORDER — CLOPIDOGREL BISULFATE 75 MG PO TABS
75.0000 mg | ORAL_TABLET | Freq: Every day | ORAL | 0 refills | Status: AC
  Filled 2024-06-18: qty 19, 19d supply, fill #0

## 2024-06-18 MED ORDER — LIDOCAINE 2% (20 MG/ML) 5 ML SYRINGE
INTRAMUSCULAR | Status: DC | PRN
  Administered 2024-06-18: 50 mg via INTRAVENOUS

## 2024-06-18 MED ORDER — PROPOFOL 500 MG/50ML IV EMUL
INTRAVENOUS | Status: DC | PRN
Start: 2024-06-18 — End: 2024-06-18
  Administered 2024-06-18: 150 ug/kg/min via INTRAVENOUS

## 2024-06-18 MED ORDER — SODIUM CHLORIDE 0.9 % IV SOLN: INTRAVENOUS | Status: DC

## 2024-06-18 NOTE — Anesthesia Postprocedure Evaluation (Signed)
 Anesthesia Post Note  Patient: Allison Wilson  Procedure(s) Performed: TRANSESOPHAGEAL ECHOCARDIOGRAM     Patient location during evaluation: Cath Lab Anesthesia Type: MAC Level of consciousness: awake Pain management: pain level controlled Vital Signs Assessment: post-procedure vital signs reviewed and stable Respiratory status: spontaneous breathing, nonlabored ventilation and respiratory function stable Cardiovascular status: blood pressure returned to baseline and stable Postop Assessment: no apparent nausea or vomiting Anesthetic complications: no   No notable events documented.  Last Vitals:  Vitals:   06/18/24 1225 06/18/24 1230  BP: (!) 147/102 (!) 155/94  Pulse: 73 74  Resp:    Temp:    SpO2: 98% 95%    Last Pain:  Vitals:   06/18/24 0928  TempSrc: Temporal  PainSc:                  [name] [name]

## 2024-06-18 NOTE — Transfer of Care (Signed)
 Immediate Anesthesia Transfer of Care Note  Patient: Allison Wilson  Procedure(s) Performed: TRANSESOPHAGEAL ECHOCARDIOGRAM  Patient Location: PACU  Anesthesia Type:MAC  Level of Consciousness: awake and alert   Airway & Oxygen Therapy: Patient Spontanous Breathing and Patient connected to nasal cannula oxygen  Post-op Assessment: Report given to RN and Post -op Vital signs reviewed and stable  Post vital signs: Reviewed and stable  Last Vitals:  Vitals Value Taken Time  BP    Temp    Pulse    Resp    SpO2      Last Pain:  Vitals:   06/18/24 0928  TempSrc: Temporal  PainSc:          Complications: No notable events documented.

## 2024-06-18 NOTE — CV Procedure (Signed)
 TRANSESOPHAGEAL ECHOCARDIOGRAM (TEE) NOTE  INDICATIONS: Cryptogenic stroke  PROCEDURE:   Informed consent was obtained prior to the procedure. The risks, benefits and alternatives for the procedure were discussed and the patient comprehended these risks.  Risks include, but are not limited to, cough, sore throat, vomiting, nausea, somnolence, esophageal and stomach trauma or perforation, bleeding, low blood pressure, aspiration, pneumonia, infection, trauma to the teeth and death.    After a procedural time-out, the patient was given propofol  for sedation by anesthesia. See their separate report.  The patient's heart rate, blood pressure, and oxygen saturation are monitored continuously during the procedure.The oropharynx was anesthetized with topical cetacaine.  The transesophageal probe was inserted in the esophagus and stomach without difficulty and multiple views were obtained.  The patient was kept under observation until the patient left the procedure room.  I was present face-to-face 100% of this time. The patient left the procedure room in stable condition.   Agitated microbubble saline contrast was administered twice.  COMPLICATIONS:    There were no immediate complications.  Findings:  LEFT VENTRICLE: The left ventricular wall thickness is notable for mild basal septal hypertrophy.  The left ventricular cavity is normal in size. Wall motion is normal.  LVEF is 60-65%.  RIGHT VENTRICLE:  The right ventricle is normal in structure and function without any thrombus or masses.    LEFT ATRIUM:  The left atrium is normal in size without any thrombus or masses.  There is not spontaneous echo contrast (smoke) in the left atrium consistent with a low flow state.  LEFT ATRIAL APPENDAGE:  The left atrial appendage is free of any thrombus or masses. The appendage has single lobes. Pulse doppler indicates moderate flow in the appendage.  ATRIAL SEPTUM:  The atrial septum appears  intact and is free of thrombus and/or masses.  There is no evidence for interatrial shunting by color doppler and saline microbubble, however, a few late microbubbles were noted in the left atrium, which may be consistent with a small intrapulmonary shunt.  RIGHT ATRIUM:  The right atrium is normal in size and function without any thrombus or masses.  MITRAL VALVE:  The mitral valve is normal in structure and function with no regurgitation.  There were no vegetations or stenosis.  AORTIC VALVE:  The aortic valve is trileaflet, normal in structure and function with no regurgitation.  There were no vegetations or stenosis  TRICUSPID VALVE:  The tricuspid valve is normal in structure and function with no regurgitation.  There were no vegetations or stenosis   PULMONIC VALVE:  The pulmonic valve is normal in structure and function with trivial regurgitation.  There were no vegetations or stenosis.   AORTIC ARCH, ASCENDING AND DESCENDING AORTA:  There was no Shaune et. Al, 1992) atherosclerosis of the ascending aorta, aortic arch, or proximal descending aorta.  12. PULMONARY VEINS: Anomalous pulmonary venous return was not noted.  13. PERICARDIUM: The pericardium appeared normal and non-thickened.  There is no pericardial effusion.  IMPRESSION:   No LAA thrombus Negative for PFO (w/wo valsalva) but possible small intrapulmonary shunt with a few late microbubbles seen in the left atrium Mild basal septal hypertrophy LVEF 60-65%, normal wall motion  RECOMMENDATIONS:     No cardiac source of stroke suspected.  Time Spent Directly with the Patient:  45 minutes   Allison KYM Maxcy, MD, [facility], FNLA, FACP  [facility]  Medical Director of the Advanced Lipid Disorders &  Cardiovascular Risk  Reduction Clinic Diplomate of the American Board of Clinical Lipidology Attending Cardiologist  Direct Dial: [phone]  Fax: [phone]  Website:  [email or web address] Allison Wilson  Allison Wilson 06/18/2024, 11:14 AM

## 2024-06-18 NOTE — Progress Notes (Signed)
 Echocardiogram Echocardiogram Transesophageal has been performed.  Allison Wilson 06/18/2024, 11:26 AM

## 2024-06-18 NOTE — Progress Notes (Signed)
 PT Cancellation Note  Patient Details Name: Allison Wilson MRN: [id number] DOB: Sep 02, 1981   Cancelled Treatment:    Reason Eval/Treat Not Completed: Patient at procedure or test/unavailable (Pt off the floor at TEE. Will follow up later if time allows.)   [name] 06/18/2024, 9:47 AM

## 2024-06-18 NOTE — Discharge Instructions (Signed)
 Follow with Primary MD  in 7 days   Get CBC, CMP, 2 checked  by Primary MD next visit.    Activity: As tolerated with Full fall precautions use walker/cane & assistance as needed   Disposition Home    Diet: Heart Healthy    On your next visit with your primary care physician please Get Medicines reviewed and adjusted.   Please request your Prim.MD to go over all Hospital Tests and Procedure/Radiological results at the follow up, please get all Hospital records sent to your Prim MD by signing hospital release before you go home.   If you experience worsening of your admission symptoms, develop shortness of breath, life threatening emergency, suicidal or homicidal thoughts you must seek medical attention immediately by calling 911 or calling your MD immediately  if symptoms less severe.  You Must read complete instructions/literature along with all the possible adverse reactions/side effects for all the Medicines you take and that have been prescribed to you. Take any new Medicines after you have completely understood and accpet all the possible adverse reactions/side effects.   Do not drive, operating heavy machinery, perform activities at heights, swimming or participation in water activities or provide baby sitting services if your were admitted for syncope or siezures until you have seen by Primary MD or a Neurologist and advised to do so again.  Do not drive when taking Pain medications.    Do not take more than prescribed Pain, Sleep and Anxiety Medications  Special Instructions: If you have smoked or chewed Tobacco  in the last 2 yrs please stop smoking, stop any regular Alcohol  and or any Recreational drug use.  Wear Seat belts while driving.   Please note  You were cared for by a hospitalist during your hospital stay. If you have any questions about your discharge medications or the care you received while you were in the hospital after you are discharged, you can call  the unit and asked to speak with the hospitalist on call if the hospitalist that took care of you is not available. Once you are discharged, your primary care physician will handle any further medical issues. Please note that NO REFILLS for any discharge medications will be authorized once you are discharged, as it is imperative that you return to your primary care physician (or establish a relationship with a primary care physician if you do not have one) for your aftercare needs so that they can reassess your need for medications and monitor your lab values.

## 2024-06-18 NOTE — Progress Notes (Addendum)
 Patient arrived from procedure area to recovery. Patient was responsive but very drowsy. When Dr Mona arrived patient was more awake and complained of not being able to feel her head. Neuro assessment showed increased left sided weakness, left facial droop, difficulty speaking and finding words. She was unable to answer questions. Glucometer check was 90. This lasted approximately 15 minutes then began to resolve. Dr Rosemarie was notified, upon his arrival patient was alert, able to answer questions. Facial droop decreased, right sided weakness improved but she continued to demonstrate left sided weakness.  At 12:30 she was transferred to her room, 5W20. At time of transfer, she had recovered to baseline neuro assessment of pre procedure with some mild left facial droop and lower left extremity weakness.

## 2024-06-18 NOTE — Progress Notes (Signed)
 Ordering 30 day monitor, for stroke. To be read by Dr. Sheena

## 2024-06-18 NOTE — Anesthesia Preprocedure Evaluation (Addendum)
 Anesthesia Evaluation  Patient identified by MRN, date of birth, ID band Patient awake    Reviewed: Allergy & Precautions, NPO status , Patient's Chart, lab work & pertinent test results  Airway Mallampati: III  TM Distance: >3 FB Neck ROM: Full    Dental  (+) Partial Lower   Pulmonary    Pulmonary exam normal        Cardiovascular hypertension, Pt. on medications and Pt. on home beta blockers Normal cardiovascular exam     Neuro/Psych CVA    GI/Hepatic ,,,(+) Hepatitis -  Endo/Other    Renal/GU      Musculoskeletal   Abdominal  (+) + obese  Peds  Hematology  (+) HIV  Anesthesia Other Findings CVA  Reproductive/Obstetrics Hcg negative                               Anesthesia Physical Anesthesia Plan  ASA: 3  Anesthesia Plan: MAC   Post-op Pain Management:    Induction:   PONV Risk Score and Plan: 2 and Propofol  infusion and Treatment may vary due to age or medical condition  Airway Management Planned: Nasal Cannula  Additional Equipment:   Intra-op Plan:   Post-operative Plan:   Informed Consent: I have reviewed the patients History and Physical, chart, labs and discussed the procedure including the risks, benefits and alternatives for the proposed anesthesia with the patient or authorized representative who has indicated his/her understanding and acceptance.     Dental advisory given  Plan Discussed with: CRNA  Anesthesia Plan Comments:          Anesthesia Quick Evaluation

## 2024-06-18 NOTE — Progress Notes (Signed)
 STROKE TEAM PROGRESS NOTE    INTERIM HISTORY/SUBJECTIVE Patient had TEE this morning which showed no cardiac source of embolism, PFO or clot or vegetation.  However postprocedure patient complained of increasing speech difficulties and left-sided weakness.  She was given 5 L normal saline bolus and symptoms resolved gradually.  She appears now back to her baseline.  Continues to have mild left facial and leg weakness.  No new changes    OBJECTIVE  CBC    Component Value Date/Time   WBC 4.4 06/18/2024 0537   RBC 4.90 06/18/2024 0537   HGB 13.6 06/18/2024 0537   HCT 42.0 06/18/2024 0537   PLT 214 06/18/2024 0537   MCV 85.7 06/18/2024 0537   MCH 27.8 06/18/2024 0537   MCHC 32.4 06/18/2024 0537   RDW 13.7 06/18/2024 0537   LYMPHSABS 1.7 06/16/2024 1253   MONOABS 0.5 06/16/2024 1253   EOSABS 0.1 06/16/2024 1253   BASOSABS 0.0 06/16/2024 1253    BMET    Component Value Date/Time   NA 137 06/18/2024 0537   K 3.8 06/18/2024 0537   CL 105 06/18/2024 0537   CO2 24 06/18/2024 0537   GLUCOSE 79 06/18/2024 0537   BUN 11 06/18/2024 0537   CREATININE 0.83 06/18/2024 0537   CREATININE 0.95 02/14/2024 1617   CALCIUM  9.5 06/18/2024 0537   EGFR 76 08/08/2023 1600   GFRNONAA >60 06/18/2024 0537   GFRNONAA 68 05/12/2020 1654    IMAGING past 24 hours ECHO TEE Result Date: 06/18/2024    TRANSESOPHOGEAL ECHO REPORT   Patient Name:   Allison Wilson Date of Exam: 06/18/2024 Medical Rec #:  [id number]       Height:       64.0 in Accession #:    [id number]      Weight:       175.0 lb Date of Birth:  1980/12/04      BSA:          1.848 m Patient Age:    42 years        BP:           165/105 mmHg Patient Gender: F               HR:           106 bpm. Exam Location:  Inpatient Procedure: Transesophageal Echo, Cardiac Doppler, Color Doppler and Saline            Contrast Bubble Study (Both Spectral and Color Flow Doppler were            utilized during procedure). Indications:     Stroke I63.9   History:         Patient has prior history of Echocardiogram examinations, most                  recent 06/17/2024. Stroke; Risk Factors:Hypertension and                  Dyslipidemia.  Sonographer:     Thea Norlander RCS Referring Phys:  [id number] WADDELL A PARCELLS Diagnosing Phys: Vinie Maxcy MD PROCEDURE: After discussion of the risks and benefits of a TEE, an informed consent was obtained from the patient. The transesophogeal probe was passed without difficulty through the esophogus of the patient. Imaged were obtained with the patient in a supine position. Sedation performed by different physician. The patient was monitored while under deep sedation. Anesthestetic sedation was provided intravenously by Anesthesiology: 154.83mg  of Propofol , 50mg  of Lidocaine . Supplementary images  were obtained from transthoracic windows as indicated to answer the clinical question. The patient developed post-procedure confusion, evaluated by Dr. Rosemarie during the procedure.  IMPRESSIONS  1. Left ventricular ejection fraction, by estimation, is 60 to 65%. The left ventricle has normal function. There is mild asymmetric left ventricular hypertrophy of the basal-septal segment.  2. Right ventricular systolic function is normal. The right ventricular size is normal.  3. No left atrial/left atrial appendage thrombus was detected.  4. The mitral valve is normal in structure. No evidence of mitral valve regurgitation.  5. The aortic valve is tricuspid. Aortic valve regurgitation is not visualized. No aortic stenosis is present.  6. Agitated saline contrast bubble study was positive with shunting observed after >6 cardiac cycles suggestive of intrapulmonary shunting. Conclusion(s)/Recommendation(s): No LA/LAA thrombus identified. Negative bubble study for interatrial shunt. No intracardiac source of embolism detected on this on this transesophageal echocardiogram. FINDINGS  Left Ventricle: Left ventricular ejection fraction, by  estimation, is 60 to 65%. The left ventricle has normal function. The left ventricular internal cavity size was normal in size. There is mild asymmetric left ventricular hypertrophy of the basal-septal segment. Right Ventricle: The right ventricular size is normal. No increase in right ventricular wall thickness. Right ventricular systolic function is normal. Left Atrium: Left atrial size was normal in size. No left atrial/left atrial appendage thrombus was detected. Right Atrium: Right atrial size was normal in size. Pericardium: There is no evidence of pericardial effusion. Mitral Valve: The mitral valve is normal in structure. No evidence of mitral valve regurgitation. Tricuspid Valve: The tricuspid valve is normal in structure. Tricuspid valve regurgitation is not demonstrated. Aortic Valve: The aortic valve is tricuspid. Aortic valve regurgitation is not visualized. No aortic stenosis is present. Pulmonic Valve: The pulmonic valve was grossly normal. Pulmonic valve regurgitation is trivial. Aorta: The aortic root and ascending aorta are structurally normal, with no evidence of dilitation. IAS/Shunts: No atrial level shunt detected by color flow Doppler. Agitated saline contrast was given intravenously to evaluate for intracardiac shunting. Agitated saline contrast bubble study was positive with shunting observed after >6 cardiac cycles suggestive of intrapulmonary shunting.  LEFT VENTRICLE PLAX 2D LVOT diam:     1.90 cm LVOT Area:     2.84 cm   AORTA Ao Root diam: 2.90 cm Ao Asc diam:  3.60 cm  SHUNTS Systemic Diam: 1.90 cm Vinie Maxcy MD Electronically signed by Vinie Maxcy MD Signature Date/Time: 06/18/2024/12:28:15 PM    Final    VAS US  TRANSCRANIAL DOPPLER W BUBBLES Result Date: 06/17/2024  Transcranial Doppler with Bubble Patient Name:  Allison Wilson  Date of Exam:   06/17/2024 Medical Rec #: [id number]        Accession #:    [id number] Date of Birth: 1981-09-08       Patient Gender: F Patient Age:    26 years Exam Location:  [facility] Procedure:      VAS US  TRANSCRANIAL DOPPLER W BUBBLES Referring Phys: EARLE DE LA TORRE --------------------------------------------------------------------------------  Indications: Stroke. Comparison Study: No previous exams Performing Technologist: Jody Hill RVT, RDMS  Examination Guidelines: A complete evaluation includes B-mode imaging, spectral Doppler, color Doppler, and power Doppler as needed of all accessible portions of each vessel. Bilateral testing is considered an integral part of a complete examination. Limited examinations for reoccurring indications may be performed as noted.  Summary: No HITS at rest or during Valsalva. Negative transcranial Doppler Bubble study with no evidence of right to left intracardiac communication.  A vascular evaluation was performed. The left PCA was studied. An IV was inserted into the patient's left upper arm. Verbal informed consent was obtained.  Negative TCd Bubble study *See table(s) above for TCD measurements and observations.  Diagnosing physician: Eather Popp MD Electronically signed by Eather Popp MD on 06/17/2024 at 5:21:57 PM.    Final     Vitals:   06/18/24 1215 06/18/24 1220 06/18/24 1225 06/18/24 1230  BP: (!) 156/99 (!) 154/104 (!) 147/102 (!) 155/94  Pulse: 71 75 73 74  Resp:      Temp:      TempSrc:      SpO2: 98% 99% 98% 95%  Weight:      Height:         PHYSICAL EXAM General:  Alert, well-nourished, well-developed patient in no acute distress Psych:  Mood and affect appropriate for situation CV: Regular rate and rhythm on monitor Respiratory:  Regular, unlabored respirations on room air =   NEURO:  Mental Status: AA&Ox3, patient is able to give clear and coherent history Speech/Language: speech is without dysarthria or aphasia.  Naming, repetition, fluency, and comprehension intact.  Cranial Nerves:  II: PERRL. Visual fields full.  III, IV, VI: EOMI. Eyelids elevate  symmetrically.  V: Sensation is intact to light touch and symmetrical to face.  VII: Left facial droop VIII: hearing intact to voice. IX, X:Phonation is normal.  XII: tongue is midline without fasciculations. Motor: Able to move all 4 extremities with good antigravity strength, however grip weak on the left, diminished fine finger movements on the left and right arm orbits left.  Some weakness and drift noted in the left leg as well Tone: is normal and bulk is normal Sensation- Intact to light touch bilaterally.  Coordination: FTN intact bilaterally Gait- deferred  Most Recent NIH  1a Level of Conscious.: 0 1b LOC Questions: 0 1c LOC Commands: 0 2 Best Gaze: 0 3 Visual: 0 4 Facial Palsy: 1 5a Motor Arm - left: 0 5b Motor Arm - Right: 0 6a Motor Leg - Left: 1 6b Motor Leg - Right: 0 7 Limb Ataxia: 0 8 Sensory: 0 9 Best Language: 0 10 Dysarthria: 0 11 Extinct. and Inatten.: 0 TOTAL: 2   ASSESSMENT/PLAN  Ms. Allison Wilson is a 43 y.o. female with history of HIV, latent TB status posttreatment, prediabetes, hypertension and hyperlipidemia admitted for 2 days of left-sided weakness.  Patient states that weakness began on Friday while she was at work, and she did not immediately seek help thinking it would go away.  MRI reveals right basal ganglia infarct.  NIH on Admission 3  Acute Ischemic Infarct:  right basal ganglia infarct  Etiology: Cryptogenic CT head ill-defined area of decreased attenuation in right parietal deep periventricular white matter, suspicious for acute or subacute infarct CTA head & neck no LVO or hemodynamically significant stenosis MRI acute perforator infarct in the right basal ganglia 2D Echo EF.  Left atrial size normal. TCD bubble study negative for right-to-left shunt  TEE negative for PFO or clot or vegetation May consider loop recorder or 30-day cardiac monitor at discharge LDL 95 HgbA1c 5.2 VTE prophylaxis -Lovenox  No antithrombotic prior to  admission, now on aspirin  81 mg daily and clopidogrel  75 mg daily for 3 weeks and then aspirin  alone. Therapy recommendations:  Outpatient PT/OT/ST Disposition: Pending, likely home  Hypertension Home meds: Amlodipine  5 mg daily, carvedilol  6.25 mg twice daily, lisinopril -hydrochlorothiazide 20-12.5 mg daily Stable Blood Pressure Goal: BP less than 220/110  Hyperlipidemia Home meds: Atorvastatin  10 mg daily, increased to 80 LDL 95, goal < 70 Continue statin at discharge  Other Stroke Risk Factors Obesity, Body mass index is 31.45 kg/m (pended)., BMI >/= 30 associated with increased stroke risk, recommend weight loss, diet and exercise as appropriate   Other Active Problems HIV infection-continue home Descovy   Hospital day # 0    Patient continues to have mild left-sided weakness.  Plan c  Continue aspirin  and Plavix  for 3 weeks followed by aspirin  alone and aggressive risk factor modification.  No family at the bedside.  30-day heart monitor at discharge for paroxysmal A-fib.  Follow-up as an outpatient stroke clinic in 2 months.   I personally spent a total of 35 minutes in the care of the patient today including getting/reviewing separately obtained history, performing a medically appropriate exam/evaluation, counseling and educating, placing orders, referring and communicating with other health care professionals, documenting clinical information in the EHR, independently interpreting results, and coordinating care.         Eather Popp, MD Medical Director [facility] Pager: [phone] 06/18/2024 2:59 PM   To contact Stroke Continuity provider, please refer to [email or web address]. After hours, contact General Neurology

## 2024-06-18 NOTE — Discharge Summary (Signed)
 Physician Discharge Summary  Allison Wilson FMW:[id number] DOB: [birth year] DOA: 06/16/2024  PCP: Pcp, No  Admit date: 06/16/2024 Discharge date: 06/18/2024  Admitted From: (Home) Disposition:  (Home)  Recommendations for Outpatient Follow-up:  Please obtain BMP/CBC in one week Follow-up on hypercoagulable workup Neurology to arrange for 30 days cardiac monitor  Diet recommendation: Heart Healthy   Brief/Interim Summary:  Allison Wilson is a 43 y.o. female with history of HIV, hypertension, hyperlipidemia presents to the ER with complaints of left-sided numbness and weakness over the last 48 hours.  Denies any visual symptoms difficulty speaking or swallowing.  Denies any weakness on the right side.  Since the symptoms persisted patient presents to the ER.MRI of the brain shows acute right basal ganglia infarct.  On exam patient is weak on the left side.  Neurologist on-call was consulted patient admitted for further workup.  EKG shows sinus tachycardia.  Labs show potassium of 3.3 creatinine 1.05.  Patient admitted for further stroke workup.   Acute CVA  Etiology likely cryptogenic - Patient with significant left-sided weakness, left facial droop.  - MRI brain: Acute perforator infarct in the right basal ganglia    -  Patient was given Plavix  loading dose 300 mg and to be continued on Plavix  75 mg and 81 mg aspirin .  Commendation for dual antiplatelet therapy, aspirin  and Plavix  for 21 days then aspirin  alone -Neurology recommend increasing the Lipitor to 80 mg.   -Also recheck ANA, ENA and APLS.   - 2D echo with no evidence of thrombus, preserved EF, so she went for TEE 8/26 with no evidence of the left atrium thrombus -Outpatient PT referral has been made -Negative TCD bubble study -Patient had her TEE procedure earlier today, postprocedure he had an episode of worsening transient weakness, felt to be more due to anesthesia upon evaluation by neurology, currently she denies any  complaints.   Hypertension - Okay to resume antihypertensive regimen per neurology as symptoms ongoing for> 48 hours   Hypokalemia -replaced   Hyperlipidemia - on statins.  LDL is 95, atorvastatin  increased from 10 mg daily to 80 mg daily.   HIV  -on Biktarvy , last CD4 count was 652 on April 2025.      Discharge Diagnoses:  Principal Problem:   Acute CVA (cerebrovascular accident) (HCC) Active Problems:   History of Mycobacterium avium intracellulare infection   HIV disease (HCC)   Essential hypertension   HLD (hyperlipidemia)    Discharge Instructions  Discharge Instructions     Ambulatory referral to Neurology   Complete by: As directed    An appointment is requested in approximately: 4 weeks   Diet - low sodium heart healthy   Complete by: As directed    Discharge instructions   Complete by: As directed    Follow with Primary MD  in 7 days   Get CBC, CMP, 2 checked  by Primary MD next visit.    Activity: As tolerated with Full fall precautions use walker/cane & assistance as needed   Disposition Home    Diet: Heart Healthy    On your next visit with your primary care physician please Get Medicines reviewed and adjusted.   Please request your Prim.MD to go over all Hospital Tests and Procedure/Radiological results at the follow up, please get all Hospital records sent to your Prim MD by signing hospital release before you go home.   If you experience worsening of your admission symptoms, develop shortness of breath, life threatening emergency, suicidal or homicidal  thoughts you must seek medical attention immediately by calling 911 or calling your MD immediately  if symptoms less severe.  You Must read complete instructions/literature along with all the possible adverse reactions/side effects for all the Medicines you take and that have been prescribed to you. Take any new Medicines after you have completely understood and accpet all the possible adverse  reactions/side effects.   Do not drive, operating heavy machinery, perform activities at heights, swimming or participation in water activities or provide baby sitting services if your were admitted for syncope or siezures until you have seen by Primary MD or a Neurologist and advised to do so again.  Do not drive when taking Pain medications.    Do not take more than prescribed Pain, Sleep and Anxiety Medications  Special Instructions: If you have smoked or chewed Tobacco  in the last 2 yrs please stop smoking, stop any regular Alcohol  and or any Recreational drug use.  Wear Seat belts while driving.   Please note  You were cared for by a hospitalist during your hospital stay. If you have any questions about your discharge medications or the care you received while you were in the hospital after you are discharged, you can call the unit and asked to speak with the hospitalist on call if the hospitalist that took care of you is not available. Once you are discharged, your primary care physician will handle any further medical issues. Please note that NO REFILLS for any discharge medications will be authorized once you are discharged, as it is imperative that you return to your primary care physician (or establish a relationship with a primary care physician if you do not have one) for your aftercare needs so that they can reassess your need for medications and monitor your lab values.   Increase activity slowly   Complete by: As directed       Allergies as of 06/18/2024   No Known Allergies      Medication List     TAKE these medications    amLODipine  5 MG tablet Commonly known as: NORVASC  Take 5 mg by mouth daily.   aspirin  EC 81 MG tablet Take 1 tablet (81 mg total) by mouth daily. Swallow whole. Start taking on: June 19, 2024   atorvastatin  80 MG tablet Commonly known as: LIPITOR Take 1 tablet (80 mg total) by mouth daily. Start taking on: June 19, 2024 What  changed:  medication strength how much to take   Biktarvy  50-200-25 MG Tabs tablet Generic drug: bictegravir-emtricitabine -tenofovir  AF Take 1 tablet by mouth daily.   carvedilol  6.25 MG tablet Commonly known as: COREG  Take 6.25 mg by mouth 2 (two) times daily with a meal.   clopidogrel  75 MG tablet Commonly known as: PLAVIX  Take 1 tablet (75 mg total) by mouth daily for 19 days. Start taking on: June 19, 2024   FeroSul 325 (65 Fe) MG tablet Generic drug: ferrous sulfate  Take 325 mg by mouth daily.   lisinopril -hydrochlorothiazide 20-12.5 MG tablet Commonly known as: ZESTORETIC  Take 1 tablet by mouth daily.   pantoprazole  40 MG tablet Commonly known as: Protonix  Take 1 tablet (40 mg total) by mouth daily.   Vitamin D  (Ergocalciferol ) 1.25 MG (50000 UNIT) Caps capsule Commonly known as: DRISDOL  Take 50,000 Units by mouth 2 (two) times a week.        No Known Allergies  Consultations: Neurology  Procedures/Studies: ECHO TEE Result Date: 06/18/2024    TRANSESOPHOGEAL ECHO REPORT   Patient  Name:   Allison Wilson [facility] Date of Exam: 06/18/2024 Medical Rec #:  [id number]       Height:       64.0 in Accession #:    [id number]      Weight:       175.0 lb Date of Birth:  [birth year]      BSA:          1.848 m Patient Age:    42 years        BP:           165/105 mmHg Patient Gender: F               HR:           106 bpm. Exam Location:  Inpatient Procedure: Transesophageal Echo, Cardiac Doppler, Color Doppler and Saline            Contrast Bubble Study (Both Spectral and Color Flow Doppler were            utilized during procedure). Indications:     Stroke I63.9  History:         Patient has prior history of Echocardiogram examinations, most                  recent 06/17/2024. Stroke; Risk Factors:Hypertension and                  Dyslipidemia.  Sonographer:     Thea Norlander RCS Referring Phys:  [id number] WADDELL A PARCELLS Diagnosing Phys: Vinie Maxcy MD PROCEDURE: After discussion  of the risks and benefits of a TEE, an informed consent was obtained from the patient. The transesophogeal probe was passed without difficulty through the esophogus of the patient. Imaged were obtained with the patient in a supine position. Sedation performed by different physician. The patient was monitored while under deep sedation. Anesthestetic sedation was provided intravenously by Anesthesiology: 154.83mg  of Propofol , 50mg  of Lidocaine . Supplementary images were obtained from transthoracic windows as indicated to answer the clinical question. The patient developed post-procedure confusion, evaluated by Dr. Rosemarie during the procedure.  IMPRESSIONS  1. Left ventricular ejection fraction, by estimation, is 60 to 65%. The left ventricle has normal function. There is mild asymmetric left ventricular hypertrophy of the basal-septal segment.  2. Right ventricular systolic function is normal. The right ventricular size is normal.  3. No left atrial/left atrial appendage thrombus was detected.  4. The mitral valve is normal in structure. No evidence of mitral valve regurgitation.  5. The aortic valve is tricuspid. Aortic valve regurgitation is not visualized. No aortic stenosis is present.  6. Agitated saline contrast bubble study was positive with shunting observed after >6 cardiac cycles suggestive of intrapulmonary shunting. Conclusion(s)/Recommendation(s): No LA/LAA thrombus identified. Negative bubble study for interatrial shunt. No intracardiac source of embolism detected on this on this transesophageal echocardiogram. FINDINGS  Left Ventricle: Left ventricular ejection fraction, by estimation, is 60 to 65%. The left ventricle has normal function. The left ventricular internal cavity size was normal in size. There is mild asymmetric left ventricular hypertrophy of the basal-septal segment. Right Ventricle: The right ventricular size is normal. No increase in right ventricular wall thickness. Right ventricular  systolic function is normal. Left Atrium: Left atrial size was normal in size. No left atrial/left atrial appendage thrombus was detected. Right Atrium: Right atrial size was normal in size. Pericardium: There is no evidence of pericardial effusion. Mitral Valve: The mitral valve is normal in structure. No evidence of mitral valve  regurgitation. Tricuspid Valve: The tricuspid valve is normal in structure. Tricuspid valve regurgitation is not demonstrated. Aortic Valve: The aortic valve is tricuspid. Aortic valve regurgitation is not visualized. No aortic stenosis is present. Pulmonic Valve: The pulmonic valve was grossly normal. Pulmonic valve regurgitation is trivial. Aorta: The aortic root and ascending aorta are structurally normal, with no evidence of dilitation. IAS/Shunts: No atrial level shunt detected by color flow Doppler. Agitated saline contrast was given intravenously to evaluate for intracardiac shunting. Agitated saline contrast bubble study was positive with shunting observed after >6 cardiac cycles suggestive of intrapulmonary shunting.  LEFT VENTRICLE PLAX 2D LVOT diam:     1.90 cm LVOT Area:     2.84 cm   AORTA Ao Root diam: 2.90 cm Ao Asc diam:  3.60 cm  SHUNTS Systemic Diam: 1.90 cm Vinie Maxcy MD Electronically signed by Vinie Maxcy MD Signature Date/Time: 06/18/2024/12:28:15 PM    Final    VAS US  TRANSCRANIAL DOPPLER W BUBBLES Result Date: 06/17/2024  Transcranial Doppler with Bubble Patient Name:  GLENISHA GUNDRY  Date of Exam:   06/17/2024 Medical Rec #: [id number]        Accession #:    [id number] Date of Birth: Apr 02, 1981       Patient Gender: F Patient Age:   32 years Exam Location:  [facility] Procedure:      VAS US  TRANSCRANIAL DOPPLER W BUBBLES Referring Phys: EARLE DE LA TORRE --------------------------------------------------------------------------------  Indications: Stroke. Comparison Study: No previous exams Performing Technologist: Jody Hill RVT, RDMS  Examination  Guidelines: A complete evaluation includes B-mode imaging, spectral Doppler, color Doppler, and power Doppler as needed of all accessible portions of each vessel. Bilateral testing is considered an integral part of a complete examination. Limited examinations for reoccurring indications may be performed as noted.  Summary: No HITS at rest or during Valsalva. Negative transcranial Doppler Bubble study with no evidence of right to left intracardiac communication.  A vascular evaluation was performed. The left PCA was studied. An IV was inserted into the patient's left upper arm. Verbal informed consent was obtained.  Negative TCd Bubble study *See table(s) above for TCD measurements and observations.  Diagnosing physician: Eather Popp MD Electronically signed by Eather Popp MD on 06/17/2024 at 5:21:57 PM.    Final    ECHOCARDIOGRAM COMPLETE Result Date: 06/17/2024    ECHOCARDIOGRAM REPORT   Patient Name:   Allison Wilson Date of Exam: 06/17/2024 Medical Rec #:  [id number]       Height:       64.0 in Accession #:    [id number]      Weight:       175.0 lb Date of Birth:  06-01-1981      BSA:          1.848 m Patient Age:    42 years        BP:           135/109 mmHg Patient Gender: F               HR:           79 bpm. Exam Location:  Inpatient Procedure: 2D Echo, Cardiac Doppler and Color Doppler (Both Spectral and Color            Flow Doppler were utilized during procedure). Indications:    Stroke I63.9  History:        Patient has no prior history of Echocardiogram examinations.  CVA; Risk Factors:Hypertension and Dyslipidemia.  Sonographer:    Koleen Popper RDCS Referring Phys: [id number] ARSHAD N KAKRAKANDY IMPRESSIONS  1. Left ventricular ejection fraction, by estimation, is 60 to 65%. The left ventricle has normal function. The left ventricle has no regional wall motion abnormalities. There is mild asymmetric left ventricular hypertrophy of the septal segment. Left ventricular diastolic parameters  are indeterminate.  2. Right ventricular systolic function is normal. The right ventricular size is normal. There is normal pulmonary artery systolic pressure. The estimated right ventricular systolic pressure is 10.1 mmHg.  3. The mitral valve is normal in structure. No evidence of mitral valve regurgitation. No evidence of mitral stenosis.  4. The aortic valve is normal in structure. Aortic valve regurgitation is not visualized. No aortic stenosis is present.  5. The inferior vena cava is normal in size with greater than 50% respiratory variability, suggesting right atrial pressure of 3 mmHg. Conclusion(s)/Recommendation(s): No intracardiac source of embolism detected on this transthoracic study. Consider a transesophageal echocardiogram to exclude cardiac source of embolism if clinically indicated. FINDINGS  Left Ventricle: Left ventricular ejection fraction, by estimation, is 60 to 65%. The left ventricle has normal function. The left ventricle has no regional wall motion abnormalities. The left ventricular internal cavity size was normal in size. There is  mild asymmetric left ventricular hypertrophy of the septal segment. Left ventricular diastolic parameters are indeterminate. Normal left ventricular filling pressure. Right Ventricle: The right ventricular size is normal. No increase in right ventricular wall thickness. Right ventricular systolic function is normal. There is normal pulmonary artery systolic pressure. The tricuspid regurgitant velocity is 1.33 m/s, and  with an assumed right atrial pressure of 3 mmHg, the estimated right ventricular systolic pressure is 10.1 mmHg. Left Atrium: Left atrial size was normal in size. Right Atrium: Right atrial size was normal in size. Pericardium: There is no evidence of pericardial effusion. Mitral Valve: The mitral valve is normal in structure. No evidence of mitral valve regurgitation. No evidence of mitral valve stenosis. Tricuspid Valve: The tricuspid valve  is normal in structure. Tricuspid valve regurgitation is trivial. No evidence of tricuspid stenosis. Aortic Valve: The aortic valve is normal in structure. Aortic valve regurgitation is not visualized. No aortic stenosis is present. Pulmonic Valve: The pulmonic valve was normal in structure. Pulmonic valve regurgitation is trivial. No evidence of pulmonic stenosis. Aorta: The aortic root is normal in size and structure. Venous: The inferior vena cava is normal in size with greater than 50% respiratory variability, suggesting right atrial pressure of 3 mmHg. IAS/Shunts: No atrial level shunt detected by color flow Doppler.  LEFT VENTRICLE PLAX 2D LVIDd:         3.50 cm   Diastology LVIDs:         2.00 cm   LV e' medial:    6.74 cm/s LV PW:         1.10 cm   LV E/e' medial:  10.4 LV IVS:        1.40 cm   LV e' lateral:   10.70 cm/s LVOT diam:     1.90 cm   LV E/e' lateral: 6.6 LV SV:         81 LV SV Index:   44 LVOT Area:     2.84 cm  RIGHT VENTRICLE             IVC RV S prime:     14.60 cm/s  IVC diam: 1.30 cm TAPSE (M-mode): 2.3 cm LEFT  ATRIUM           Index LA diam:      2.40 cm 1.30 cm/m LA Vol (A4C): 22.5 ml 12.17 ml/m  AORTIC VALVE LVOT Vmax:   144.00 cm/s LVOT Vmean:  98.500 cm/s LVOT VTI:    0.286 m  AORTA Ao Root diam: 2.70 cm Ao Asc diam:  3.10 cm MITRAL VALVE               TRICUSPID VALVE MV Area (PHT): 3.37 cm    TR Peak grad:   7.1 mmHg MV Decel Time: 225 msec    TR Vmax:        133.00 cm/s MV E velocity: 70.20 cm/s MV A velocity: 72.80 cm/s  SHUNTS MV E/A ratio:  0.96        Systemic VTI:  0.29 m                            Systemic Diam: 1.90 cm Wilbert Bihari MD Electronically signed by Wilbert Bihari MD Signature Date/Time: 06/17/2024/3:56:07 PM    Final    CT ANGIO HEAD NECK W WO CM Result Date: 06/17/2024 CLINICAL DATA:  Stroke/TIA, determine embolic source EXAM: CT ANGIOGRAPHY HEAD AND NECK WITH AND WITHOUT CONTRAST TECHNIQUE: Multidetector CT imaging of the head and neck was performed using the  standard protocol during bolus administration of intravenous contrast. Multiplanar CT image reconstructions and MIPs were obtained to evaluate the vascular anatomy. Carotid stenosis measurements (when applicable) are obtained utilizing NASCET criteria, using the distal internal carotid diameter as the denominator. RADIATION DOSE REDUCTION: This exam was performed according to the departmental dose-optimization program which includes automated exposure control, adjustment of the mA and/or kV according to patient size and/or use of iterative reconstruction technique. CONTRAST:  75mL OMNIPAQUE  IOHEXOL  350 MG/ML SOLN COMPARISON:  None Available. FINDINGS: CT HEAD FINDINGS Brain: Similar appearance of a known acute perforator infarct in the right basal ganglia. No significant mass effect. No acute hemorrhage. No hydrocephalus or mass lesion. Vascular: See below. Skull: No acute fracture. Sinuses/Orbits: Clear sinuses.  No acute orbital findings. Review of the MIP images confirms the above findings CTA NECK FINDINGS Aortic arch: Great vessel origins are patent without significant stenosis. Right carotid system: No evidence of dissection, stenosis (50% or greater), or occlusion. Left carotid system: No evidence of dissection, stenosis (50% or greater), or occlusion. Vertebral arteries: Left dominant. No evidence of dissection, stenosis (50% or greater), or occlusion. Skeleton: No acute abnormality on limited assessment. Other neck: No acute abnormality on limited assessment. Upper chest: The lung apices are clear. Review of the MIP images confirms the above findings CTA HEAD FINDINGS Anterior circulation: Hypoplastic left A1 ACA. Otherwise, bilateral intracranial ICAs, MCAs, and ACAs are patent without proximal hemodynamically significant stenosis. No aneurysm identified. Posterior circulation: The intradural vertebral arteries, basilar artery and bilateral posterior bodies are patent without proximal hemodynamically  significant stenosis. Venous sinuses: As permitted by contrast timing, patent. Review of the MIP images confirms the above findings IMPRESSION: No emergent large vessel occlusion or proximal hemodynamically significant stenosis. Electronically Signed   By: Gilmore GORMAN Molt M.D.   On: 06/17/2024 00:45   MR BRAIN WO CONTRAST Result Date: 06/16/2024 CLINICAL DATA:  LUE and LLE weakness EXAM: MRI HEAD WITHOUT CONTRAST TECHNIQUE: Multiplanar, multiecho pulse sequences of the brain and surrounding structures were obtained without intravenous contrast. COMPARISON:  same day CTHead. FINDINGS: Brain: Acute perforator infarct in the right basal  ganglia. No substantial mass effect. No evidence of acute hemorrhage, mass lesion, midline shift or hydrocephalus. Vascular: Normal flow voids. Skull and upper cervical spine: Normal marrow signal. Sinuses/Orbits: Negative. Other: No mastoid effusions. IMPRESSION: Acute perforator infarct in the right basal ganglia. No substantial mass effect. Electronically Signed   By: Gilmore GORMAN Molt M.D.   On: 06/16/2024 18:10   CT HEAD WO CONTRAST Result Date: 06/16/2024 CLINICAL DATA:  Acute neurologic deficit. EXAM: CT HEAD WITHOUT CONTRAST TECHNIQUE: Contiguous axial images were obtained from the base of the skull through the vertex without intravenous contrast. RADIATION DOSE REDUCTION: This exam was performed according to the departmental dose-optimization program which includes automated exposure control, adjustment of the mA and/or kV according to patient size and/or use of iterative reconstruction technique. COMPARISON:  None Available. FINDINGS: Brain: No evidence of intracranial hemorrhage, hydrocephalus, extra-axial collection, or mass lesion/mass effect. An ill-defined area decreased attenuation is seen in the right parietal deep periventricular white matter, suspicious for acute or subacute infarct. Vascular:  No hyperdense vessel or other acute findings. Skull: No evidence  of fracture or other significant bone abnormality. Sinuses/Orbits:  No acute findings. Other: None. IMPRESSION: Ill-defined area of decreased attenuation in right parietal deep periventricular white matter, suspicious for acute or subacute infarct. No mass effect. No evidence of intracranial hemorrhage. Electronically Signed   By: Norleen DELENA Kil M.D.   On: 06/16/2024 16:51   (Echo, Carotid, EGD, Colonoscopy, ERCP)    Subjective: Patient had her TEE procedure earlier today, postprocedure he had an episode of worsening transient weakness, felt to be more due to anesthesia upon evaluation by neurology, currently she denies any complaints.  Discharge Exam: Vitals:   06/18/24 1225 06/18/24 1230  BP: (!) 147/102 (!) 155/94  Pulse: 73 74  Resp:    Temp:    SpO2: 98% 95%   Vitals:   06/18/24 1215 06/18/24 1220 06/18/24 1225 06/18/24 1230  BP: (!) 156/99 (!) 154/104 (!) 147/102 (!) 155/94  Pulse: 71 75 73 74  Resp:      Temp:      TempSrc:      SpO2: 98% 99% 98% 95%  Weight:      Height:        General: Pt is alert, awake, not in acute distress, she is with residual left-sided weakness and facial droop Cardiovascular: RRR, S1/S2 +, no rubs, no gallops Respiratory: CTA bilaterally, no wheezing, no rhonchi Abdominal: Soft, NT, ND, bowel sounds + Extremities: no edema, no cyanosis    The results of significant diagnostics from this hospitalization (including imaging, microbiology, ancillary and laboratory) are listed below for reference.     Microbiology: No results found for this or any previous visit (from the past 240 hours).   Labs: BNP (last 3 results) No results for input(s): BNP in the last 8760 hours. Basic Metabolic Panel: Recent Labs  Lab 06/16/24 1253 06/17/24 0223 06/18/24 0537  NA 137 139 137  K 3.3* 3.3* 3.8  CL 104 107 105  CO2 20* 23 24  GLUCOSE 129* 98 79  BUN 9 9 11   CREATININE 1.05* 0.85 0.83  CALCIUM  9.4 8.9 9.5   Liver Function Tests: Recent Labs   Lab 06/16/24 1253 06/17/24 0223  AST 21 17  ALT 13 13  ALKPHOS 68 71  BILITOT 0.6 0.5  PROT 8.0 7.2  ALBUMIN 3.6 3.1*   No results for input(s): LIPASE, AMYLASE in the last 168 hours. No results for input(s): AMMONIA in the last 168 hours.  CBC: Recent Labs  Lab 06/16/24 1253 06/17/24 0223 06/18/24 0537  WBC 5.0 5.0 4.4  NEUTROABS 2.7  --   --   HGB 13.4 12.5 13.6  HCT 41.9 38.0 42.0  MCV 87.1 86.0 85.7  PLT 248 229 214   Cardiac Enzymes: No results for input(s): CKTOTAL, CKMB, CKMBINDEX, TROPONINI in the last 168 hours. BNP: Invalid input(s): POCBNP CBG: Recent Labs  Lab 06/16/24 1318 06/18/24 1133  GLUCAP 121* 90   D-Dimer No results for input(s): DDIMER in the last 72 hours. Hgb A1c Recent Labs    06/17/24 0223  HGBA1C 5.2   Lipid Profile Recent Labs    06/17/24 0223  CHOL 165  HDL 59  LDLCALC 95  TRIG 53  CHOLHDL 2.8   Thyroid function studies No results for input(s): TSH, T4TOTAL, T3FREE, THYROIDAB in the last 72 hours.  Invalid input(s): FREET3 Anemia work up No results for input(s): VITAMINB12, FOLATE, FERRITIN, TIBC, IRON, RETICCTPCT in the last 72 hours. Urinalysis    Component Value Date/Time   COLORURINE AMBER (A) 12/25/2015 1930   APPEARANCEUR TURBID (A) 12/25/2015 1930   LABSPEC 1.021 12/25/2015 1930   PHURINE 5.0 12/25/2015 1930   GLUCOSEU NEGATIVE 12/25/2015 1930   GLUCOSEU NEG mg/dL 89/73/7990 7958   HGBUR NEGATIVE 12/25/2015 1930   BILIRUBINUR SMALL (A) 12/25/2015 1930   KETONESUR NEGATIVE 12/25/2015 1930   PROTEINUR 30 (A) 12/25/2015 1930   UROBILINOGEN 0.2 02/04/2010 0230   NITRITE NEGATIVE 12/25/2015 1930   LEUKOCYTESUR SMALL (A) 12/25/2015 1930   Sepsis Labs Recent Labs  Lab 06/16/24 1253 06/17/24 0223 06/18/24 0537  WBC 5.0 5.0 4.4   Microbiology No results found for this or any previous visit (from the past 240 hours).   Time coordinating discharge: Over 30  minutes  SIGNED:   Brayton Lye, MD  [facility] 06/18/2024, 2:05 PM Pager   If 7PM-7AM, please contact night-coverage [email or web address] Password [email or web address]

## 2024-06-18 NOTE — Interval H&P Note (Signed)
 History and Physical Interval Note:  06/18/2024 10:40 AM  Courney Segel  has presented today for surgery, with the diagnosis of CVA.  The various methods of treatment have been discussed with the patient and family. After consideration of risks, benefits and other options for treatment, the patient has consented to  Procedure(s): TRANSESOPHAGEAL ECHOCARDIOGRAM (N/A) as a surgical intervention.  The patient's history has been reviewed, patient examined, no change in status, stable for surgery.  I have reviewed the patient's chart and labs.  Questions were answered to the patient's satisfaction.     Vinie JAYSON Maxcy

## 2024-06-19 LAB — ANTIPHOSPHOLIPID SYNDROME EVAL, BLD
Anticardiolipin IgA: <9 U/mL (ref 0–11)
Anticardiolipin IgG: <9 GPL U/mL (ref 0–14)
Anticardiolipin IgM: <9 [MPL'U]/mL (ref 0–12)
DRVVT: 35.4 s (ref 0.0–47.0)
PTT Lupus Anticoagulant: 22.2 s (ref 0.0–43.5)
Phosphatydalserine, IgA: 3 U (ref 0–3)
Phosphatydalserine, IgG: 9 U (ref 0–20)
Phosphatydalserine, IgM: <10 U (ref 0–50)

## 2024-06-19 LAB — SICKLE CELL SCREEN: Sickle Cell Screen: NEGATIVE

## 2024-07-03 ENCOUNTER — Ambulatory Visit: Attending: Internal Medicine

## 2024-07-03 VITALS — BP 152/86 | HR 101

## 2024-07-03 DIAGNOSIS — R2681 Unsteadiness on feet: Secondary | ICD-10-CM | POA: Diagnosis present

## 2024-07-03 DIAGNOSIS — R293 Abnormal posture: Secondary | ICD-10-CM | POA: Diagnosis present

## 2024-07-03 DIAGNOSIS — R2689 Other abnormalities of gait and mobility: Secondary | ICD-10-CM | POA: Diagnosis present

## 2024-07-03 DIAGNOSIS — M6281 Muscle weakness (generalized): Secondary | ICD-10-CM | POA: Insufficient documentation

## 2024-07-03 NOTE — Therapy (Unsigned)
 OUTPATIENT PHYSICAL THERAPY NEURO EVALUATION   Patient Name: Allison Wilson MRN: [id number] DOB:09-Nov-1980, 43 y.o., female Today's Date: 07/04/2024   PCP: none REFERRING PROVIDER: Brayton Lye, MD  END OF SESSION:  PT End of Session - 07/03/24 1529     Visit Number 1    Number of Visits 5    Date for PT Re-Evaluation 08/02/24    Authorization Type BC[employer] PPO- auth required    PT Start Time 1531    PT Stop Time 1609    PT Time Calculation (min) 38 min    Equipment Utilized During Treatment Gait belt    Activity Tolerance Patient tolerated treatment well    Behavior During Therapy WFL for tasks assessed/performed          Past Medical History:  Diagnosis Date   Anemia    Hepatitis A    HIV (human immunodeficiency virus infection) (HCC)    Hypertension    Past Surgical History:  Procedure Laterality Date   TRANSESOPHAGEAL ECHOCARDIOGRAM (CATH LAB) N/A 06/18/2024   Procedure: TRANSESOPHAGEAL ECHOCARDIOGRAM;  Surgeon: Mona Vinie BROCKS, MD;  Location: [facility];  Service: Cardiovascular;  Laterality: N/A;   Patient Active Problem List   Diagnosis Date Noted   HLD (hyperlipidemia) 06/17/2024   Acute CVA (cerebrovascular accident) (HCC) 06/16/2024   Essential hypertension 06/16/2024   Diarrhea 12/02/2020   Renal insufficiency 10/30/2018   Encounter for long-term (current) use of high-risk medication 04/09/2018   LGSIL on Pap smear of cervix 04/10/2017   HIV disease (HCC) 11/28/2016   Screening examination for venereal disease 12/24/2015   History of Mycobacterium avium intracellulare infection 12/24/2015   Anemia 11/30/2015   Gallbladder disease    Acalculous cholecystitis 11/27/2015   Pulmonary tuberculosis 12/12/2006   Poor dentition 12/12/2006    ONSET DATE: 06/18/24  REFERRING DIAG: I63.9 (ICD-10-CM) - CVA (cerebral vascular accident) (HCC)   THERAPY DIAG:  Abnormal posture - Plan: PT plan of care cert/re-cert  Muscle weakness (generalized) -  Plan: PT plan of care cert/re-cert  Unsteadiness on feet - Plan: PT plan of care cert/re-cert  Rationale for Evaluation and Treatment: Rehabilitation  SUBJECTIVE:                                                                                                                                                                                             SUBJECTIVE STATEMENT: Patient arrives to clinic with uncle who remains in the lobby. No AD. She recently had a stroke that effected her L LE and UE. She endorses difficulty with dressing and showering in terms of weakness in her LE.  Pt accompanied  by: family member  PERTINENT HISTORY: Hep A, HIV, HTN  PAIN:  Are you having pain? No  PRECAUTIONS: Fall   WEIGHT BEARING RESTRICTIONS: No  FALLS: Has patient fallen in last 6 months? 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  DIAGNOSTIC FINDINGS: 06/16/24 brain MRI  IMPRESSION: Acute perforator infarct in the right basal ganglia. No substantial mass effect.  COGNITION: Overall cognitive status: Within functional limits for tasks assessed   SENSATION: WFL  COORDINATION: Dysmetric L LE figure 8, limited by weakness for heel shin Dysdiadochokinesia L UE/L LE   POSTURE: No Significant postural limitations   LOWER EXTREMITY MMT:    MMT Right Eval Left Eval  Hip flexion 5 3+  Hip extension    Hip abduction 5 4  Hip adduction 5 4  Hip internal rotation    Hip external rotation    Knee flexion 5 4  Knee extension 5 4  Ankle dorsiflexion 5 4  Ankle plantarflexion     Ankle inversion    Ankle eversion    (Blank rows = not tested)  BED MOBILITY:  Endorses mild difficulty with L LE management   GAIT: Findings: Gait Characteristics: step through pattern, decreased arm swing- Left, decreased hip/knee flexion- Left, and trendelenburg and Assistive device utilized:None  FUNCTIONAL TESTS:   [facility] PT Assessment - 07/03/24 0001       Standardized Balance Assessment   Standardized Balance Assessment Timed Up and Go Test    Five times sit to stand comments  13.34s B UE    10 Meter Walk .67m/s      Timed Up and Go Test   Normal TUG (seconds) 17.97      Functional Gait  Assessment   Gait assessed  Yes    Gait Level Surface Walks 20 ft, slow speed, abnormal gait pattern, evidence for imbalance or deviates 10-15 in outside of the 12 in walkway width. Requires more than 7 sec to ambulate 20 ft.    Change in Gait Speed Able to change speed, demonstrates mild gait deviations, deviates 6-10 in outside of the 12 in walkway width, or no gait deviations, unable to achieve a major change in velocity, or uses a change in velocity, or uses an assistive device.    Gait with Horizontal Head Turns Performs head turns smoothly with no change in gait. Deviates no more than 6 in outside 12 in walkway width    Gait with Vertical Head Turns Performs head turns with no change in gait. Deviates no more than 6 in outside 12 in walkway width.    Gait and Pivot Turn Pivot turns safely in greater than 3 sec and stops with no loss of balance, or pivot turns safely within 3 sec and stops with mild imbalance, requires small steps to catch balance.    Step Over Obstacle Is able to step over one shoe box (4.5 in total height) without changing gait speed. No evidence of imbalance.    Gait with Narrow Base of Support Ambulates 7-9 steps.    Gait with Eyes Closed Walks 20 ft, uses assistive device, slower speed, mild gait  deviations, deviates 6-10 in outside 12 in walkway width. Ambulates 20 ft  in less than 9 sec but greater than 7 sec.    Ambulating Backwards Walks 20 ft, uses assistive device, slower speed, mild gait deviations, deviates 6-10 in outside 12 in walkway width.    Steps Two feet to a stair, must use rail.    Total Score 20                                                                                                                                     TREATMENT  Self care/home management:  -return to work potential  -initial HEP   PATIENT EDUCATION: Education details: PT POC, exam findings, see above, initial HEP Person educated: Patient Education method: Explanation, Demonstration, and Handouts Education comprehension: verbalized understanding and needs further education  HOME EXERCISE PROGRAM: Access Code: [email or web address] URL: [email or web address] Date: 07/03/2024 Prepared by: Delon Pop  Exercises - Standing 3-Way Leg Reach with Resistance at Ankles and Counter Support  - 1 x daily - 7 x weekly - 3 sets - 10 reps  GOALS: Goals reviewed with patient? Yes  SHORT TERM GOALS: = LTG based on PT POC length   LONG TERM GOALS: Target date: 08/02/24  Pt will be independent with final HEP for improved functional strength and mobility  Baseline: to be provided Goal status: INITIAL  2.  Pt will improve 5x STS to </= 11 sec to demo improved functional LE strength and balance   Baseline: 13.34s B UE Goal status: INITIAL  3.  Pt will improve gait speed to >/= .58m/s to demonstrate improved community ambulation  Baseline: .33m/s Goal status: INITIAL  4.  Pt will improve TUG to </= 13 secs to demonstrated reduced fall risk  Baseline: 17.97s  Goal status: INITIAL  5.  Pt will improve FGA to >/= 25/30 to demonstrate improved balance and reduced fall risk  Baseline: 20/30 Goal status: INITIAL   ASSESSMENT:  CLINICAL IMPRESSION: Patient is a 43 y.o. female who was seen today for physical therapy evaluation and treatment for impaired  mobility s/p CVA. 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 Patient completed the Timed Up and Go test (TUG) in 17.97 seconds.  Geriatrics: need for further assessment of fall risk: >= 12 sec; Recurrent falls: > 15 sec; Vestibular Disorders fall risk: > 15 sec; Parkinson's Disease fall risk: > 16 sec ([email or web address], 2023). Five times Sit to Stand Test (FTSS) Method: Use a straight back chair with a solid seat that is 17-18" high. Ask participant to sit on the chair with arms folded across their chest.   Instructions: "Stand up and sit down as quickly as possible 5 times, keeping your arms folded across your chest."   Measurement: Stop timing when the participant touches the chair in sitting the 5th time.  TIME: 13.34 sec  with B UE  Cut off scores indicative of increased fall risk: >12 sec CVA, >16 sec PD, >13 sec vestibular (ANPTA Core Set of Outcome Measures for Adults with Neurologic Conditions, 2018). 10 Meter Walk Test: Patient instructed to walk 10 meters (32.8 ft) as quickly and as safely as possible at their normal speed x2 and at a fast speed x2. Time measured from 2 meter mark to 8 meter mark to accommodate ramp-up and ramp-down.  Normal speed: .23m/s Cut off scores: <0.4 m/s = household Ambulator, 0.4-0.8 m/s = limited community Ambulator, >0.8 m/s = community Ambulator, >1.2 m/s = crossing a street, <1.0 = increased fall risk MCID 0.05 m/s (small), 0.13 m/s (moderate), 0.06 m/s (significant)  (ANPTA Core Set of Outcome Measures for Adults with Neurologic Conditions, 2018). Patient demonstrates increased fall risk as noted by score of 20/30 on  Functional Gait Assessment.   <22/30 = predictive of falls, <20/30 = fall in 6 months, <18/30 = predictive of falls in PD MCID: 5 points stroke population, 4 points geriatric population (ANPTA Core Set of Outcome Measures for Adults with Neurologic Conditions, 2018).  She would benefit from skilled PT services to address the above mentioned deficits.   OBJECTIVE IMPAIRMENTS: Abnormal gait, cardiopulmonary status limiting activity, decreased coordination, decreased knowledge of condition, decreased strength, and impaired UE functional use.   ACTIVITY LIMITATIONS: carrying, lifting, bending, stairs, transfers, bed mobility, hygiene/grooming, locomotion level, and caring for others  PARTICIPATION LIMITATIONS: meal prep, cleaning, laundry, interpersonal relationship, driving, shopping, community activity, and occupation  PERSONAL FACTORS: Fitness, Past/current experiences, Profession, Time since onset of injury/illness/exacerbation, Transportation, and 3+ comorbidities: see above are also affecting patient's functional outcome.   REHAB POTENTIAL: Good  CLINICAL DECISION MAKING: Stable/uncomplicated  EVALUATION COMPLEXITY: Low  PLAN:  PT FREQUENCY: 1x/week  PT DURATION: 4 weeks  PLANNED INTERVENTIONS: 02835- PT Re-evaluation, 97750- Physical Performance Testing, 97110-Therapeutic exercises, 97530- Therapeutic activity, [id number]- Neuromuscular re-education, 97535- Self Care, 02859- Manual therapy, [phone]- Gait training, [phone]- Orthotic Initial, [phone]- Orthotic/Prosthetic subsequent, [phone]- Canalith repositioning, [phone]- Aquatic Therapy, [phone]- Electrical stimulation (manual), Patient/Family education, Balance training, Stair training, Vestibular training, Visual/preceptual remediation/compensation, and DME instructions  PLAN FOR NEXT SESSION: L hip strengthening!, lifting mechanics- 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

## 2024-07-10 ENCOUNTER — Ambulatory Visit: Admitting: Physical Therapy

## 2024-07-10 VITALS — BP 134/88 | HR 109

## 2024-07-10 DIAGNOSIS — R2681 Unsteadiness on feet: Secondary | ICD-10-CM

## 2024-07-10 DIAGNOSIS — M6281 Muscle weakness (generalized): Secondary | ICD-10-CM

## 2024-07-10 DIAGNOSIS — R293 Abnormal posture: Secondary | ICD-10-CM | POA: Diagnosis not present

## 2024-07-10 NOTE — Therapy (Signed)
 OUTPATIENT PHYSICAL THERAPY NEURO TREATMENT    Patient Name: Allison Wilson MRN: [id number] DOB:02/21/1981, 43 y.o., female Today's Date: 07/10/2024   PCP: none REFERRING PROVIDER: Brayton Lye, MD  END OF SESSION:  PT End of Session - 07/10/24 1440     Visit Number 2    Number of Visits 5    Date for PT Re-Evaluation 08/02/24    Authorization Type [employer] PPO- auth required    Authorization Time Period 07/03/24 - 08/02/24 12 PT visits    PT Start Time 1438    PT Stop Time 1519    PT Time Calculation (min) 41 min    Equipment Utilized During Treatment Gait belt    Activity Tolerance Patient tolerated treatment well    Behavior During Therapy WFL for tasks assessed/performed           Past Medical History:  Diagnosis Date   Anemia    Hepatitis A    HIV (human immunodeficiency virus infection) (HCC)    Hypertension    Past Surgical History:  Procedure Laterality Date   TRANSESOPHAGEAL ECHOCARDIOGRAM (CATH LAB) N/A 06/18/2024   Procedure: TRANSESOPHAGEAL ECHOCARDIOGRAM;  Surgeon: Mona Vinie BROCKS, MD;  Location: [facility];  Service: Cardiovascular;  Laterality: N/A;   Patient Active Problem List   Diagnosis Date Noted   HLD (hyperlipidemia) 06/17/2024   Acute CVA (cerebrovascular accident) (HCC) 06/16/2024   Essential hypertension 06/16/2024   Diarrhea 12/02/2020   Renal insufficiency 10/30/2018   Encounter for long-term (current) use of high-risk medication 04/09/2018   LGSIL on Pap smear of cervix 04/10/2017   HIV disease (HCC) 11/28/2016   Screening examination for venereal disease 12/24/2015   History of Mycobacterium avium intracellulare infection 12/24/2015   Anemia 11/30/2015   Gallbladder disease    Acalculous cholecystitis 11/27/2015   Pulmonary tuberculosis 12/12/2006   Poor dentition 12/12/2006    ONSET DATE: 06/18/24  REFERRING DIAG: I63.9 (ICD-10-CM) - CVA (cerebral vascular accident) (HCC)   THERAPY DIAG:  Unsteadiness on  feet  Muscle weakness (generalized)  Abnormal posture  Rationale for Evaluation and Treatment: Rehabilitation  SUBJECTIVE:                                                                                                                                                                                             SUBJECTIVE STATEMENT: Patient presents she is tired today. No falls or acute changes.   Pt accompanied by: family member (in lobby)  PERTINENT HISTORY: Hep A, HIV, HTN  PAIN:  Are you having pain? No  PRECAUTIONS: Fall   WEIGHT BEARING RESTRICTIONS: No  FALLS: Has patient fallen  in last 6 months? 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  DIAGNOSTIC FINDINGS: 06/16/24 brain MRI  IMPRESSION: Acute perforator infarct in the right basal ganglia. No substantial mass effect.  COGNITION: Overall cognitive status: Within functional limits for tasks assessed   SENSATION: WFL  COORDINATION: Dysmetric L LE figure 8, limited by weakness for heel shin Dysdiadochokinesia L UE/L LE   POSTURE: No Significant postural limitations   LOWER EXTREMITY MMT:    MMT Right Eval Left Eval  Hip flexion 5 3+  Hip extension    Hip abduction 5 4  Hip adduction 5 4  Hip internal rotation    Hip external rotation    Knee flexion 5 4  Knee extension 5 4  Ankle dorsiflexion 5 4  Ankle plantarflexion    Ankle inversion    Ankle eversion    (Blank rows = not tested)  BED MOBILITY:  Endorses mild difficulty with L LE management   GAIT: Findings: Gait  Characteristics: step through pattern, decreased arm swing- Left, decreased hip/knee flexion- Left, and trendelenburg and Assistive device utilized:None  FUNCTIONAL TESTS:    VITALS  Vitals:   07/10/24 1442  BP: 134/88  Pulse: (!) 109 Comment: Was 98 bpm on pulse ox                                                                                                                                TREATMENT  Self-care/home management  Assessed vitals (see above) and BP WNL but HR elevated. Assessed via pulse Ox and HR did reduce to 98 bpm, so continued to monitor throughout session   Ther Act  SciFit multi-peaks level 7.5 for 8 minutes using BUE/BLEs for neural priming for reciprocal movement, dynamic cardiovascular warmup and increased amplitude of stepping. RPE of 8-9/10 and HR 104 bpm following activity. For improved reactive balance strategies, functional endurance and lateral weight shifting, carrying 15# surge x230 w/SBA. Pt reporting high levels of fatigue and noted decreased step clearance w/LLE after 150'. HR at 128 bpm following activity that did reduce to 114 bpm after 1 minute.  6 box step ups while holding 10# KB, x12 reps per side, for improved step clearance, single leg stability and functional hip strength. Pt frequently catching her foot on edge of step w/LLE but did improve step clearance w/practice.  Eccentric fwd heel taps from 6 step w/BUE support, x15 reps per side. Min cues for improved eccentric control of LLE and to reduce reliance on BUEs to perform. HR 130 bpm following activity.  To imitate pushing vacuum,  had pt push therapist in [facility] x115' fwd and 115' retro w/SBA. Pt w/increased difficulty in fwd direction but no LOB noted RPE of 4/10 amd HR 108 bpm following session    PATIENT EDUCATION: Education details: Continue HEP Person educated: Patient Education method: Explanation Education comprehension: verbalized understanding and needs further education  HOME  EXERCISE PROGRAM: Access Code: [email or web address] URL: [email or web address] Date: 07/03/2024 Prepared by: Delon Pop  Exercises - Standing 3-Way Leg Reach with Resistance at Ankles and Counter Support  - 1 x daily - 7 x weekly - 3 sets - 10 reps  GOALS: Goals reviewed with patient? Yes  SHORT TERM GOALS: = LTG based on PT POC length   LONG TERM GOALS: Target date: 08/02/24  Pt will be independent with final HEP for improved functional strength and mobility  Baseline: to be provided Goal status: INITIAL  2.  Pt will improve 5x STS to </= 11 sec to demo improved functional LE strength and balance   Baseline: 13.34s B UE Goal status: INITIAL  3.  Pt will improve gait speed to >/= .55m/s to demonstrate improved community ambulation  Baseline: .6m/s Goal status: INITIAL  4.  Pt will improve TUG to </= 13 secs to demonstrated reduced fall risk  Baseline: 17.97s  Goal status: INITIAL  5.  Pt will improve FGA to >/= 25/30 to demonstrate improved balance and reduced fall risk  Baseline: 20/30 Goal status: INITIAL   ASSESSMENT:  CLINICAL IMPRESSION: Emphasis of skilled PT session on monitoring cardiac response to activity, functional BLE/hip strength and imitation of work-related tasks. Pt's HR tachycardic today, averaging 109 bpm at rest and increasing to 130 bpm w/short light-moderate activity. Pt reported being fatigued but otherwise denied lightheadedness or dizziness. Continue POC.   OBJECTIVE IMPAIRMENTS: Abnormal gait, cardiopulmonary status limiting activity, decreased coordination, decreased knowledge of condition, decreased strength, and impaired UE functional use.   ACTIVITY LIMITATIONS: carrying, lifting, bending, stairs, transfers, bed mobility, hygiene/grooming, locomotion level, and caring for others  PARTICIPATION LIMITATIONS: meal prep, cleaning, laundry, interpersonal relationship, driving, shopping, community activity, and occupation  PERSONAL  FACTORS: Fitness, Past/current experiences, Profession, Time since onset of injury/illness/exacerbation, Transportation, and 3+ comorbidities: see above are also affecting patient's functional outcome.   REHAB POTENTIAL: Good  CLINICAL DECISION MAKING: Stable/uncomplicated  EVALUATION COMPLEXITY: Low  PLAN:  PT FREQUENCY: 1x/week  PT DURATION: 4 weeks  PLANNED INTERVENTIONS: 97164- PT Re-evaluation, 97750- Physical Performance Testing, 97110-Therapeutic exercises, 97530- Therapeutic activity, [id number]- Neuromuscular re-education, 97535- Self Care, 02859- Manual therapy, [id number]- Gait training, [phone]- Orthotic Initial, [phone]- Orthotic/Prosthetic subsequent, [phone]- Canalith repositioning, [phone]- Aquatic Therapy, [phone]- Electrical stimulation (manual), Patient/Family education, Balance training, Stair training, Vestibular training, Visual/preceptual remediation/compensation, and DME instructions  PLAN FOR NEXT SESSION: L hip strengthening!, lifting mechanics- 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 Monitor vitals!    Charistopher Rumble E Kaj Vasil, PT, DPT  07/10/2024, 3:24 PM

## 2024-07-17 ENCOUNTER — Ambulatory Visit: Admitting: Physical Therapy

## 2024-07-17 VITALS — BP 106/78 | HR 87

## 2024-07-17 DIAGNOSIS — M6281 Muscle weakness (generalized): Secondary | ICD-10-CM

## 2024-07-17 DIAGNOSIS — R2689 Other abnormalities of gait and mobility: Secondary | ICD-10-CM

## 2024-07-17 DIAGNOSIS — R293 Abnormal posture: Secondary | ICD-10-CM | POA: Diagnosis not present

## 2024-07-17 DIAGNOSIS — R2681 Unsteadiness on feet: Secondary | ICD-10-CM

## 2024-07-17 NOTE — Therapy (Signed)
 OUTPATIENT PHYSICAL THERAPY NEURO TREATMENT    Patient Name: Allison Wilson MRN: [id number] DOB:02-05-1981, 43 y.o., female Today's Date: 07/17/2024   PCP: none REFERRING PROVIDER: Brayton Lye, MD  END OF SESSION:  PT End of Session - 07/17/24 1513     Visit Number 3    Number of Visits 5    Date for Recertification  08/02/24    Authorization Type BCBS PPO- auth required    Authorization Time Period 07/03/24 - 08/02/24 12 PT visits    PT Start Time 1511    PT Stop Time 1551    PT Time Calculation (min) 40 min    Equipment Utilized During Treatment --    Activity Tolerance Patient tolerated treatment well    Behavior During Therapy WFL for tasks assessed/performed            Past Medical History:  Diagnosis Date   Anemia    Hepatitis A    HIV (human immunodeficiency virus infection) (HCC)    Hypertension    Past Surgical History:  Procedure Laterality Date   TRANSESOPHAGEAL ECHOCARDIOGRAM (CATH LAB) N/A 06/18/2024   Procedure: TRANSESOPHAGEAL ECHOCARDIOGRAM;  Surgeon: Mona Vinie BROCKS, MD;  Location: [facility];  Service: Cardiovascular;  Laterality: N/A;   Patient Active Problem List   Diagnosis Date Noted   HLD (hyperlipidemia) 06/17/2024   Acute CVA (cerebrovascular accident) (HCC) 06/16/2024   Essential hypertension 06/16/2024   Diarrhea 12/02/2020   Renal insufficiency 10/30/2018   Encounter for long-term (current) use of high-risk medication 04/09/2018   LGSIL on Pap smear of cervix 04/10/2017   HIV disease (HCC) 11/28/2016   Screening examination for venereal disease 12/24/2015   History of Mycobacterium avium intracellulare infection 12/24/2015   Anemia 11/30/2015   Gallbladder disease    Acalculous cholecystitis 11/27/2015   Pulmonary tuberculosis 12/12/2006   Poor dentition 12/12/2006    ONSET DATE: 06/18/24  REFERRING DIAG: I63.9 (ICD-10-CM) - CVA (cerebral vascular accident) (HCC)   THERAPY DIAG:  Unsteadiness on  feet  Muscle weakness (generalized)  Other abnormalities of gait and mobility  Rationale for Evaluation and Treatment: Rehabilitation  SUBJECTIVE:                                                                                                                                                                                             SUBJECTIVE STATEMENT: Patient reports she is feeling much better today. No falls or acute changes   Pt accompanied by: family member (in lobby)  PERTINENT HISTORY: Hep A, HIV, HTN  PAIN:  Are you having pain? No  PRECAUTIONS: Fall   WEIGHT BEARING RESTRICTIONS:  No  FALLS: Has patient fallen in last 6 months? 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  DIAGNOSTIC FINDINGS: 06/16/24 brain MRI  IMPRESSION: Acute perforator infarct in the right basal ganglia. No substantial mass effect.  COGNITION: Overall cognitive status: Within functional limits for tasks assessed   SENSATION: WFL  COORDINATION: Dysmetric L LE figure 8, limited by weakness for heel shin Dysdiadochokinesia L UE/L LE   POSTURE: No Significant postural limitations   LOWER EXTREMITY MMT:    MMT Right Eval Left Eval  Hip flexion 5 3+  Hip extension    Hip abduction 5 4  Hip adduction 5 4  Hip internal rotation    Hip external rotation    Knee flexion 5 4  Knee extension 5 4  Ankle dorsiflexion 5 4  Ankle plantarflexion    Ankle inversion    Ankle eversion    (Blank rows = not tested)  BED MOBILITY:  Endorses mild difficulty with L LE management    GAIT: Findings: Gait Characteristics: step through pattern, decreased arm swing- Left, decreased hip/knee flexion- Left, and trendelenburg and Assistive device utilized:None  FUNCTIONAL TESTS:   [facility] PT Assessment - 07/17/24 1532       Transfers   Five time sit to stand comments  10.21s w/BUE support      Balance   Balance Assessed Yes      Standardized Balance Assessment   Standardized Balance Assessment 10 meter walk test    10 Meter Walk 0.78 m/s   62m over 12.68s  no AD         VITALS  Vitals:   07/17/24 1515  BP: 106/78  Pulse: 87                                                                                                                                 TREATMENT  Self-care/home management  Assessed vitals (see above) and BP WNL    Ther Act  SciFit multi-peaks level 11.0 for 8 minutes using BUE/BLEs for neural priming for reciprocal movement, dynamic cardiovascular warmup and increased amplitude of stepping. RPE of 5/10 following activity. Educated pt on proper deadlift technique using 20# KB and pt performed 10 reps w/good form for improved posterior chain strength, facilitation of hip hinge and proper lifting technique to reduce injury risk at work. Pt performed independently w/no pain reported or instability noted  Progressed to B-stance deadlifts, x10 per side w/20# KB. Pt more challenged when LLE forward, but no instability noted  10# med ball slams w/squat to pickup, x18  reps, for continued reinforcement of proper lifting technique, functional BLE strength, endurance and high amplitude movement.   Physical Performance   [facility] PT Assessment - 07/17/24 1532       Transfers   Five time sit to stand comments  10.21s w/BUE support      Balance   Balance Assessed Yes      Standardized Balance Assessment   Standardized Balance Assessment 10 meter walk test    10 Meter Walk 0.78 m/s   33m over 12.68s  no AD       Discussed results of goals and  potential to DC next session. Pt verbalized agreement and reports she feels a lot stronger and plans to return to work in early October.   NMR  6 Blaze pods on random reach setting for improved hip flexor strength, single leg stability and step clearance w/LLE.  Performed on 2 minute intervals with 30s rest periods.  Pt requires distant SBA guarding and light BUE support. Round 1:  placed red resistance band around L ankle and provided posterior resistance throughout. Placed 3 pods on first step and 3 pods on second step at stairway and cued pt to tap pods w/LLE > RLE.  43 hits. Round 2:  Same setup.  51 hits. Notable errors/deficits:  Pt reported high levels of fatigue and relied on vaulting to clear LLE to top step w/fatigue, but no LOB noted  Quadruped bird dogs on mat table, x7 reps per side, for improved reciprocal coordination and proximal stability. Pt very challenged by stability on LLE but able to perform if she takes her time. Added to HEP (see bolded below)    PATIENT EDUCATION: Education details: Updates to HEP, goal results so far Person educated: Patient Education method: Explanation, Demonstration, Verbal cues, and Handouts Education comprehension: verbalized understanding, returned demonstration, verbal cues required, and needs further education  HOME EXERCISE PROGRAM: Access Code: [email or web address] URL: [email or web address] Date: 07/03/2024 Prepared by: Delon Pop  Exercises - Standing 3-Way Leg Reach with Resistance at Ankles and Counter Support  - 1 x daily - 7 x weekly - 3 sets - 10 reps - Bird Dog  - 1 x daily - 7 x weekly - 3 sets - 10 reps  GOALS: Goals reviewed with patient? Yes  SHORT TERM GOALS: = LTG based on PT POC length   LONG TERM GOALS: Target date: 08/02/24  Pt will be independent with final HEP for improved functional strength and mobility  Baseline: to be provided Goal status: INITIAL  2.  Pt will improve 5x STS to </= 11 sec to demo  improved functional LE strength and balance   Baseline: 13.34s B UE; 10.21s w/BUE (9/24) Goal status: MET  3.  Pt will improve gait speed to >/= .46m/s to demonstrate improved community ambulation  Baseline: .90m/s; 0.78 m/s no AD (9/24) Goal status: MET   4.  Pt will improve TUG to </= 13 secs to demonstrated reduced fall risk  Baseline: 17.97s  Goal status: INITIAL  5.  Pt will improve FGA to >/= 25/30 to demonstrate improved balance and reduced fall risk  Baseline: 20/30 Goal status: INITIAL   ASSESSMENT:  CLINICAL IMPRESSION: Emphasis of skilled PT session on facilitation of hip hinge to educate on proper lifting technique, single leg stability, posterior chain strength and initiating LTG assessment. Pt met 2 of 5 LTGs today, improving her 5x STS w/light BUE support and gait speed w/no AD. Pt is doing very well and reports feeling much more  like herself. Discussed potential to DC next session, but will assess FGA and determine plan as pt continues to report weakness in LLE. Pt demonstrated good lifting technique of 20# object w/no pain or instability and was most challenged by resisted blaze pods, but was able to perform without LOB. Continue POC.   OBJECTIVE IMPAIRMENTS: Abnormal gait, cardiopulmonary status limiting activity, decreased coordination, decreased knowledge of condition, decreased strength, and impaired UE functional use.   ACTIVITY LIMITATIONS: carrying, lifting, bending, stairs, transfers, bed mobility, hygiene/grooming, locomotion level, and caring for others  PARTICIPATION LIMITATIONS: meal prep, cleaning, laundry, interpersonal relationship, driving, shopping, community activity, and occupation  PERSONAL FACTORS: Fitness, Past/current experiences, Profession, Time since onset of injury/illness/exacerbation, Transportation, and 3+ comorbidities: see above are also affecting patient's functional outcome.   REHAB POTENTIAL: Good  CLINICAL DECISION MAKING:  Stable/uncomplicated  EVALUATION COMPLEXITY: Low  PLAN:  PT FREQUENCY: 1x/week  PT DURATION: 4 weeks  PLANNED INTERVENTIONS: 97164- PT Re-evaluation, 97750- Physical Performance Testing, 97110-Therapeutic exercises, 97530- Therapeutic activity, [id number]- Neuromuscular re-education, 97535- Self Care, [id number]- Manual therapy, [id number]- Gait training, [phone]- Orthotic Initial, [phone]- Orthotic/Prosthetic subsequent, [phone]- Canalith repositioning, [id number]- Aquatic Therapy, [phone]- Electrical stimulation (manual), Patient/Family education, Balance training, Stair training, Vestibular training, Visual/preceptual remediation/compensation, and DME instructions  PLAN FOR NEXT SESSION: Goals and DC? 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 Monitor vitals!    Avryl Roehm E Kirstan Fentress, PT, DPT  07/17/2024, 4:00 PM

## 2024-07-18 ENCOUNTER — Other Ambulatory Visit (HOSPITAL_COMMUNITY): Payer: Self-pay

## 2024-07-22 ENCOUNTER — Other Ambulatory Visit: Payer: Self-pay

## 2024-07-22 ENCOUNTER — Other Ambulatory Visit: Payer: Self-pay | Admitting: Pharmacy Technician

## 2024-07-22 NOTE — Progress Notes (Signed)
 Specialty Pharmacy Refill Coordination Note  Allison Wilson is a 43 y.o. female contacted today regarding refills of specialty medication(s) Bictegravir-Emtricitab-Tenofov (BIKTARVY )   Patient requested Delivery   Delivery date: 07/24/24   Verified address: [address]   [name] [phone]   Medication will be filled on 07/23/24.

## 2024-07-23 ENCOUNTER — Other Ambulatory Visit: Payer: Self-pay

## 2024-07-24 ENCOUNTER — Ambulatory Visit: Attending: Internal Medicine | Admitting: Physical Therapy

## 2024-07-24 VITALS — BP 112/82 | HR 94

## 2024-07-24 DIAGNOSIS — M6281 Muscle weakness (generalized): Secondary | ICD-10-CM | POA: Diagnosis present

## 2024-07-24 DIAGNOSIS — R2689 Other abnormalities of gait and mobility: Secondary | ICD-10-CM | POA: Diagnosis present

## 2024-07-24 DIAGNOSIS — R2681 Unsteadiness on feet: Secondary | ICD-10-CM | POA: Insufficient documentation

## 2024-07-24 NOTE — Therapy (Signed)
 OUTPATIENT PHYSICAL THERAPY NEURO TREATMENT - DISCHARGE SUMMARY    Patient Name: Allison Wilson MRN: [id number] DOB:03-31-1981, 43 y.o., female Today's Date: 07/24/2024   PCP: none REFERRING PROVIDER: Brayton Lye, MD    PHYSICAL THERAPY DISCHARGE SUMMARY  Visits from Start of Care: 4  Current functional level related to goals / functional outcomes: Pt is independent w/all ADLs   Remaining deficits: Low fall risk, decreased cardiovascular endurance, mild LLE weakness    Education / Equipment: HEP   Patient agrees to discharge. Patient goals were met. Patient is being discharged due to meeting the stated rehab goals.   END OF SESSION:  PT End of Session - 07/24/24 1450     Visit Number 4    Number of Visits 5    Date for Recertification  08/02/24    Authorization Type BCBS PPO- auth required    Authorization Time Period 07/03/24 - 08/02/24 12 PT visits    PT Start Time 1449    PT Stop Time 1506   DC   PT Time Calculation (min) 17 min    Equipment Utilized During Treatment Gait belt    Activity Tolerance Patient tolerated treatment well    Behavior During Therapy WFL for tasks assessed/performed            Past Medical History:  Diagnosis Date   Anemia    Hepatitis A    HIV (human immunodeficiency virus infection) (HCC)    Hypertension    Past Surgical History:  Procedure Laterality Date   TRANSESOPHAGEAL ECHOCARDIOGRAM (CATH LAB) N/A 06/18/2024   Procedure: TRANSESOPHAGEAL ECHOCARDIOGRAM;  Surgeon: Mona Vinie BROCKS, MD;  Location: [facility];  Service: Cardiovascular;  Laterality: N/A;   Patient Active Problem List   Diagnosis Date Noted   HLD (hyperlipidemia) 06/17/2024   Acute CVA (cerebrovascular accident) (HCC) 06/16/2024   Essential hypertension 06/16/2024   Diarrhea 12/02/2020   Renal insufficiency 10/30/2018   Encounter for long-term (current) use of high-risk medication 04/09/2018   LGSIL on Pap smear of cervix 04/10/2017   HIV  disease (HCC) 11/28/2016   Screening examination for venereal disease 12/24/2015   History of Mycobacterium avium intracellulare infection 12/24/2015   Anemia 11/30/2015   Gallbladder disease    Acalculous cholecystitis 11/27/2015   Pulmonary tuberculosis 12/12/2006   Poor dentition 12/12/2006    ONSET DATE: 06/18/24  REFERRING DIAG: I63.9 (ICD-10-CM) - CVA (cerebral vascular accident) (HCC)   THERAPY DIAG:  Unsteadiness on feet  Muscle weakness (generalized)  Other abnormalities of gait and mobility  Rationale for Evaluation and Treatment: Rehabilitation  SUBJECTIVE:  SUBJECTIVE STATEMENT: Patient reports she is feeling great. No acute changes or falls to report.   Pt accompanied by: family member (in lobby)  PERTINENT HISTORY: Hep A, HIV, HTN  PAIN:  Are you having pain? No  PRECAUTIONS: Fall   WEIGHT BEARING RESTRICTIONS: No  FALLS: Has patient fallen in last 6 months? 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  DIAGNOSTIC FINDINGS: 06/16/24 brain MRI  IMPRESSION: Acute perforator infarct in the right basal ganglia. No substantial mass effect.  COGNITION: Overall cognitive status: Within functional limits for tasks assessed   SENSATION: WFL  COORDINATION: Dysmetric L LE figure 8, limited by weakness for heel shin Dysdiadochokinesia L UE/L LE   POSTURE: No  Significant postural limitations   LOWER EXTREMITY MMT:    MMT Right Eval Left Eval  Hip flexion 5 3+  Hip extension    Hip abduction 5 4  Hip adduction 5 4  Hip internal rotation    Hip external rotation    Knee flexion 5 4  Knee extension 5 4  Ankle dorsiflexion 5 4  Ankle plantarflexion    Ankle inversion    Ankle eversion    (Blank rows = not tested)  BED MOBILITY:  Endorses mild difficulty with L LE management   GAIT: Findings: Gait Characteristics: step through pattern, decreased arm swing- Left, decreased hip/knee flexion- Left, and trendelenburg and Assistive device utilized:None  FUNCTIONAL TESTS:   [facility] PT Assessment - 07/24/24 1455       Balance   Balance Assessed Yes      Standardized Balance Assessment   Standardized Balance Assessment Berg Balance Test      Timed Up and Go Test   Normal TUG (seconds) 11.09   No AD     Functional Gait  Assessment   Gait assessed  Yes    Gait Level Surface Walks 20 ft in less than 7 sec but greater than 5.5 sec, uses assistive device, slower speed, mild gait deviations, or deviates 6-10 in outside of the 12 in walkway width.   6.41s   Change in Gait Speed Able to change speed, demonstrates mild gait deviations, deviates 6-10 in outside of the 12 in walkway width, or no gait deviations, unable to achieve a major change in velocity, or uses a change in velocity, or uses an assistive device.    Gait with Horizontal Head Turns Performs head turns smoothly with no change in gait. Deviates no more than 6 in outside 12 in walkway width    Gait with Vertical Head Turns Performs head turns with no change in gait. Deviates no more than 6 in outside 12 in walkway width.    Gait and Pivot Turn Pivot turns safely within 3 sec and stops quickly with no loss of balance.    Step Over Obstacle Is able to step over 2 stacked shoe boxes taped together (9 in total height) without changing gait speed. No evidence of imbalance.    Gait with  Narrow Base of Support Ambulates 7-9 steps.    Gait with Eyes Closed Walks 20 ft, uses assistive device, slower speed, mild  gait deviations, deviates 6-10 in outside 12 in walkway width. Ambulates 20 ft in less than 9 sec but greater than 7 sec.   7.81s   Ambulating Backwards Walks 20 ft, uses assistive device, slower speed, mild gait deviations, deviates 6-10 in outside 12 in walkway width.   14.1s   Steps Alternating feet, no rail.    Total Score 25    FGA comment: 25/30, low fall risk           VITALS  Vitals:   07/24/24 1453  BP: 112/82  Pulse: 94                                                                                                                                 TREATMENT  Self-care/home management  Assessed vitals in LUE (see above) and BP WNL    Physical Performance   [facility] PT Assessment - 07/24/24 1455       Balance   Balance Assessed Yes      Standardized Balance Assessment   Standardized Balance Assessment Berg Balance Test      Timed Up and Go Test   Normal TUG (seconds) 11.09   No AD     Functional Gait  Assessment   Gait assessed  Yes    Gait Level Surface Walks 20 ft in less than 7 sec but greater than 5.5 sec, uses assistive device, slower speed, mild gait deviations, or deviates 6-10 in outside of the 12 in walkway width.   6.41s   Change in Gait Speed Able to change speed, demonstrates mild gait deviations, deviates 6-10 in outside of the 12 in walkway width, or no gait deviations, unable to achieve a major change in velocity, or uses a change in velocity, or uses an assistive device.    Gait with Horizontal Head Turns Performs head turns smoothly with no change in gait. Deviates no more than 6 in outside 12 in walkway width    Gait with Vertical Head Turns Performs head turns with no change in gait. Deviates no more than 6 in outside 12 in walkway width.    Gait and Pivot Turn Pivot turns safely within 3 sec and stops quickly with no loss of  balance.    Step Over Obstacle Is able to step over 2 stacked shoe boxes taped together (9 in total height) without changing gait speed. No evidence of imbalance.    Gait with Narrow Base of Support Ambulates 7-9 steps.    Gait with Eyes Closed Walks 20 ft, uses assistive device, slower speed, mild gait deviations, deviates 6-10 in outside 12 in walkway width. Ambulates 20 ft in less than 9 sec but greater than 7 sec.   7.81s   Ambulating Backwards Walks 20 ft, uses assistive device, slower speed, mild gait deviations, deviates 6-10 in outside 12 in walkway width.   14.1s   Steps Alternating feet, no rail.    Total Score  25    FGA comment: 25/30, low fall risk            PATIENT EDUCATION: Education details: Goal results, importance of continuing HEP, how to return to PT in future if needed  Person educated: Patient Education method: Explanation and Verbal cues Education comprehension: verbalized understanding  HOME EXERCISE PROGRAM: Access Code: [email or web address] URL: [email or web address] Date: 07/03/2024 Prepared by: Delon Pop  Exercises - Standing 3-Way Leg Reach with Resistance at Ankles and Counter Support  - 1 x daily - 7 x weekly - 3 sets - 10 reps - Bird Dog  - 1 x daily - 7 x weekly - 3 sets - 10 reps  GOALS: Goals reviewed with patient? Yes  SHORT TERM GOALS: = LTG based on PT POC length   LONG TERM GOALS: Target date: 08/02/24  Pt will be independent with final HEP for improved functional strength and mobility  Baseline: to be provided Goal status: MET  2.  Pt will improve 5x STS to </= 11 sec to demo improved functional LE strength and balance   Baseline: 13.34s B UE; 10.21s w/BUE (9/24) Goal status: MET  3.  Pt will improve gait speed to >/= .66m/s to demonstrate improved community ambulation  Baseline: .36m/s; 0.78 m/s no AD (9/24) Goal status: MET   4.  Pt will improve TUG to </= 13 secs to demonstrated reduced fall risk  Baseline:  17.97s; 11.09s (10/1) Goal status: MET  5.  Pt will improve FGA to >/= 25/30 to demonstrate improved balance and reduced fall risk  Baseline: 20/30; 25/30 (10/1) Goal status: MET   ASSESSMENT:  CLINICAL IMPRESSION: Emphasis of skilled PT session on LTG assessment and DC from PT. Pt has met 5/5 LTGs, w/significant improvements noted in her gait speed as noted by TUG and improvement on FGA. Pt also improved her time on 5x STS, indicative of improved BLE strength. Pt continues to be challenged by tandem gait but had no overt LOB during session. Pt in agreement to DC this date due to returning to baseline function and meeting stated rehab goals.   OBJECTIVE IMPAIRMENTS: Abnormal gait, cardiopulmonary status limiting activity, decreased coordination, decreased knowledge of condition, decreased strength, and impaired UE functional use.   ACTIVITY LIMITATIONS: carrying, lifting, bending, stairs, transfers, bed mobility, hygiene/grooming, locomotion level, and caring for others  PARTICIPATION LIMITATIONS: meal prep, cleaning, laundry, interpersonal relationship, driving, shopping, community activity, and occupation  PERSONAL FACTORS: Fitness, Past/current experiences, Profession, Time since onset of injury/illness/exacerbation, Transportation, and 3+ comorbidities: see above are also affecting patient's functional outcome.   REHAB POTENTIAL: Good  CLINICAL DECISION MAKING: Stable/uncomplicated  EVALUATION COMPLEXITY: Low  PLAN:  PT FREQUENCY: 1x/week  PT DURATION: 4 weeks  PLANNED INTERVENTIONS: 97164- PT Re-evaluation, 97750- Physical Performance Testing, 97110-Therapeutic exercises, 97530- Therapeutic activity, [id number]- Neuromuscular re-education, 97535- Self Care, 02859- Manual therapy, [id number]- Gait training, [phone]- Orthotic Initial, [phone]- Orthotic/Prosthetic subsequent, [phone]- Canalith repositioning, [phone]- Aquatic Therapy, [phone]- Electrical stimulation (manual), Patient/Family education,  Balance training, Stair training, Vestibular training, Visual/preceptual remediation/compensation, and DME instructions   Marlon BRAVO Tiffannie Sloss, PT, DPT  07/24/2024, 3:43 PM

## 2024-07-31 ENCOUNTER — Ambulatory Visit: Admitting: Physical Therapy

## 2024-08-04 NOTE — Progress Notes (Unsigned)
 [facility]:    Date:  08/07/2024   ID:  Allison Wilson, DOB [birth year], MRN [id number]  PCP:  Sherre Geni LABOR, NP  Cardiologist:  New - Dr. Francyne (DOD) Click to update primary MD,subspecialty MD or APP then REFRESH:1}    Referring MD: No ref. provider found   Chief Complaint: review monitor after recent stroke  History of Present Illness:    Allison Wilson is a 43 y.o. female with a history of recent CVA in 05/2024, hypertension, hyperlipidemia, anemia, hepatitis A, and HIV who presents today as new patient in the [facility] for review of monitor after recent stroke.   Patient was recently admitted in 05/2024 for a acute stroke after presenting with complaints of left sided numbness and weakness. Brain MRI showed an acute right basal ganglia infarct. Head/ neck CTA showed no large vessel occlusion. TTE showed LVEF of 60-65% with mild asymmetric LVH of the septal segment, normal RV function, and no significant valvular disease. TEE showed no evidence of left atrial/ left atrial appendage thrombus and negative bubble study for interatrial shunt. The agitated saline contrast bubble study was positive with shunting observed after >6 cardiac cycles suggestive of intrapulmonary shunting. Transcranial doppler bubble study was negative. She was seen by Neurology and recommended DAPT with Aspirin  and Plavix  for 3 weeks and then Aspirin  monotherapy. Cardiology was asked to arrange outpatient monitor at discharge.  She presents today for follow-up. She has not worn the monitor yet because she did not know how to apply it. She brought this in with her to the office today.  Overall, she is doing well since recent discharge.  She has completed home health PT and states this worked well.  She denies any residual left-sided numbness or weakness.  She does report some dizziness when walking at times but not all the times. She states this seems to occur after she takes some of her  medications. NO palpitaiton or near syncope/ syncope. No other cardiac complaints. No chest pain, shortness of breath, orthopnea, PND, or edema.   She if from [place]. She is not sure if she has any family history of heart disease. She denies any history of tobacco, alcohol, or drug use.  EKGs/Labs/Other Studies Reviewed:    The following studies were reviewed:  TTE 06/17/2024: Impressions: 1. Left ventricular ejection fraction, by estimation, is 60 to 65%. The  left ventricle has normal function. The left ventricle has no regional  wall motion abnormalities. There is mild asymmetric left ventricular  hypertrophy of the septal segment. Left  ventricular diastolic parameters are indeterminate.   2. Right ventricular systolic function is normal. The right ventricular  size is normal. There is normal pulmonary artery systolic pressure. The  estimated right ventricular systolic pressure is 10.1 mmHg.   3. The mitral valve is normal in structure. No evidence of mitral valve  regurgitation. No evidence of mitral stenosis.   4. The aortic valve is normal in structure. Aortic valve regurgitation is  not visualized. No aortic stenosis is present.   5. The inferior vena cava is normal in size with greater than 50%  respiratory variability, suggesting right atrial pressure of 3 mmHg.   Conclusion(s)/Recommendation(s): No intracardiac source of embolism  detected on this transthoracic study. Consider a transesophageal  echocardiogram to exclude cardiac source of embolism if clinically  indicated.  _______________  Transcranial Doppler 06/17/2024: Summary: No HITS at rest or during Valsalva. Negative transcranial Doppler Bubble  study with no  evidence of right to left intracardiac communication.    A vascular evaluation was performed. The left PCA was studied. An IV was  inserted into the patient's left upper arm. Verbal informed consent was  obtained.    Negative TCd Bubble study   _______________  TEE 06/18/2024: Impressions: 1. Left ventricular ejection fraction, by estimation, is 60 to 65%. The  left ventricle has normal function. There is mild asymmetric left  ventricular hypertrophy of the basal-septal segment.   2. Right ventricular systolic function is normal. The right ventricular  size is normal.   3. No left atrial/left atrial appendage thrombus was detected.   4. The mitral valve is normal in structure. No evidence of mitral valve  regurgitation.   5. The aortic valve is tricuspid. Aortic valve regurgitation is not  visualized. No aortic stenosis is present.   6. Agitated saline contrast bubble study was positive with shunting  observed after >6 cardiac cycles suggestive of intrapulmonary shunting.   Conclusion(s)/Recommendation(s): No LA/LAA thrombus identified. Negative  bubble study for interatrial shunt. No intracardiac source of embolism  detected on this on this transesophageal echocardiogram.   EKG:  EKG not ordered today.   Recent Labs: 06/17/2024: ALT 13 06/18/2024: BUN 11; Creatinine, Ser 0.83; Hemoglobin 13.6; Platelets 214; Potassium 3.8; Sodium 137  Recent Lipid Panel    Component Value Date/Time   CHOL 165 06/17/2024 0223   TRIG 53 06/17/2024 0223   HDL 59 06/17/2024 0223   CHOLHDL 2.8 06/17/2024 0223   VLDL 11 06/17/2024 0223   LDLCALC 95 06/17/2024 0223   LDLCALC 149 (H) 08/08/2023 1600    Physical Exam:    Vital Signs: BP 124/78 (BP Location: Left Arm, Patient Position: Sitting, Cuff Size: Normal)   Pulse 92   Ht 5' 5 (1.651 m)   Wt 183 lb 6.4 oz (83.2 kg)   SpO2 97%   BMI 30.52 kg/m     Wt Readings from Last 3 Encounters:  08/07/24 183 lb 6.4 oz (83.2 kg)  06/17/24 (P) 183 lb 3.2 oz (83.1 kg)  02/14/24 185 lb (83.9 kg)     General: 43 y.o. female in no acute distress. HEENT: Normocephalic and atraumatic. Sclera clear.  Neck: Supple. No carotid bruits. No JVD. Heart: RRR. Distinct S1 and S2. No murmurs,  gallops, or rubs.  Lungs: No increased work of breathing. Clear to ausculation bilaterally. No wheezes, rhonchi, or rales.  Extremities: No lower extremity edema.  Skin: Warm and dry. Neuro: No focal deficits. Psych: Normal affect. Responds appropriately.  Assessment:    1. Recent CVA   2. Primary hypertension   3. Hyperlipidemia, unspecified hyperlipidemia type     Plan:    Recent CVA Patient was recently admitted in 05/2024 for a stroke. Brain MRI showed an acute right basal ganglia infarct. TTE showed normal LV function and no significant valvular disease. TEE showed showed no evidence of left atrial/ left atrial appendage thrombus and negative bubble study for interatrial shunt. Transcranial doppler bubble study was negative. Outpatient event monitor was ordered. - Recovering well. She reports a little dizziness with walking at times but no residual numbness/ weakness.  - She completed DAPT with Aspirin  and Plavix  for 3 weeks and is no on Aspirin  monotherapy.  - Continue Lipitor 80mg  daily.  - She has not worn the event monitor yet because she did not know how to put this on. Our staff will help apply this today.  Hypertension BP well controlled.  - Continue current medications:  Lisinopril -HCTZ 20-12.5mg  daily and Coreg  6.25mg  twice daily.   Hyperlipidemia Lipid panel in 05/2024 during recent admission for stroke: Total Cholesterol 165, Triglycerides 53, HDL 59, LDL 95. LDL goal <70 given CAD.  - She was started on Lipitor 80mg  daily at time of stroke. Continue.  - This can be managed and followed by PCP. She is scheduled to see her PCP later this month.  Disposition: Follow up with me in 2-3 months to go over monitor results. If monitor is unremarkable and does not show any signs of atrial fibrillation/ flutter, she can likely just follow-up with us  as needed after next visit.   Signed, Aline FORBES Door, PA-C  08/07/2024 11:39 AM    [facility]

## 2024-08-07 ENCOUNTER — Ambulatory Visit: Attending: Student | Admitting: Student

## 2024-08-07 ENCOUNTER — Encounter: Payer: Self-pay | Admitting: Student

## 2024-08-07 VITALS — BP 124/78 | HR 92 | Ht 65.0 in | Wt 183.4 lb

## 2024-08-07 DIAGNOSIS — Z8673 Personal history of transient ischemic attack (TIA), and cerebral infarction without residual deficits: Secondary | ICD-10-CM | POA: Diagnosis not present

## 2024-08-07 DIAGNOSIS — E785 Hyperlipidemia, unspecified: Secondary | ICD-10-CM | POA: Diagnosis not present

## 2024-08-07 DIAGNOSIS — I10 Essential (primary) hypertension: Secondary | ICD-10-CM

## 2024-08-07 NOTE — Patient Instructions (Addendum)
 Thank you for choosing [facility]!     Medication Instructions:  No medication changes were made during today's visit.   *If you need a refill on your cardiac medications before your next appointment, please call your pharmacy*   Lab Work: No labs were ordered during today's visit.  If you have labs (blood work) drawn today and your tests are completely normal, you will receive your results only by: MyChart Message (if you have MyChart) OR A paper copy in the mail If you have any lab test that is abnormal or we need to change your treatment, we will call you to review the results.   Testing/Procedures: No procedures were ordered during today's visit.   Your next appointment:   3-4 month(s)   Provider:   Aline Door     Follow-Up: At [facility], you and your health needs are our priority.  As part of our continuing mission to provide you with exceptional heart care, we have created designated Provider Care Teams.  These Care Teams include your primary Cardiologist (physician) and Advanced Practice Providers (APPs -  Physician Assistants and Nurse Practitioners) who all work together to provide you with the care you need, when you need it. We recommend signing up for the patient portal called MyChart.  Sign up information is provided on this After Visit Summary.  MyChart is used to connect with patients for Virtual Visits (Telemedicine).  Patients are able to view lab/test results, encounter notes, upcoming appointments, etc.  Non-urgent messages can be sent to your provider as well.   To learn more about what you can do with MyChart, go to [email or web address].

## 2024-08-15 ENCOUNTER — Encounter: Payer: Self-pay | Admitting: Internal Medicine

## 2024-08-15 ENCOUNTER — Other Ambulatory Visit (HOSPITAL_COMMUNITY): Payer: Self-pay

## 2024-08-15 ENCOUNTER — Ambulatory Visit: Admitting: Internal Medicine

## 2024-08-15 ENCOUNTER — Other Ambulatory Visit: Payer: Self-pay

## 2024-08-15 VITALS — BP 118/78 | HR 76 | Temp 98.6°F | Ht 64.5 in | Wt 180.0 lb

## 2024-08-15 DIAGNOSIS — B20 Human immunodeficiency virus [HIV] disease: Secondary | ICD-10-CM

## 2024-08-15 DIAGNOSIS — Z1231 Encounter for screening mammogram for malignant neoplasm of breast: Secondary | ICD-10-CM

## 2024-08-15 MED ORDER — BICTEGRAVIR-EMTRICITAB-TENOFOV 50-200-25 MG PO TABS
1.0000 | ORAL_TABLET | Freq: Every day | ORAL | 11 refills | Status: AC
  Filled 2024-08-15 – 2024-08-19 (×2): qty 30, 30d supply, fill #0
  Filled 2024-09-12: qty 30, 30d supply, fill #1

## 2024-08-15 NOTE — Progress Notes (Signed)
 [facility]     HPI: Allison Wilson is a 42 y.o.  female presents for HIV management. Today 08/15/24: stroke in August-> no residual weakness. No missed doses biktarvy .no tsexaully active since lv.  Date of diagnosis: 2007 ART exposure: norvir+reyataz+ turvada-> truvada-tivicay ->biktarvy  Cd4 nadir 230 Past Ois disseminated mac? Risk factors: Possible blood transfusion serra leone Sexaully active with husband(HIV+)   Social: Occupation: housekeeping Housing: house with husband Support: Understanding of HIV: Etoh/drug/tobacco use: No/no/no  Past Medical History:  Diagnosis Date   Anemia    Hepatitis A    HIV (human immunodeficiency virus infection) (HCC)    Hypertension     Past Surgical History:  Procedure Laterality Date   TRANSESOPHAGEAL ECHOCARDIOGRAM (CATH LAB) N/A 06/18/2024   Procedure: TRANSESOPHAGEAL ECHOCARDIOGRAM;  Surgeon: Mona Vinie BROCKS, MD;  Location: [facility] INVASIVE CV LAB;  Service: Cardiovascular;  Laterality: N/A;    No family history on file. Current Outpatient Medications on File Prior to Visit  Medication Sig Dispense Refill   amLODipine  (NORVASC ) 5 MG tablet Take 5 mg by mouth daily.     aspirin  EC 81 MG tablet Take 1 tablet (81 mg total) by mouth daily. Swallow whole. 90 tablet 0   atorvastatin  (LIPITOR) 80 MG tablet Take 1 tablet (80 mg total) by mouth daily. 30 tablet 0   bictegravir-emtricitabine -tenofovir  AF (BIKTARVY ) 50-200-25 MG TABS tablet Take 1 tablet by mouth daily. 30 tablet 11   carvedilol  (COREG ) 6.25 MG tablet Take 6.25 mg by mouth 2 (two) times daily with a meal.     FEROSUL 325 (65 Fe) MG tablet Take 325 mg by mouth daily.     lisinopril -hydrochlorothiazide (ZESTORETIC ) 20-12.5 MG tablet Take 1 tablet by mouth daily.     pantoprazole  (PROTONIX ) 40 MG tablet Take 1 tablet (40 mg total) by mouth daily. 30 tablet 0   Vitamin D , Ergocalciferol , (DRISDOL ) 1.25 MG (50000 UNIT) CAPS capsule Take 50,000  Units by mouth 2 (two) times a week.     No current facility-administered medications on file prior to visit.    No Known Allergies    Lab Results HIV 1 RNA Quant  Date Value  02/14/2024 NOT DETECTED copies/mL  08/08/2023 Not Detected Copies/mL  01/18/2023 <20 Copies/mL (H)   CD4 T Cell Abs (/uL)  Date Value  08/08/2023 475  01/18/2023 492  07/06/2022 417   No results found for: HIV1GENOSEQ Lab Results  Component Value Date   WBC 4.4 06/18/2024   HGB 13.6 06/18/2024   HCT 42.0 06/18/2024   MCV 85.7 06/18/2024   PLT 214 06/18/2024    Lab Results  Component Value Date   CREATININE 0.83 06/18/2024   BUN 11 06/18/2024   NA 137 06/18/2024   K 3.8 06/18/2024   CL 105 06/18/2024   CO2 24 06/18/2024   Lab Results  Component Value Date   ALT 13 06/17/2024   AST 17 06/17/2024   ALKPHOS 71 06/17/2024   BILITOT 0.5 06/17/2024    Lab Results  Component Value Date   CHOL 165 06/17/2024   TRIG 53 06/17/2024   HDL 59 06/17/2024   LDLCALC 95 06/17/2024   Lab Results  Component Value Date   HAV POS (A) 03/01/2010   Lab Results  Component Value Date   HEPBSAG Negative 11/27/2015   HEPBSAB Reactive 11/27/2015   Lab Results  Component Value Date   HCVAB NO 12/18/2006   Lab Results  Component Value Date   CHLAMYDIAWP  Negative 05/25/2021   N Negative 05/25/2021   No results found for: GCPROBEAPT No results found for: QUANTGOLD  Assessment/Plan #HIV -CD4 475, Vlnd , on 02/14/24 -biktarvy  -hiv lab -f/u in 6 months     #Vaccination COVID utd Flu utd Monkeypox PCV 2023 Meningitis x2 HepA immune HEpB immune Tdap 2025 Shingles   #Health maintenance -Quantiferon positve on 7/32/25-> hx of latent tb(s/p treatment in 2007 for 9 months) -> no longer order IGRA -RPR nr 08/08/23 -HCV nr 02/14/24 -GC nr 02/14/24 -Lipid The 10-year ASCVD risk score (Arnett DK, et al., 2019) is: 3.9%   Values used to calculate the score:     Age: 73 years     Sex:  Female     Is Non-Hispanic African American: Yes     Diabetic: No     Tobacco smoker: No     Systolic Blood Pressure: 154 mmHg     Is BP treated: Yes     HDL Cholesterol: 66 mg/dL     Total Cholesterol: 231 mg/dL    On atorvastatin ->06/17/24 -Dysplasia screen F-follow PCP, last on 2-3 years ago -Mammogram -needs -Colonoscopy      Loney Stank, MD [facility] I have personally spent 50 minutes involved in face-to-face and non-face-to-face activities for this patient on the day of the visit. Professional time spent includes the following activities: Preparing to see the patient (review of tests), Obtaining and/or reviewing separately obtained history (admission/discharge record), Performing a medically appropriate examination and/or evaluation , Ordering medications/tests/procedures, referring and communicating with other health care professionals, Documenting clinical information in the EMR, Independently interpreting results (not separately reported), Communicating results to the patient/family/caregiver, Counseling and educating the patient/family/caregiver and Care coordination (not separately reported).

## 2024-08-16 ENCOUNTER — Other Ambulatory Visit: Payer: Self-pay

## 2024-08-16 LAB — T-HELPER CELLS (CD4) COUNT (NOT AT ARMC)
CD4 % Helper T Cell: 35 % (ref 33–65)
CD4 T Cell Abs: 499 /uL (ref 400–1790)

## 2024-08-17 LAB — COMPLETE METABOLIC PANEL WITHOUT GFR
AG Ratio: 1.1 (calc) (ref 1.0–2.5)
ALT: 12 U/L (ref 6–29)
AST: 14 U/L (ref 10–30)
Albumin: 4.1 g/dL (ref 3.6–5.1)
Alkaline phosphatase (APISO): 79 U/L (ref 31–125)
BUN: 12 mg/dL (ref 7–25)
CO2: 26 mmol/L (ref 20–32)
Calcium: 9.3 mg/dL (ref 8.6–10.2)
Chloride: 102 mmol/L (ref 98–110)
Creat: 0.83 mg/dL (ref 0.50–0.99)
Globulin: 3.8 g/dL — ABNORMAL HIGH (ref 1.9–3.7)
Glucose, Bld: 77 mg/dL (ref 65–99)
Potassium: 3.8 mmol/L (ref 3.5–5.3)
Sodium: 135 mmol/L (ref 135–146)
Total Bilirubin: 0.3 mg/dL (ref 0.2–1.2)
Total Protein: 7.9 g/dL (ref 6.1–8.1)

## 2024-08-17 LAB — HIV-1 RNA QUANT-NO REFLEX-BLD
HIV 1 RNA Quant: NOT DETECTED {copies}/mL
HIV-1 RNA Quant, Log: NOT DETECTED {Log_copies}/mL

## 2024-08-17 LAB — CBC WITH DIFFERENTIAL/PLATELET
Absolute Lymphocytes: 1432 {cells}/uL (ref 850–3900)
Absolute Monocytes: 667 {cells}/uL (ref 200–950)
Basophils Absolute: 30 {cells}/uL (ref 0–200)
Basophils Relative: 0.7 %
Eosinophils Absolute: 129 {cells}/uL (ref 15–500)
Eosinophils Relative: 3 %
HCT: 37 % (ref 35.0–45.0)
Hemoglobin: 12 g/dL (ref 11.7–15.5)
MCH: 27.4 pg (ref 27.0–33.0)
MCHC: 32.4 g/dL (ref 32.0–36.0)
MCV: 84.5 fL (ref 80.0–100.0)
MPV: 10.2 fL (ref 7.5–12.5)
Monocytes Relative: 15.5 %
Neutro Abs: 2043 {cells}/uL (ref 1500–7800)
Neutrophils Relative %: 47.5 %
Platelets: 257 Thousand/uL (ref 140–400)
RBC: 4.38 Million/uL (ref 3.80–5.10)
RDW: 12.9 % (ref 11.0–15.0)
Total Lymphocyte: 33.3 %
WBC: 4.3 Thousand/uL (ref 3.8–10.8)

## 2024-08-19 ENCOUNTER — Other Ambulatory Visit: Payer: Self-pay

## 2024-08-19 NOTE — Progress Notes (Signed)
 Specialty Pharmacy Refill Coordination Note  Uma Larranaga is a 43 y.o. female contacted today regarding refills of specialty medication(s) Bictegravir-Emtricitab-Tenofov (BIKTARVY )   Patient requested Delivery   Delivery date: 08/21/24   Verified address: [address]   [name] [address]   Medication will be filled on: 08/20/24

## 2024-09-03 ENCOUNTER — Other Ambulatory Visit: Payer: Self-pay

## 2024-09-10 ENCOUNTER — Ambulatory Visit: Attending: Cardiology

## 2024-09-10 DIAGNOSIS — I639 Cerebral infarction, unspecified: Secondary | ICD-10-CM

## 2024-09-12 ENCOUNTER — Other Ambulatory Visit (HOSPITAL_COMMUNITY): Payer: Self-pay

## 2024-09-16 ENCOUNTER — Other Ambulatory Visit: Payer: Self-pay

## 2024-09-18 ENCOUNTER — Other Ambulatory Visit: Payer: Self-pay

## 2024-09-19 DIAGNOSIS — I639 Cerebral infarction, unspecified: Secondary | ICD-10-CM | POA: Diagnosis not present

## 2024-09-23 ENCOUNTER — Ambulatory Visit: Payer: Self-pay | Admitting: Cardiology

## 2024-10-10 NOTE — Telephone Encounter (Signed)
 Results/message from Dr. Emmette Harms has been released to MyChart. A letter is being sent to the last known home address.

## 2025-07-03 ENCOUNTER — Ambulatory Visit: Admitting: Internal Medicine
